# Patient Record
Sex: Female | Born: 1995 | Race: Black or African American | Hispanic: No | Marital: Single | Smoking: Never smoker
Health system: Southern US, Community
[De-identification: ages and names within clinical notes are randomized; demographics above are authoritative.]

## PROBLEM LIST (undated history)

## (undated) DIAGNOSIS — J439 Emphysema, unspecified: Secondary | ICD-10-CM

## (undated) DIAGNOSIS — N939 Abnormal uterine and vaginal bleeding, unspecified: Secondary | ICD-10-CM

## (undated) DIAGNOSIS — T7840XA Allergy, unspecified, initial encounter: Secondary | ICD-10-CM

## (undated) DIAGNOSIS — D696 Thrombocytopenia, unspecified: Secondary | ICD-10-CM

## (undated) DIAGNOSIS — Z87898 Personal history of other specified conditions: Secondary | ICD-10-CM

## (undated) DIAGNOSIS — J45909 Unspecified asthma, uncomplicated: Secondary | ICD-10-CM

## (undated) DIAGNOSIS — J449 Chronic obstructive pulmonary disease, unspecified: Secondary | ICD-10-CM

## (undated) DIAGNOSIS — H9190 Unspecified hearing loss, unspecified ear: Secondary | ICD-10-CM

## (undated) DIAGNOSIS — Z8774 Personal history of (corrected) congenital malformations of heart and circulatory system: Secondary | ICD-10-CM

## (undated) DIAGNOSIS — Q211 Atrial septal defect, unspecified: Secondary | ICD-10-CM

## (undated) DIAGNOSIS — Q2549 Other congenital malformations of aorta: Secondary | ICD-10-CM

## (undated) DIAGNOSIS — I272 Pulmonary hypertension, unspecified: Secondary | ICD-10-CM

## (undated) DIAGNOSIS — R7303 Prediabetes: Secondary | ICD-10-CM

## (undated) DIAGNOSIS — Q210 Ventricular septal defect: Secondary | ICD-10-CM

## (undated) DIAGNOSIS — J984 Other disorders of lung: Secondary | ICD-10-CM

## (undated) DIAGNOSIS — D6942 Congenital and hereditary thrombocytopenia purpura: Secondary | ICD-10-CM

## (undated) DIAGNOSIS — Z5189 Encounter for other specified aftercare: Secondary | ICD-10-CM

## (undated) DIAGNOSIS — Q2543 Congenital aneurysm of aorta: Secondary | ICD-10-CM

## (undated) DIAGNOSIS — F819 Developmental disorder of scholastic skills, unspecified: Secondary | ICD-10-CM

## (undated) HISTORY — DX: Prediabetes: R73.03

## (undated) HISTORY — PX: PATENT DUCTUS ARTERIOUS REPAIR: SHX269

## (undated) HISTORY — DX: Encounter for other specified aftercare: Z51.89

## (undated) HISTORY — DX: Abnormal uterine and vaginal bleeding, unspecified: N93.9

## (undated) HISTORY — PX: VSD REPAIR: SHX276

## (undated) HISTORY — PX: CARDIAC SURGERY: SHX584

## (undated) HISTORY — DX: Congenital and hereditary thrombocytopenia purpura: D69.42

## (undated) HISTORY — DX: Other disorders of lung: J98.4

## (undated) HISTORY — DX: Personal history of other specified conditions: Z87.898

## (undated) HISTORY — PX: EYE SURGERY: SHX253

---
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
## 2014-04-20 LAB — PULMONARY FUNCTION TEST

## 2015-02-14 ENCOUNTER — Emergency Department (HOSPITAL_COMMUNITY)
Admission: EM | Admit: 2015-02-14 | Discharge: 2015-02-14 | Disposition: A | Discharge status: Home / Self Care | Payer: Self-pay | Attending: Emergency Medicine | Admitting: Emergency Medicine

## 2015-02-14 ENCOUNTER — Emergency Department (HOSPITAL_COMMUNITY)
Admission: EM | Admit: 2015-02-14 | Discharge: 2015-02-14 | Discharge status: Against Medical Advice | Payer: Self-pay | Attending: Emergency Medicine | Admitting: Emergency Medicine

## 2015-02-14 ENCOUNTER — Encounter (HOSPITAL_COMMUNITY): Payer: Self-pay | Admitting: *Deleted

## 2015-02-14 ENCOUNTER — Emergency Department (HOSPITAL_COMMUNITY): Payer: Self-pay

## 2015-02-14 ENCOUNTER — Encounter (HOSPITAL_COMMUNITY): Payer: Self-pay

## 2015-02-14 DIAGNOSIS — R0981 Nasal congestion: Secondary | ICD-10-CM | POA: Insufficient documentation

## 2015-02-14 DIAGNOSIS — R05 Cough: Secondary | ICD-10-CM | POA: Insufficient documentation

## 2015-02-14 DIAGNOSIS — J449 Chronic obstructive pulmonary disease, unspecified: Secondary | ICD-10-CM | POA: Insufficient documentation

## 2015-02-14 DIAGNOSIS — Z79818 Long term (current) use of other agents affecting estrogen receptors and estrogen levels: Secondary | ICD-10-CM | POA: Insufficient documentation

## 2015-02-14 DIAGNOSIS — Z79899 Other long term (current) drug therapy: Secondary | ICD-10-CM | POA: Insufficient documentation

## 2015-02-14 DIAGNOSIS — Z862 Personal history of diseases of the blood and blood-forming organs and certain disorders involving the immune mechanism: Secondary | ICD-10-CM | POA: Insufficient documentation

## 2015-02-14 DIAGNOSIS — J069 Acute upper respiratory infection, unspecified: Secondary | ICD-10-CM | POA: Insufficient documentation

## 2015-02-14 HISTORY — DX: Chronic obstructive pulmonary disease, unspecified: J44.9

## 2015-02-14 HISTORY — DX: Emphysema, unspecified: J43.9

## 2015-02-14 HISTORY — DX: Thrombocytopenia, unspecified: D69.6

## 2015-02-14 LAB — COMPREHENSIVE METABOLIC PANEL
ALK PHOS: 29 U/L — AB (ref 38–126)
ALT: 22 U/L (ref 14–54)
AST: 27 U/L (ref 15–41)
Albumin: 3.9 g/dL (ref 3.5–5.0)
Anion gap: 9 (ref 5–15)
BILIRUBIN TOTAL: 1.3 mg/dL — AB (ref 0.3–1.2)
BUN: 6 mg/dL (ref 6–20)
CHLORIDE: 102 mmol/L (ref 101–111)
CO2: 24 mmol/L (ref 22–32)
CREATININE: 0.79 mg/dL (ref 0.44–1.00)
Calcium: 9.5 mg/dL (ref 8.9–10.3)
GFR calc Af Amer: >60 mL/min (ref >60)
GLUCOSE: 92 mg/dL (ref 65–99)
Potassium: 4 mmol/L (ref 3.5–5.1)
Sodium: 135 mmol/L (ref 135–145)
Total Protein: 7.9 g/dL (ref 6.5–8.1)

## 2015-02-14 LAB — CBC
HEMATOCRIT: 40.2 % (ref 36.0–46.0)
HEMOGLOBIN: 13.3 g/dL (ref 12.0–15.0)
MCH: 30.4 pg (ref 26.0–34.0)
MCHC: 33.1 g/dL (ref 30.0–36.0)
MCV: 92 fL (ref 78.0–100.0)
Platelets: 92 10*3/uL — ABNORMAL LOW (ref 150–400)
RBC: 4.37 MIL/uL (ref 3.87–5.11)
RDW: 12.6 % (ref 11.5–15.5)
WBC: 9 10*3/uL (ref 4.0–10.5)

## 2015-02-14 MED ORDER — FLUTICASONE PROPIONATE 50 MCG/ACT NA SUSP: 2.0000 | Freq: Every day | NASAL | Status: DC

## 2015-02-14 MED ORDER — CETIRIZINE HCL 1 MG/ML PO SYRP: 2.5000 mg | ORAL_SOLUTION | Freq: Every day | ORAL | Status: DC

## 2015-02-14 MED ORDER — CETIRIZINE HCL 10 MG PO TABS: 10.0000 mg | ORAL_TABLET | Freq: Every day | ORAL | Status: DC

## 2015-02-14 NOTE — ED Notes (Signed)
Per mother pt was at [facility] today & left after an x ray today

## 2015-02-14 NOTE — ED Notes (Signed)
Pt states congestion/cough x 3 days.  Pt has tried benadryl, claritin.  Remains congested.  No fever.

## 2015-02-14 NOTE — ED Provider Notes (Signed)
Complains of cough, sneeze, rhinorrhea onset 3 days ago which is improving with time. She denies shortness of breath presently. Denies fever. No other associated symptoms  Orlie Dakin, MD 02/14/15 2107

## 2015-02-14 NOTE — ED Provider Notes (Signed)
CSN: [id number]     Arrival date & time 02/14/15  1726 History   First MD Initiated Contact with Patient 02/14/15 1917     Chief Complaint  Patient presents with  . Sore Throat  . Cough   Tammie Wade is a 19 y.o. female with a history of emphysema, COPD, thrombocytopenia and bronchopulmonary dysplasia who presents the emergency department with her mother and father complaining of cough, sore throat, postnasal drip, sneezing, and nasal congestion. She reports she has tried Claritin and Benadryl without relief. She reports some slight shortness of breath with coughing. She denies trouble swallowing. She reports she's been using her Spiriva as prescribed. She denies fevers, chills, appetite changes, abdominal pain, nausea, vomiting, diarrhea, double vision, ear pain, ear discharge, rashes, chest pain, palpitations, lightheadedness, dizziness or syncope.  (Consider location/radiation/quality/duration/timing/severity/associated sxs/prior Treatment) HPI  Past Medical History  Diagnosis Date  . Emphysema of lung ([place])   . COPD (chronic obstructive pulmonary disease) ([place])   . Thrombocytopenia ([place])   . Bronchopulmonary dysplasia    Past Surgical History  Procedure Laterality Date  . Cardiac surgery    . Vsd repair    . Eye surgery     No family history on file. Social History  Substance Use Topics  . Smoking status: Never Smoker   . Smokeless tobacco: None  . Alcohol Use: No   OB History    No data available     Review of Systems  Constitutional: Negative for fever, chills and appetite change.  HENT: Positive for congestion, postnasal drip, rhinorrhea, sinus pressure, sneezing and sore throat. Negative for ear discharge, ear pain, nosebleeds and trouble swallowing.   Eyes: Negative for pain, discharge, itching and visual disturbance.  Respiratory: Positive for cough. Negative for chest tightness and wheezing.   Cardiovascular: Negative for chest pain and palpitations.   Gastrointestinal: Negative for nausea, vomiting, abdominal pain and diarrhea.  Genitourinary: Negative for dysuria.  Musculoskeletal: Negative for back pain and neck pain.  Skin: Negative for rash.  Neurological: Negative for dizziness, syncope, weakness, light-headedness and numbness.      Allergies  Review of patient's allergies indicates no known allergies.  Home Medications   Prior to Admission medications   Medication Sig Start Date End Date Taking? Authorizing Provider  LOW-OGESTREL 0.3-30 MG-MCG tablet Take 1 tablet by mouth daily. 01/30/15  Yes Historical Provider, MD  omeprazole (PRILOSEC) 40 MG capsule Take 40 mg by mouth daily. 01/30/15  Yes Historical Provider, MD  SPIRIVA RESPIMAT 2.5 MCG/ACT AERS Inhale 2 puffs into the lungs daily. 01/31/15  Yes Historical Provider, MD  cetirizine (ZYRTEC ALLERGY) 10 MG tablet Take 1 tablet (10 mg total) by mouth daily. 02/14/15   Waynetta Pean, PA-C  fluticasone (FLONASE) 50 MCG/ACT nasal spray Place 2 sprays into both nostrils daily. 02/14/15   Waynetta Pean, PA-C   BP 101/68 mmHg  Pulse 107  Temp(Src) 98.6 F (37 C) (Oral)  Resp 18  Ht _0  (1.651 m)  Wt 130 lb (58.968 kg)  BMI 21.63 kg/m2  SpO2 98%  LMP 02/04/2015 Physical Exam  Constitutional: She appears well-developed and well-nourished. No distress.  Nontoxic appearing.  HENT:  Head: Normocephalic and atraumatic.  Right Ear: External ear normal.  Left Ear: External ear normal.  Mouth/Throat: Oropharynx is clear and moist.  Mild bilateral tonsillar hypertrophy without exudates. Uvula is midline without edema. No peritonsillar abscess. No posterior oropharyngeal edema. Boggy nasal turbinates bilaterally. Mild maxillary and frontal sinus tenderness to palpation. Bilateral tympanic membranes  are pearly-gray without erythema or loss of landmarks.   Eyes: Conjunctivae are normal. Pupils are equal, round, and reactive to light. Right eye exhibits no discharge. Left eye  exhibits no discharge.  Neck: Normal range of motion. Neck supple.  Cardiovascular: Normal rate, regular rhythm and intact distal pulses.  Exam reveals no gallop and no friction rub.   Pulmonary/Chest: Effort normal and breath sounds normal. No respiratory distress. She has no wheezes. She has no rales.  Lungs are clear to auscultation bilaterally.  Abdominal: Soft. She exhibits no distension. There is no tenderness. There is no guarding.  Musculoskeletal: She exhibits no edema or tenderness.  Lymphadenopathy:    She has no cervical adenopathy.  Neurological: She is alert. Coordination normal.  Skin: Skin is warm and dry. No rash noted. She is not diaphoretic. No erythema. No pallor.  Psychiatric: She has a normal mood and affect. Her behavior is normal.  Nursing note and vitals reviewed.   ED Course  Procedures (including critical care time) Labs Review Labs Reviewed  CBC - Abnormal; Notable for the following:    Platelets 92 (*)    All other components within normal limits  COMPREHENSIVE METABOLIC PANEL - Abnormal; Notable for the following:    Alkaline Phosphatase 29 (*)    Total Bilirubin 1.3 (*)    All other components within normal limits    Imaging Review Dg Chest 2 View  02/14/2015  CLINICAL DATA:  19 year old with 3 day history of cough and chest congestion unrelieved by over the counter medications. 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 EXAM: CHEST  2 VIEW COMPARISON:  None. FINDINGS: Cardiac silhouette upper normal in size with likely left atrial enlargement. Hilar and mediastinal contours otherwise unremarkable. Severe bullous changes throughout both lungs. No confluent airspace consolidation. No pleural effusions. No pneumothorax. Visualized bony thorax intact. IMPRESSION: 1.  No acute cardiopulmonary disease. 2. Severe bullous emphysematous changes throughout both lungs. 3. Upper normal heart size.  Query left atrial  enlargement. Electronically Signed   By: Evangeline Dakin M.D.   On: 02/14/2015 13:45   I have personally reviewed and evaluated these images and lab results as part of my medical decision-making.   EKG Interpretation None      Filed Vitals:   02/14/15 1858 02/14/15 1900 02/14/15 1915 02/14/15 2015  BP: 99/59 99/59 105/66 101/68  Pulse: 92 65 97 107  Temp:      TempSrc:      Resp: 18     Height:      Weight:      SpO2: 100% 98% 98% 98%     MDM   Meds given in ED:  Medications - No data to display  New Prescriptions   CETIRIZINE (ZYRTEC ALLERGY) 10 MG TABLET    Take 1 tablet (10 mg total) by mouth daily.   FLUTICASONE (FLONASE) 50 MCG/ACT NASAL SPRAY    Place 2 sprays into both nostrils daily.    Final diagnoses:  URI (upper respiratory infection)   This is a 19 y.o. female with a history of emphysema, COPD, thrombocytopenia and bronchopulmonary dysplasia who presents the emergency department with her mother and father complaining of cough, sore throat, postnasal drip, sneezing, and nasal congestion. She reports she has tried Claritin and Benadryl without relief. She reports some slight shortness of breath with coughing. She denies trouble swallowing. She reports she's been using her Spiriva as prescribed. On exam the patient is afebrile nontoxic appearing. Her lungs  are clear to auscultation bilaterally. Patient has boggy nasal turbinates bilaterally. She is not tonsillar protrude without exudates. CBC shows thrombocytopenia. This is chronic and old. CMP is unremarkable. Chest x-ray showed no acute cardiopulmonary disease. It did show eczematous changes throughout with sedation has a history of. Patient appears to have an upper respiratory infection. We will discharge the projections for cetirizine and fluticasone nasal spray. I encouraged close follow-up by primary care and advised strict return precautions. I advised the patient to follow-up with their primary care provider this  week. I advised the patient to return to the emergency department with new or worsening symptoms or new concerns. The patient verbalized understanding and agreement with plan.    This patient was discussed with and evaluated by Dr. Winfred Leeds who agrees with assessment and plan.    Waynetta Pean, PA-C 02/14/15 2114  Orlie Dakin, MD 02/15/15 [id number]

## 2015-02-14 NOTE — Discharge Instructions (Signed)
Upper Respiratory Infection, Adult Most upper respiratory infections (URIs) are a viral infection of the air passages leading to the lungs. A URI affects the nose, throat, and upper air passages. The most common type of URI is nasopharyngitis and is typically referred to as "the common cold." URIs run their course and usually go away on their own. Most of the time, a URI does not require medical attention, but sometimes a bacterial infection in the upper airways can follow a viral infection. This is called a secondary infection. Sinus and middle ear infections are common types of secondary upper respiratory infections. Bacterial pneumonia can also complicate a URI. A URI can worsen asthma and chronic obstructive pulmonary disease (COPD). Sometimes, these complications can require emergency medical care and may be life threatening.  CAUSES Almost all URIs are caused by viruses. A virus is a type of germ and can spread from one person to another.  RISKS FACTORS You may be at risk for a URI if:   You smoke.   You have chronic heart or lung disease.  You have a weakened defense (immune) system.   You are very young or very old.   You have nasal allergies or asthma.  You work in crowded or poorly ventilated areas.  You work in health care facilities or schools. SIGNS AND SYMPTOMS  Symptoms typically develop 2-3 days after you come in contact with a cold virus. Most viral URIs last 7-10 days. However, viral URIs from the influenza virus (flu virus) can last 14-18 days and are typically more severe. Symptoms may include:   Runny or stuffy (congested) nose.   Sneezing.   Cough.   Sore throat.   Headache.   Fatigue.   Fever.   Loss of appetite.   Pain in your forehead, behind your eyes, and over your cheekbones (sinus pain).  Muscle aches.  DIAGNOSIS  Your health care provider may diagnose a URI by:  Physical exam.  Tests to check that your symptoms are not due to  another condition such as:  Strep throat.  Sinusitis.  Pneumonia.  Asthma. TREATMENT  A URI goes away on its own with time. It cannot be cured with medicines, but medicines may be prescribed or recommended to relieve symptoms. Medicines may help:  Reduce your fever.  Reduce your cough.  Relieve nasal congestion. HOME CARE INSTRUCTIONS   Take medicines only as directed by your health care provider.   Gargle warm saltwater or take cough drops to comfort your throat as directed by your health care provider.  Use a warm mist humidifier or inhale steam from a shower to increase air moisture. This may make it easier to breathe.  Drink enough fluid to keep your urine clear or pale yellow.   Eat soups and other clear broths and maintain good nutrition.   Rest as needed.   Return to work when your temperature has returned to normal or as your health care provider advises. You may need to stay home longer to avoid infecting others. You can also use a face mask and careful hand washing to prevent spread of the virus.  Increase the usage of your inhaler if you have asthma.   Do not use any tobacco products, including cigarettes, chewing tobacco, or electronic cigarettes. If you need help quitting, ask your health care provider. PREVENTION  The best way to protect yourself from getting a cold is to practice good hygiene.   Avoid oral or hand contact with people with cold  symptoms.   Wash your hands often if contact occurs.  There is no clear evidence that vitamin C, vitamin E, echinacea, or exercise reduces the chance of developing a cold. However, it is always recommended to get plenty of rest, exercise, and practice good nutrition.  SEEK MEDICAL CARE IF:   You are getting worse rather than better.   Your symptoms are not controlled by medicine.   You have chills.  You have worsening shortness of breath.  You have brown or red mucus.  You have yellow or brown nasal  discharge.  You have pain in your face, especially when you bend forward.  You have a fever.  You have swollen neck glands.  You have pain while swallowing.  You have Detjen areas in the back of your throat. SEEK IMMEDIATE MEDICAL CARE IF:   You have severe or persistent:  Headache.  Ear pain.  Sinus pain.  Chest pain.  You have chronic lung disease and any of the following:  Wheezing.  Prolonged cough.  Coughing up blood.  A change in your usual mucus.  You have a stiff neck.  You have changes in your:  Vision.  Hearing.  Thinking.  Mood. MAKE SURE YOU:   Understand these instructions.  Will watch your condition.  Will get help right away if you are not doing well or get worse.   This information is not intended to replace advice given to you by your health care provider. Make sure you discuss any questions you have with your health care provider.   Document Released: 09/16/2000 Document Revised: 08/07/2014 Document Reviewed: 06/28/2013 Elsevier Interactive Patient Education 2016 [employer]. Allergic Rhinitis Allergic rhinitis is when the mucous membranes in the nose respond to allergens. Allergens are particles in the air that cause your body to have an allergic reaction. This causes you to release allergic antibodies. Through a chain of events, these eventually cause you to release histamine into the blood stream. Although meant to protect the body, it is this release of histamine that causes your discomfort, such as frequent sneezing, congestion, and an itchy, runny nose.  CAUSES Seasonal allergic rhinitis (hay fever) is caused by pollen allergens that may come from grasses, trees, and weeds. Year-round allergic rhinitis (perennial allergic rhinitis) is caused by allergens such as house dust mites, pet dander, and mold spores. SYMPTOMS  Nasal stuffiness (congestion).  Itchy, runny nose with sneezing and tearing of the eyes. DIAGNOSIS Your health  care provider can help you determine the allergen or allergens that trigger your symptoms. If you and your health care provider are unable to determine the allergen, skin or blood testing may be used. Your health care provider will diagnose your condition after taking your health history and performing a physical exam. Your health care provider may assess you for other related conditions, such as asthma, pink eye, or an ear infection. TREATMENT Allergic rhinitis does not have a cure, but it can be controlled by:  Medicines that block allergy symptoms. These may include allergy shots, nasal sprays, and oral antihistamines.  Avoiding the allergen. Hay fever may often be treated with antihistamines in pill or nasal spray forms. Antihistamines block the effects of histamine. There are over-the-counter medicines that may help with nasal congestion and swelling around the eyes. Check with your health care provider before taking or giving this medicine. If avoiding the allergen or the medicine prescribed do not work, there are many new medicines your health care provider can prescribe. Stronger medicine  may be used if initial measures are ineffective. Desensitizing injections can be used if medicine and avoidance does not work. Desensitization is when a patient is given ongoing shots until the body becomes less sensitive to the allergen. Make sure you follow up with your health care provider if problems continue. HOME CARE INSTRUCTIONS It is not possible to completely avoid allergens, but you can reduce your symptoms by taking steps to limit your exposure to them. It helps to know exactly what you are allergic to so that you can avoid your specific triggers. SEEK MEDICAL CARE IF:  You have a fever.  You develop a cough that does not stop easily (persistent).  You have shortness of breath.  You start wheezing.  Symptoms interfere with normal daily activities.   This information is not intended to  replace advice given to you by your health care provider. Make sure you discuss any questions you have with your health care provider.   Document Released: 12/16/2000 Document Revised: 04/13/2014 Document Reviewed: 11/28/2012 Elsevier Interactive Patient Education [employer].

## 2015-02-14 NOTE — ED Notes (Signed)
Called for pt.  Pt not found in lobby.  Per pt sitting next to her, pt went to another hospital and did not want to wait.

## 2015-06-27 ENCOUNTER — Telehealth: Payer: Self-pay | Admitting: Family Medicine

## 2015-06-27 NOTE — Telephone Encounter (Signed)
Records received from [facility] put on Vickie's desk

## 2015-06-28 ENCOUNTER — Encounter: Payer: Self-pay | Admitting: Family Medicine

## 2015-06-28 ENCOUNTER — Ambulatory Visit (INDEPENDENT_AMBULATORY_CARE_PROVIDER_SITE_OTHER): Payer: BLUE CROSS/BLUE SHIELD | Admitting: Family Medicine

## 2015-06-28 VITALS — BP 128/70 | HR 64 | Wt 145.4 lb

## 2015-06-28 DIAGNOSIS — Z7189 Other specified counseling: Secondary | ICD-10-CM

## 2015-06-28 DIAGNOSIS — Z3041 Encounter for surveillance of contraceptive pills: Secondary | ICD-10-CM | POA: Diagnosis not present

## 2015-06-28 DIAGNOSIS — Z7689 Persons encountering health services in other specified circumstances: Secondary | ICD-10-CM

## 2015-06-28 MED ORDER — LOW-OGESTREL 0.3-30 MG-MCG PO TABS: 1.0000 | ORAL_TABLET | Freq: Every day | ORAL | Status: DC

## 2015-06-28 NOTE — Progress Notes (Signed)
   Subjective:    Patient ID: [place], female    DOB: [birth year], 20 y.o.   MRN: [id number]  HPI Chief Complaint  Patient presents with  . new pt    get established  and get bcp refilled.   She is new to the practice and here to establish primary care. She is here with her mother. She moved here from [facility] in October 2016. No primary care since moving here. Prior to that she was living in [place] until 2014. She has been under the care of multiple specialist in past while she was living in [place] including a hematologist, gynecologist, pulmonologist, cardiologist, audiologist- hearing loss. She has been taking the same birth control pill for several years. States she needs to be on this to keep from bleeding so much due to history of thrombocytopenia. Has been on a steroid in past also but was discontinued in [facility].  Mother reports patient has complicated history including developmental delay. Mother is doing most of speaking but patient is involved in interview somewhat.   Also has history of Bronchial pulmonary displasia per mother.  Diagnosed with COPD - emphysema last year- Dr Janace Hoard in [facility] diagnosed last year. Saw Pulmonologist last year  History of thrombocytopenia.  Patient has appt for CPE and labs next week and today mother states she is here to refill her OCP so that she does not start her menstrual cycle.  Some of her medical records have arrived and will be further reviewed.   She has no other concerns today. Denies fever, chills, nausea, vomiting, diarrhea, abnormal bleeding, urinary symptoms.   Review of Systems Pertinent positives and negatives in the history of present illness.     Objective:   Physical Exam BP 128/70 mmHg  Pulse 64  Wt 145 lb 6.4 oz (65.953 kg)  LMP 06/23/2015  Alert and oriented and in no acute distress. Not otherwise examined.       Assessment & Plan:  Encounter for repeat prescription of oral contraceptives - Plan: LOW-OGESTREL 0.3-30 MG-MCG  tablet  Encounter to establish care  Refilled OCP per request of mother and patient. No other concerns today. Will need to review medical records and have her return next week for CPE and labs. Based on history obtained today I suspect that patient will need referral to pulmonologist and hematologist but will need to discuss this further next week.

## 2015-07-02 ENCOUNTER — Encounter: Payer: Self-pay | Admitting: Family Medicine

## 2015-07-02 ENCOUNTER — Ambulatory Visit (INDEPENDENT_AMBULATORY_CARE_PROVIDER_SITE_OTHER): Payer: BLUE CROSS/BLUE SHIELD | Admitting: Family Medicine

## 2015-07-02 VITALS — BP 116/70 | HR 64 | Ht 64.5 in | Wt 142.8 lb

## 2015-07-02 DIAGNOSIS — R5383 Other fatigue: Secondary | ICD-10-CM | POA: Diagnosis not present

## 2015-07-02 DIAGNOSIS — J984 Other disorders of lung: Secondary | ICD-10-CM | POA: Diagnosis not present

## 2015-07-02 DIAGNOSIS — Z0000 Encounter for general adult medical examination without abnormal findings: Secondary | ICD-10-CM | POA: Diagnosis not present

## 2015-07-02 DIAGNOSIS — Z8742 Personal history of other diseases of the female genital tract: Secondary | ICD-10-CM

## 2015-07-02 DIAGNOSIS — D696 Thrombocytopenia, unspecified: Secondary | ICD-10-CM | POA: Diagnosis not present

## 2015-07-02 DIAGNOSIS — R7303 Prediabetes: Secondary | ICD-10-CM | POA: Diagnosis not present

## 2015-07-02 DIAGNOSIS — Z8679 Personal history of other diseases of the circulatory system: Secondary | ICD-10-CM | POA: Diagnosis not present

## 2015-07-02 DIAGNOSIS — K219 Gastro-esophageal reflux disease without esophagitis: Secondary | ICD-10-CM | POA: Diagnosis not present

## 2015-07-02 DIAGNOSIS — Z3041 Encounter for surveillance of contraceptive pills: Secondary | ICD-10-CM

## 2015-07-02 LAB — POCT URINALYSIS DIPSTICK
Bilirubin, UA: NEGATIVE
GLUCOSE UA: NEGATIVE
KETONES UA: NEGATIVE
Nitrite, UA: NEGATIVE
Spec Grav, UA: 1.025
UROBILINOGEN UA: 2
pH, UA: 6

## 2015-07-02 MED ORDER — ALBUTEROL SULFATE HFA 108 (90 BASE) MCG/ACT IN AERS: 2.0000 | INHALATION_SPRAY | Freq: Four times a day (QID) | RESPIRATORY_TRACT | Status: DC | PRN

## 2015-07-02 NOTE — Patient Instructions (Addendum)
I am referring you today to the Pulmonologist. They will call you for an appointment. I also prescribed an albuterol inhaler to use as needed but not daily use. You should have this on hand in case of wheezing or shortness of breath.  I am referring you to Gynecologist due to complicated history of menstrual bleeding and thrombocytopenia. They can follow up on HPV and Pap smear as we discussed.  We will get lab results and then may need to refer to hematologist.  Scheduled appointment for dental exam. Switch from Zyrtec to Claritin or Allegra since the Zyrtec seems to make her sleepy.  Preventative Care for Adults - Female      MAINTAIN REGULAR HEALTH EXAMS:  A routine yearly physical is a good way to check in with your primary care provider about your health and preventive screening. It is also an opportunity to share updates about your health and any concerns you have, and receive a thorough all-over exam.   Most health insurance companies pay for at least some preventative services.  Check with your health plan for specific coverages.  WHAT PREVENTATIVE SERVICES DO WOMEN NEED?  Adult women should have their weight and blood pressure checked regularly.   Women age 60 and older should have their cholesterol levels checked regularly.  Women should be screened for cervical cancer with a Pap smear and pelvic exam beginning at either age 55, or 3 years after they become sexually activity.    Breast cancer screening generally begins at age 57 with a mammogram and breast exam by your primary care provider.    Beginning at age 68 and continuing to age 37, women should be screened for colorectal cancer.  Certain people may need continued testing until age 77.  Updating vaccinations is part of preventative care.  Vaccinations help protect against diseases such as the flu.  Osteoporosis is a disease in which the bones lose minerals and strength as we age. Women ages 8 and over should discuss this  with their caregivers, as should women after menopause who have other risk factors.  Lab tests are generally done as part of preventative care to screen for anemia and blood disorders, to screen for problems with the kidneys and liver, to screen for bladder problems, to check blood sugar, and to check your cholesterol level.  Preventative services generally include counseling about diet, exercise, avoiding tobacco, drugs, excessive alcohol consumption, and sexually transmitted infections.    GENERAL RECOMMENDATIONS FOR GOOD HEALTH:  Healthy diet:  Eat a variety of foods, including fruit, vegetables, animal or vegetable protein, such as meat, fish, chicken, and eggs, or beans, lentils, tofu, and grains, such as rice.  Drink plenty of water daily.  Decrease saturated fat in the diet, avoid lots of red meat, processed foods, sweets, fast foods, and fried foods.  Exercise:  Aerobic exercise helps maintain good heart health. At least 30-40 minutes of moderate-intensity exercise is recommended. For example, a brisk walk that increases your heart rate and breathing. This should be done on most days of the week.   Find a type of exercise or a variety of exercises that you enjoy so that it becomes a part of your daily life.  Examples are running, walking, swimming, water aerobics, and biking.  For motivation and support, explore group exercise such as aerobic class, spin class, Zumba, Yoga,or  martial arts, etc.    Set exercise goals for yourself, such as a certain weight goal, walk or run in a race  such as a 5k walk/run.  Speak to your primary care provider about exercise goals.  Disease prevention:  If you smoke or chew tobacco, find out from your caregiver how to quit. It can literally save your life, no matter how long you have been a tobacco user. If you do not use tobacco, never begin.   Maintain a healthy diet and normal weight. Increased weight leads to problems with blood pressure and  diabetes.   The Body Mass Index or BMI is a way of measuring how much of your body is fat. Having a BMI above 27 increases the risk of heart disease, diabetes, hypertension, stroke and other problems related to obesity. Your caregiver can help determine your BMI and based on it develop an exercise and dietary program to help you achieve or maintain this important measurement at a healthful level.  High blood pressure causes heart and blood vessel problems.  Persistent high blood pressure should be treated with medicine if weight loss and exercise do not work.   Fat and cholesterol leaves deposits in your arteries that can block them. This causes heart disease and vessel disease elsewhere in your body.  If your cholesterol is found to be high, or if you have heart disease or certain other medical conditions, then you may need to have your cholesterol monitored frequently and be treated with medication.   Ask if you should have a cardiac stress test if your history suggests this. A stress test is a test done on a treadmill that looks for heart disease. This test can find disease prior to there being a problem.  Menopause can be associated with physical symptoms and risks. Hormone replacement therapy is available to decrease these. You should talk to your caregiver about whether starting or continuing to take hormones is right for you.   Osteoporosis is a disease in which the bones lose minerals and strength as we age. This can result in serious bone fractures. Risk of osteoporosis can be identified using a bone density scan. Women ages 41 and over should discuss this with their caregivers, as should women after menopause who have other risk factors. Ask your caregiver whether you should be taking a calcium supplement and Vitamin D, to reduce the rate of osteoporosis.   Avoid drinking alcohol in excess (more than two drinks per day).  Avoid use of street drugs. Do not share needles with anyone. Ask for  professional help if you need assistance or instructions on stopping the use of alcohol, cigarettes, and/or drugs.  Brush your teeth twice a day with fluoride toothpaste, and floss once a day. Good oral hygiene prevents tooth decay and gum disease. The problems can be painful, unattractive, and can cause other health problems. Visit your dentist for a routine oral and dental check up and preventive care every 6-12 months.   Look at your skin regularly.  Use a mirror to look at your back. Notify your caregivers of changes in moles, especially if there are changes in shapes, colors, a size larger than a pencil eraser, an irregular border, or development of new moles.  Safety:  Use seatbelts 100% of the time, whether driving or as a passenger.  Use safety devices such as hearing protection if you work in environments with loud noise or significant background noise.  Use safety glasses when doing any work that could send debris in to the eyes.  Use a helmet if you ride a bike or motorcycle.  Use appropriate safety gear  for contact sports.  Talk to your caregiver about gun safety.  Use sunscreen with a SPF (or skin protection factor) of 15 or greater.  Lighter skinned people are at a greater risk of skin cancer. Don't forget to also wear sunglasses in order to protect your eyes from too much damaging sunlight. Damaging sunlight can accelerate cataract formation.   Practice safe sex. Use condoms. Condoms are used for birth control and to help reduce the spread of sexually transmitted infections (or STIs).  Some of the STIs are gonorrhea (the clap), chlamydia, syphilis, trichomonas, herpes, HPV (human papilloma virus) and HIV (human immunodeficiency virus) which causes AIDS. The herpes, HIV and HPV are viral illnesses that have no cure. These can result in disability, cancer and death.   Keep carbon monoxide and smoke detectors in your home functioning at all times. Change the batteries every 6 months or use  a model that plugs into the wall.   Vaccinations:  Stay up to date with your tetanus shots and other required immunizations. You should have a booster for tetanus every 10 years. Be sure to get your flu shot every year, since 5%-20% of the [place] population comes down with the flu. The flu vaccine changes each year, so being vaccinated once is not enough. Get your shot in the fall, before the flu season peaks.   Other vaccines to consider:  Human Papilloma Virus or HPV causes cancer of the cervix, and other infections that can be transmitted from person to person. There is a vaccine for HPV, and females should get immunized between the ages of 37 and 24. It requires a series of 3 shots.   Pneumococcal vaccine to protect against certain types of pneumonia.  This is normally recommended for adults age 103 or older.  However, adults younger than 22 years old with certain underlying conditions such as diabetes, heart or lung disease should also receive the vaccine.  Shingles vaccine to protect against Varicella Zoster if you are older than age 62, or younger than 22 years old with certain underlying illness.  Hepatitis A vaccine to protect against a form of infection of the liver by a virus acquired from food.  Hepatitis B vaccine to protect against a form of infection of the liver by a virus acquired from blood or body fluids, particularly if you work in health care.  If you plan to travel internationally, check with your local health department for specific vaccination recommendations.  Cancer Screening:  Breast cancer screening is essential to preventive care for women. All women age 36 and older should perform a breast self-exam every month. At age 15 and older, women should have their caregiver complete a breast exam each year. Women at ages 62 and older should have a mammogram (x-ray film) of the breasts. Your caregiver can discuss how often you need mammograms.    Cervical cancer screening  includes taking a Pap smear (sample of cells examined under a microscope) from the cervix (end of the uterus). It also includes testing for HPV (Human Papilloma Virus, which can cause cervical cancer). Screening and a pelvic exam should begin at age 37, or 3 years after a woman becomes sexually active. Screening should occur every year, with a Pap smear but no HPV testing, up to age 80. After age 54, you should have a Pap smear every 3 years with HPV testing, if no HPV was found previously.   Most routine colon cancer screening begins at the age of 86. On  a yearly basis, doctors may provide special easy to use take-home tests to check for hidden blood in the stool. Sigmoidoscopy or colonoscopy can detect the earliest forms of colon cancer and is life saving. These tests use a small camera at the end of a tube to directly examine the colon. Speak to your caregiver about this at age 35, when routine screening begins (and is repeated every 5 years unless early forms of pre-cancerous polyps or small growths are found).

## 2015-07-02 NOTE — Progress Notes (Signed)
Subjective:    Patient ID: [place], female    DOB: 09/22/1995, 20 y.o.   MRN: [id number]  HPI Chief Complaint  Patient presents with  . physical    physical with possible pap, cough. eye exam 3 weeks ago    She is a 20 year old African-American female with a history of developmental delay, thrombocytopenia, chronic lung disease, pulmonary hypertension, bronchopulmonary dysplasia,  GERD, right sided hearing loss, and elevated A1c in past 2 years. She is here today for a physical exam and fasting blood work. She is here with her mother. They relocated from [place] where she had a primary care physician and was also referred to cardiology, pulmonology, gynecology and hematology. Mother states patient did see cardiologist in [facility] and she had EKG and some other testing that she is unsure of and told she should follow up in 1 year. She reports she also saw pulmonologist who diagnosed patient with COPD and emphysema.  She also had a gynecologist who cared for her in [place]. She also states she saw a hematologist in [facility] but only for 1 visit and they did not follow up.   I have records from primary care in [place] however nothing previous to this and no records from specialists. Mother states they moved from [place] to [place] in 2014.  Mother states she was born at term and her neonatal history was significant for having meconium aspiration and respiratory distress. She received care from [facility]. Mother states that patient typically saw her cardiologist in [place] at least once per year however she has not seen a cardiologist in .... She also saw a hematologist for thrombocytopenia in [place] and has not seen one since.... Mother states her platelet count has been as low as 60 in the past.  She reports a history of chronic lung disease and pulmonary hypertension. She has used albuterol and Flovent in the past and was recently switched to spiriva.  Mother states they were prescribed albuterol recently and do not have this at home.   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 She takes daily omeprazole for reflux, states symptoms are fine on this.   She is taking OCPs to control bleeding due to thrombocytopenia. Last documented platelet count was 108 in December 2015. She has a history of elevated A1c last documented A1c 5.7 in April 2016 and 6.5 in December 2015. Reviewed a chest x-ray from January 2016 that was normal.  Concerns/complaints today include: 2 day history of dry cough. Has underlying allergies- has been taking Zyrtec but states this makes her sleepy and tired.   Last physical exam: 03/2014 Labs: 07/2014 Eye exam: 3 wks ago. Wears eyeglasses and contact lenses. 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  Dentist: has been over a year.  Last Pap smear: never She is not sexually active. Mother states she had a lot of UTIs as a baby to a urethra issues. No recent UTIs.   Surgical history includes VSD and PDA repair at 4 weeks old, strabismus repair  Immunization history not on file. Mother reports  She is single, lives with her mother and mother's boyfriend. Denies smoking, alcohol use, drug use. Diet: "pretty healthy", mother cooks a lot Exercise: "nothing really" Mother trying to get her in a day program for special needs adults.   Reviewed allergies, medications, past medical, surgical, family and social history.     Review of Systems Review of Systems  Constitutional: -fever, -chills, -sweats, -unexpected weight change,-fatigue ENT: -runny nose, -ear pain, -sore throat Cardiology:  -chest pain, -palpitations, -edema Respiratory: + dry cough, -shortness of breath, -wheezing Gastroenterology: -abdominal pain, -nausea, -vomiting, -diarrhea, -constipation  Hematology: -bleeding or bruising problems Musculoskeletal: -arthralgias, -myalgias, -joint swelling, -back pain Ophthalmology: -vision  changes Urology: -dysuria, -difficulty urinating, -hematuria, -urinary frequency, -urgency Neurology: -headache, -weakness, -tingling, -numbness       Objective:   Physical Exam BP 116/70 mmHg  Pulse 64  Ht 5' 4.5" (1.638 m)  Wt 142 lb 12.8 oz (64.774 kg)  BMI 24.14 kg/m2  LMP 06/23/2015  General Appearance:    Alert, cooperative, no distress, appears stated age  Head:    Normocephalic, without obvious abnormality, atraumatic  Eyes:    PERRL, conjunctiva/corneas clear, EOM's intact, fundi    benign  Ears:    Normal TM's and external ear canals  Nose:   Nares normal, mucosa normal, no drainage or sinus   tenderness  Throat:   Lips, mucosa, and tongue normal; teeth and gums normal  Neck:   Supple, no lymphadenopathy;  thyroid:  no   enlargement/tenderness/nodules; no carotid   bruit or JVD  Back:    Spine nontender, no curvature, ROM normal, no CVA     tenderness  Lungs:     Clear to auscultation bilaterally without wheezes, rales or     ronchi; respirations unlabored  Chest Wall:    No tenderness or deformity   Heart:    Regular rate and rhythm, S1 and S2 normal, no murmur, rub   or gallop  Breast Exam:    No tenderness, masses, or nipple discharge or inversion.      No axillary lymphadenopathy  Abdomen:     Soft, non-tender, nondistended, normoactive bowel sounds,    no masses, no hepatosplenomegaly  Genitalia:    Normal external genitalia without lesions. Not otherwise examined. Pap not performed.   Rectal:    Not performed due to age<40 and no related complaints  Extremities:   No clubbing, cyanosis or edema  Pulses:   2+ and symmetric all extremities  Skin:   Skin color, texture, turgor normal, no rashes or lesions  Lymph nodes:   Cervical, supraclavicular, and axillary nodes normal  Neurologic:   CNII-XII intact, normal strength, sensation and gait; reflexes 2+ and symmetric throughout          Psych:   Normal mood, affect, hygiene and grooming.    Urinalysis dipstick: sp  grav 1.025, 1+ leuk, 1+ pro, blood 3+ (menses)      Assessment & Plan:  Routine general medical examination at a health care facility - Plan: POCT urinalysis dipstick, CBC with Differential/Platelet, Comprehensive metabolic panel, Lipid panel  Thrombocytopenia (HCC) - Plan: CBC with Differential/Platelet  BPD (bronchopulmonary dysplasia) - Plan: Ambulatory referral to Pulmonology  Chronic lung disease - Plan: albuterol (PROVENTIL HFA;VENTOLIN HFA) 108 (90 Base) MCG/ACT inhaler, Ambulatory referral to Pulmonology  Gastroesophageal reflux disease, esophagitis presence not specified  History of pulmonary hypertension - Plan: Ambulatory referral to Pulmonology  Prediabetes - Plan: Hemoglobin A1c  Other fatigue - Plan: TSH  Hx of menorrhagia - Plan: Ambulatory referral to Gynecology  Surveillance for birth control, oral contraceptives - Plan: Ambulatory referral to Gynecology  Discussed that I will check labs and look for any underlying etiologies for fatigue. Suspect this may be related to the Zyrtec. Recommend she switch from Zyrtec to Claritin or Allegra.   Referral made to pulmonology for complicated history of lung  disease. Prescribed Albuterol inhaler to use as needed for shortness of breath, cough or wheeze.  Will check platelet count and will refer to hematologist for further evaluation for thrombocytopenia. She has had a history of this since birth and I do suspect this will be present.  She has never taken Metformin or diabetes medication in past, she lost weight and saw nutritionist and made lifestyle modifications to reduce A1c from 6.5 to 5.7 over past 2 years. Will assess need for further treatment once labs are back.  Referral made to Gynecologist for further evaluation and management of menorrhagia and contraception based on history of needing multiple medications to control vaginal bleeding related to thrombocytopenia. She has taken Aygestin along with OCP in past. Currently  being managed on OCP alone.  Discussed safety and prevention.  Immunizations are not on file, mother states she is up to date.  She should schedule a dentist appointment. Up to date with eye exam. No pap smear performed as patient has never been sexually active and was visibly upset by talking about doing this today.  Follow up pending labs.

## 2015-07-03 ENCOUNTER — Telehealth: Payer: Self-pay | Admitting: Internal Medicine

## 2015-07-03 DIAGNOSIS — D691 Qualitative platelet defects: Secondary | ICD-10-CM

## 2015-07-03 LAB — COMPREHENSIVE METABOLIC PANEL
ALT: 15 U/L (ref 6–29)
AST: 19 U/L (ref 10–30)
Albumin: 4.2 g/dL (ref 3.6–5.1)
Alkaline Phosphatase: 28 U/L — ABNORMAL LOW (ref 33–115)
BUN: 10 mg/dL (ref 7–25)
CHLORIDE: 104 mmol/L (ref 98–110)
CO2: 24 mmol/L (ref 20–31)
CREATININE: 0.76 mg/dL (ref 0.50–1.10)
Calcium: 8.6 mg/dL (ref 8.6–10.2)
Glucose, Bld: 76 mg/dL (ref 65–99)
POTASSIUM: 4.2 mmol/L (ref 3.5–5.3)
SODIUM: 134 mmol/L — AB (ref 135–146)
Total Bilirubin: 1 mg/dL (ref 0.2–1.2)
Total Protein: 7.1 g/dL (ref 6.1–8.1)

## 2015-07-03 LAB — CBC WITH DIFFERENTIAL/PLATELET
BASOS PCT: 0 % (ref 0–1)
Basophils Absolute: 0 10*3/uL (ref 0.0–0.1)
EOS ABS: 0 10*3/uL (ref 0.0–0.7)
EOS PCT: 1 % (ref 0–5)
HCT: 37.5 % (ref 36.0–46.0)
Hemoglobin: 12.6 g/dL (ref 12.0–15.0)
LYMPHS ABS: 0.5 10*3/uL — AB (ref 0.7–4.0)
Lymphocytes Relative: 13 % (ref 12–46)
MCH: 30.5 pg (ref 26.0–34.0)
MCHC: 33.6 g/dL (ref 30.0–36.0)
MCV: 90.8 fL (ref 78.0–100.0)
MONO ABS: 0.8 10*3/uL (ref 0.1–1.0)
MONOS PCT: 18 % — AB (ref 3–12)
MPV: 10.8 fL (ref 8.6–12.4)
NEUTROS PCT: 68 % (ref 43–77)
Neutro Abs: 2.9 10*3/uL (ref 1.7–7.7)
PLATELETS: 82 10*3/uL — AB (ref 150–400)
RBC: 4.13 MIL/uL (ref 3.87–5.11)
RDW: 13 % (ref 11.5–15.5)
WBC: 4.2 10*3/uL (ref 4.0–10.5)

## 2015-07-03 LAB — LIPID PANEL
CHOL/HDL RATIO: 2.4 ratio (ref <=5.0)
Cholesterol: 120 mg/dL — ABNORMAL LOW (ref 125–200)
HDL: 49 mg/dL (ref >=46)
LDL CALC: 53 mg/dL (ref <130)
Triglycerides: 91 mg/dL (ref <150)
VLDL: 18 mg/dL (ref <30)

## 2015-07-03 LAB — HEMOGLOBIN A1C
Hgb A1c MFr Bld: 5.9 % — ABNORMAL HIGH (ref <5.7)
Mean Plasma Glucose: 123 mg/dL

## 2015-07-03 LAB — TSH: TSH: 1.9 m[IU]/L

## 2015-07-03 NOTE — Telephone Encounter (Signed)
-----  Message from [name], NP sent at 07/03/2015  8:17 AM EDT ----- Please call and let her mother know that her platelets are still very low and we will refer her to hematology. Please put in referral.  Also, Her hemoglobin A1c is 5.9 which is still in the pre-diabetic range. I recommend that she continue watching her diet and cut back on sugar intake and carbohydrates. I also recommend that she start walking for exercise, this will help with blood sugar.  Her other labs were fine iincluding cholesterol, thyroid and electrolytes.

## 2015-07-03 NOTE — Telephone Encounter (Signed)
Put referral in epic and pt's mom was notified that referral will be sent to hematology and someone will call them

## 2015-07-04 ENCOUNTER — Telehealth: Payer: Self-pay | Admitting: Hematology and Oncology

## 2015-07-04 ENCOUNTER — Encounter: Payer: Self-pay | Admitting: Hematology and Oncology

## 2015-07-04 NOTE — Telephone Encounter (Signed)
Referring office called to schedule appt; verified address and insurance, mailed out new pt packet, scheduled intake

## 2015-07-10 ENCOUNTER — Telehealth: Payer: Self-pay | Admitting: Hematology and Oncology

## 2015-07-10 ENCOUNTER — Ambulatory Visit (HOSPITAL_BASED_OUTPATIENT_CLINIC_OR_DEPARTMENT_OTHER): Payer: BLUE CROSS/BLUE SHIELD | Admitting: Hematology and Oncology

## 2015-07-10 ENCOUNTER — Encounter: Payer: Self-pay | Admitting: Hematology and Oncology

## 2015-07-10 VITALS — BP 121/55 | HR 79 | Temp 99.3°F | Resp 18 | Ht 64.5 in | Wt 142.3 lb

## 2015-07-10 DIAGNOSIS — D6942 Congenital and hereditary thrombocytopenia purpura: Secondary | ICD-10-CM | POA: Diagnosis not present

## 2015-07-10 DIAGNOSIS — D696 Thrombocytopenia, unspecified: Secondary | ICD-10-CM

## 2015-07-10 NOTE — Progress Notes (Signed)
[facility] CONSULT NOTE  Patient Care Team: [name], NP as PCP - General (Family Medicine)  CHIEF COMPLAINTS/PURPOSE OF CONSULTATION:  Congenital thrombocytopenia  HISTORY OF PRESENTING ILLNESS:  Tammie Wade 20 y.o. female is here because of moving to [place] in with a history of congenital thrombocytopenia. She has had long standing medical problems including chronic heart disease, lung disease, hearing loss, all apparently related to meconium aspiration during delivery of the baby. She moved to [facility] along with her mother. He wanted to set up hematology follow-ups here. They tell me that she was born with low platelets and had undergone bone marrow biopsies which have shown no clear evidence of specific cause of thrombocytopenia. When she was younger she had lots of bleeding problems which would later controlled with oral contraceptive pills. She doesn't have any current concerns with bruising or bleeding.  I reviewed her records extensively and collaborated the history with the patient.   MEDICAL HISTORY:  Past Medical History  Diagnosis Date  . Emphysema of lung ([place])   . COPD (chronic obstructive pulmonary disease) ([place])   . Thrombocytopenia ([place])   . Bronchopulmonary dysplasia   . Congenital thrombocytopenia ([place])   . History of seizures   . Chronic lung disease     SURGICAL HISTORY: Past Surgical History  Procedure Laterality Date  . Cardiac surgery    . Vsd repair    . Eye surgery      SOCIAL HISTORY: Social History   Social History  . Marital Status: Single    Spouse Name: N/A  . Number of Children: N/A  . Years of Education: N/A   Occupational History  . Not on file.   Social History Main Topics  . Smoking status: Never Smoker   . Smokeless tobacco: Not on file  . Alcohol Use: No  . Drug Use: No  . Sexual Activity: Not on file   Other Topics Concern  . Not on file   Social History Narrative    FAMILY  HISTORY: Family History  Problem Relation Age of Onset  . Osteopenia Mother   . Hypertension Mother     ALLERGIES:  has No Known Allergies.  MEDICATIONS:  Current Outpatient Prescriptions  Medication Sig Dispense Refill  . albuterol (PROVENTIL HFA;VENTOLIN HFA) 108 (90 Base) MCG/ACT inhaler Inhale 2 puffs into the lungs every 6 (six) hours as needed for wheezing or shortness of breath. 1 Inhaler 2  . cetirizine (ZYRTEC ALLERGY) 10 MG tablet Take 1 tablet (10 mg total) by mouth daily. 30 tablet 1  . LOW-OGESTREL 0.3-30 MG-MCG tablet Take 1 tablet by mouth daily. 1 Package 5  . omeprazole (PRILOSEC) 40 MG capsule Take 40 mg by mouth daily. Reported on 07/02/2015  1  . SPIRIVA RESPIMAT 2.5 MCG/ACT AERS Inhale 2 puffs into the lungs daily.  1   No current facility-administered medications for this visit.    REVIEW OF SYSTEMS:   Constitutional: Denies fevers, chills or abnormal night sweats Eyes: Denies blurriness of vision, double vision or watery eyes Ears, nose, mouth, throat, and face: Denies mucositis or sore throat Respiratory: Denies cough, dyspnea or wheezes Cardiovascular: Denies palpitation, chest discomfort or lower extremity swelling Gastrointestinal:  Denies nausea, heartburn or change in bowel habits Skin: Denies abnormal skin rashes Lymphatics: Denies new lymphadenopathy or easy bruising Neurological:Denies numbness, tingling or new weaknesses Behavioral/Psych: Mood is stable, no new changes   All other systems were reviewed with the patient and are negative.  PHYSICAL EXAMINATION: ECOG PERFORMANCE STATUS: 1 - Symptomatic but completely ambulatory  Filed Vitals:   07/10/15 1256  BP: 121/55  Pulse: 79  Temp: 99.3 F (37.4 C)  Resp: 18   Filed Weights   07/10/15 1256  Weight: 142 lb 4.8 oz (64.547 kg)    GENERAL:alert, no distress and comfortable SKIN: skin color, texture, turgor are normal, no rashes or significant lesions EYES: normal, conjunctiva are  pink and non-injected, sclera clear OROPHARYNX:no exudate, no erythema and lips, buccal mucosa, and tongue normal  NECK: supple, thyroid normal size, non-tender, without nodularity LYMPH:  no palpable lymphadenopathy in the cervical, axillary or inguinal LUNGS: clear to auscultation and percussion with normal breathing effort HEART: regular rate & rhythm and no murmurs and no lower extremity edema ABDOMEN:abdomen soft, non-tender and normal bowel sounds Musculoskeletal:no cyanosis of digits and no clubbing  PSYCH: alert & oriented x 3 with fluent speech NEURO: no focal motor/sensory deficits   LABORATORY DATA:  I have reviewed the data as listed Lab Results  Component Value Date   WBC 4.2 07/02/2015   HGB 12.6 07/02/2015   HCT 37.5 07/02/2015   MCV 90.8 07/02/2015   PLT 82* 07/02/2015   Lab Results  Component Value Date   NA 134* 07/02/2015   K 4.2 07/02/2015   CL 104 07/02/2015   CO2 24 07/02/2015    RADIOGRAPHIC STUDIES: I have personally reviewed the radiological reports and agreed with the findings in the report.  ASSESSMENT AND PLAN:  Congenital thrombocytopenia ([place]) Congenital thrombocytopenia: Patient had previously been evaluated in [place] and subsequently in [place]. She tells me that her platelet counts ranged from 60-90,000. Her problems with bleeding were severe during menarche but have subsequently improved after being treated with birth control pills. She does not have any current problems with bruising or bleeding.  Most recent platelet count was 82  Plan: An extensive workup by Dr. Melvenia Beam (in [place]) in the past showed no qualitative platelet defect (normal platelet aggregation testing) and no evidence of ITP. Bone marrow testing in the past has shown normal megakaryocytes, and there are no other findings on her CBC today suggest new marrow dysplasias.   The differential diagnosis for congenital thrombocytopenia as is extremely wide which include  amegakaryocytic thrombocytopenia especially because of the fact that she has hearing impairment. Other X-linked thrombocytopenias are also possible as well as MYH-9 -related diseases.  I will see her back in 6 months with recheck of her CBC with differential.   All questions were answered. The patient knows to call the clinic with any problems, questions or concerns.    Rulon Eisenmenger, MD 07/10/2015

## 2015-07-10 NOTE — Progress Notes (Signed)
CBC Component Name  06/19/2011    WBC 5.3  RBC 4.75  Hemoglobin 14.5 (H)  Hematocrit 44.5  MCV 93.7  MCH 30.5  MCHC 32.6 (L)  RDW 13.3  Platelet Count 100 (L)  MDV 12.5 (H)     Visit Diagnoses   Excessive menstruation Excessive or frequent menstruation   Anemia, unspecified Anemia, unspecified   Thrombocytopenia, unspecified Thrombocytopenia, unspecified

## 2015-07-10 NOTE — Telephone Encounter (Signed)
appt made and avs printed °

## 2015-07-10 NOTE — Assessment & Plan Note (Addendum)
Congenital thrombocytopenia: Patient had previously been evaluated in [place] and subsequently in [place]. She tells me that her platelet counts ranged from 60-90,000. Her problems with bleeding were severe during menarche but have subsequently improved after being treated with birth control pills. She does not have any current problems with bruising or bleeding.  Most recent platelet count was 82  Plan: An extensive workup by Dr. Melvenia Beam (in [place]) in the past showed no qualitative platelet defect (normal platelet aggregation testing) and no evidence of ITP. Bone marrow testing in the past has shown normal megakaryocytes, and there are no other findings on her CBC today suggest new marrow dysplasias.   The differential diagnosis for congenital thrombocytopenia as is extremely wide which include amegakaryocytic thrombocytopenia especially because of the fact that she has hearing impairment. Other X-linked thrombocytopenias are also possible as well as MYH-9 -related diseases.  I will see her back in 6 months with recheck of her CBC with differential.

## 2015-07-10 NOTE — Progress Notes (Signed)
Unable to get in to exam room prior to MD.  No assessment performed.

## 2015-07-11 ENCOUNTER — Ambulatory Visit (INDEPENDENT_AMBULATORY_CARE_PROVIDER_SITE_OTHER): Payer: BLUE CROSS/BLUE SHIELD | Admitting: Obstetrics and Gynecology

## 2015-07-11 ENCOUNTER — Encounter: Payer: Self-pay | Admitting: Obstetrics and Gynecology

## 2015-07-11 VITALS — BP 98/56 | HR 80 | Resp 14 | Ht 64.0 in | Wt 142.0 lb

## 2015-07-11 DIAGNOSIS — Z01419 Encounter for gynecological examination (general) (routine) without abnormal findings: Secondary | ICD-10-CM | POA: Diagnosis not present

## 2015-07-11 DIAGNOSIS — Z23 Encounter for immunization: Secondary | ICD-10-CM | POA: Diagnosis not present

## 2015-07-11 DIAGNOSIS — N922 Excessive menstruation at puberty: Secondary | ICD-10-CM

## 2015-07-11 MED ORDER — NORETHIN ACE-ETH ESTRAD-FE 1-20 MG-MCG PO TABS: 1.0000 | ORAL_TABLET | Freq: Every day | ORAL | Status: DC

## 2015-07-11 NOTE — Patient Instructions (Signed)
Call with any questions or concerns

## 2015-07-11 NOTE — Progress Notes (Signed)
Patient ID: [place], female   DOB: [birth year], 20 y.o.   MRN: [id number] 19 y.o. [id number] SingleAfrican AmericanF here for annual exam.  She had very heavy cycles prior to the pill. Cycles are well controlled. Cramps are mild. Never sexually active. She is here with her mother, she has some developmental delays.  Period Cycle (Days): 28 Period Duration (Days): 5 -7 days  Period Pattern: Regular Menstrual Flow: Moderate Menstrual Control: Maxi pad Dysmenorrhea: (!) Mild Dysmenorrhea Symptoms: Cramping  Patient's last menstrual period was 06/23/2015.          Sexually active: No.  The current method of family planning is OCP (estrogen/progesterone).    Exercising: No.   Smoker:  no  Health Maintenance: Pap:  Never History of abnormal Pap:  N/A TDaP:  Up to date  Gardasil: no   reports that she has never smoked. She has never used smokeless tobacco. She reports that she does not drink alcohol or use illicit drugs. She is trying to get into an adult program to try and help her get work. So far not socially ready, issues with listening and following direction. Sister is 34, brother is 60  Past Medical History  Diagnosis Date  . Emphysema of lung ([place])   . COPD (chronic obstructive pulmonary disease) ([place])   . Thrombocytopenia ([place])   . Bronchopulmonary dysplasia   . Congenital thrombocytopenia ([place])   . History of seizures   . Chronic lung disease   . Abnormal uterine bleeding   . Blood transfusion without reported diagnosis   . Prediabetes   No seizures since she was younger than 8  Past Surgical History  Procedure Laterality Date  . Cardiac surgery    . Vsd repair      and PDA repair  . Eye surgery      Current Outpatient Prescriptions  Medication Sig Dispense Refill  . albuterol (PROVENTIL HFA;VENTOLIN HFA) 108 (90 Base) MCG/ACT inhaler Inhale 2 puffs into the lungs every 6 (six) hours as needed for wheezing or shortness of breath. 1 Inhaler 2  . cetirizine (ZYRTEC  ALLERGY) 10 MG tablet Take 1 tablet (10 mg total) by mouth daily. 30 tablet 1  . omeprazole (PRILOSEC) 40 MG capsule Take 40 mg by mouth daily. Reported on 07/02/2015  1  . SPIRIVA RESPIMAT 2.5 MCG/ACT AERS Inhale 2 puffs into the lungs daily.  1  . norethindrone-ethinyl estradiol (JUNEL FE,GILDESS FE,LOESTRIN FE) 1-20 MG-MCG tablet Take 1 tablet by mouth daily. 3 Package 3   No current facility-administered medications for this visit.    Family History  Problem Relation Age of Onset  . Osteopenia Mother   . Hypertension Mother   . Diabetes Mother   . Breast cancer Maternal Aunt   . Cancer Maternal Aunt   . Hyperlipidemia Maternal Aunt   . Diabetes Maternal Aunt   . Lung cancer Maternal Grandmother   . Hyperlipidemia Maternal Grandmother   . Diabetes Maternal Grandmother   . Hyperlipidemia Maternal Grandfather   . Diabetes Maternal Grandfather   Maternal Aunt breast cancer at 2.   Review of Systems  Constitutional: Negative.   HENT: Negative.   Eyes: Negative.   Respiratory: Negative.   Cardiovascular: Negative.   Gastrointestinal: Negative.   Endocrine: Negative.   Genitourinary: Negative.   Musculoskeletal: Negative.   Skin: Negative.   Allergic/Immunologic: Negative.   Neurological: Negative.   Psychiatric/Behavioral: Negative.     Exam:   BP 98/56 mmHg  Pulse 80  Resp 14  Ht 5' 4" (1.626 m)  Wt 142 lb (64.411 kg)  BMI 24.36 kg/m2  LMP 06/23/2015  Weight change: _0 @ Height:   Height: 5' 4" (162.6 cm)  Ht Readings from Last 3 Encounters:  07/11/15 5' 4" (1.626 m)  07/10/15 5' 4.5" (1.638 m)  07/02/15 5' 4.5" (1.638 m)    General appearance: alert, cooperative and appears stated age Head: Normocephalic, without obvious abnormality, atraumatic Neck: no adenopathy, supple, symmetrical, trachea midline and thyroid normal to inspection and palpation Lungs: clear to auscultation bilaterally Breasts: normal appearance, no masses or tenderness Heart:  regular rate and rhythm Abdomen: soft, non-tender; bowel sounds normal; no masses,  no organomegaly Extremities: extremities normal, atraumatic, no cyanosis or edema Skin: Skin color, texture, turgor normal. No rashes or lesions Lymph nodes: Cervical, supraclavicular, and axillary nodes normal. No abnormal inguinal nodes palpated Neurologic: Grossly normal   Pelvic: External genitalia:  no lesions              Urethra:  normal appearing urethra with no masses, tenderness or lesions              Bartholins and Skenes: normal                 Vagina: virginal introitus, patient unable to tolerate               Cervix: unable to insert the speculum more than a cm. Cervix not seen               Bimanual Exam:  Uterus:  only able to insert one vaginal finger about 2 cm. The patient was very tense. No masses, but limited exam.               Adnexa: no mass, fullness, tenderness and limited exam               Rectovaginal:deferred  Chaperone was present for exam.  A:  Well Woman with normal exam  H/O menorrhagia, controlled with OCP's  Multiple medical issues, followed by oncology, pulmonology and her primary  P:   Labs with her primary MD  Unable to obtain a pap smear, patient too uncomfortable to complete the pelvic exam  No STD testing needed  Start gardasil series  F/U in 1 year  Continue OCP's, will change to a lower dose pill   Discussed the importance of being safe if she is sexually active

## 2015-07-12 ENCOUNTER — Encounter: Payer: Self-pay | Admitting: Pulmonary Disease

## 2015-07-12 ENCOUNTER — Other Ambulatory Visit: Payer: BLUE CROSS/BLUE SHIELD

## 2015-07-12 ENCOUNTER — Ambulatory Visit (INDEPENDENT_AMBULATORY_CARE_PROVIDER_SITE_OTHER): Payer: BLUE CROSS/BLUE SHIELD | Admitting: Pulmonary Disease

## 2015-07-12 DIAGNOSIS — J439 Emphysema, unspecified: Secondary | ICD-10-CM

## 2015-07-12 DIAGNOSIS — J449 Chronic obstructive pulmonary disease, unspecified: Secondary | ICD-10-CM

## 2015-07-12 DIAGNOSIS — K219 Gastro-esophageal reflux disease without esophagitis: Secondary | ICD-10-CM | POA: Diagnosis not present

## 2015-07-12 NOTE — Progress Notes (Signed)
Subjective:    Patient ID: [place], female    DOB: [birth year], 20 y.o.   MRN: [id number]  HPI She has been followed for bronchopulmonary dysplasia. She has also been diagnosed with COPD and emphysema. Currently compliant with Spiriva. She reports she uses her rescue inhaler on a daily basis for her cough. She doesn't believe she has been on any other inhalers. She does seem to cough more during days of high pollen. She denies any audible wheezing. She denies any increased sinus congestion or wheezing. She reports baseline dyspnea. She is compliant with daily Prilosec. No reflux, dyspepsia or morning brash water taste on the medication. No fever, chills, or sweats. She denies any chest pressure or pain. She does have some chest wall discomfort with coughing. She was born full term. 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 She was intubated after delivery via C-section. She was on ECMO.   Review of Systems No rashes or bruising recently. No dysuria or hematuria. A pertinent 14 point review of systems is negative except as per the history of presenting illness.  No Known Allergies  Current Outpatient Prescriptions on File Prior to Visit  Medication Sig Dispense Refill  . albuterol (PROVENTIL HFA;VENTOLIN HFA) 108 (90 Base) MCG/ACT inhaler Inhale 2 puffs into the lungs every 6 (six) hours as needed for wheezing or shortness of breath. 1 Inhaler 2  . cetirizine (ZYRTEC ALLERGY) 10 MG tablet Take 1 tablet (10 mg total) by mouth daily. 30 tablet 1  . norethindrone-ethinyl estradiol (JUNEL FE,GILDESS FE,LOESTRIN FE) 1-20 MG-MCG tablet Take 1 tablet by mouth daily. 3 Package 3  . omeprazole (PRILOSEC) 40 MG capsule Take 40 mg by mouth daily. Reported on 07/02/2015  1  . SPIRIVA RESPIMAT 2.5 MCG/ACT AERS Inhale 2 puffs into the lungs daily.  1   No current facility-administered medications on file prior to visit.    Past Medical History  Diagnosis Date  . Emphysema of lung ([place])   . COPD  (chronic obstructive pulmonary disease) ([place])   . Thrombocytopenia ([place])   . Bronchopulmonary dysplasia   . Congenital thrombocytopenia ([place])   . History of seizures   . Chronic lung disease   . Abnormal uterine bleeding   . Blood transfusion without reported diagnosis   . Prediabetes   . Meconium aspiration     Past Surgical History  Procedure Laterality Date  . Cardiac surgery    . Vsd repair      and PDA repair  . Eye surgery      Family History  Problem Relation Age of Onset  . Osteopenia Mother   . Hypertension Mother   . Diabetes Mother   . Breast cancer Maternal Aunt   . Cancer Maternal Aunt   . Hyperlipidemia Maternal Aunt   . Diabetes Maternal Aunt   . Lung cancer Maternal Grandmother   . Hyperlipidemia Maternal Grandmother   . Diabetes Maternal Grandmother   . COPD Maternal Grandmother   . Emphysema Maternal Grandmother   . Sickle cell anemia Maternal Grandmother   . Hyperlipidemia Maternal Grandfather   . Diabetes Maternal Grandfather   . Hypertension Father   . Hyperlipidemia Father   . Sickle cell anemia Maternal Uncle   . Rheum arthritis Cousin     Social History   Social History  . Marital Status: Single    Spouse Name: N/A  . Number of Children: N/A  . Years of Education: N/A   Social History Main Topics  .  Smoking status: Never Smoker   . Smokeless tobacco: Never Used  . Alcohol Use: No  . Drug Use: No  . Sexual Activity: No   Other Topics Concern  . None   Social History Narrative   Originally from [place]. Moved to [place] from [facility] in 2016. Has also lived in [place]. No pets currently. No bird or hot tub exposure. Remotely had problems with mold in an apartment from [place] until 2007 in a bathroom. Does have carpet. Enjoys playing on her phone.       Objective:   Physical Exam BP 124/66 mmHg  Pulse 71  Ht 5' 4" (1.626 m)  Wt 142 lb (64.411 kg)  BMI 24.36 kg/m2  SpO2 100%  LMP 06/23/2015 General:  Awake. Alert. No acute distress. Mother with  her today. Integument:  Warm & dry. No rash on exposed skin. No bruising. Lymphatics:  No appreciated cervical or supraclavicular lymphadenoapthy. HEENT:  Moist mucus membranes. No oral ulcers. No scleral injection or icterus.  Cardiovascular:  Regular rate. No edema. No appreciable JVD.  Pulmonary:  Good aeration & clear to auscultation bilaterally. Symmetric chest wall expansion. No accessory muscle use. Abdomen: Soft. Normal bowel sounds. Nondistended. Grossly nontender. Musculoskeletal:  Normal bulk and tone. Hand grip strength 5/5 bilaterally. No joint deformity or effusion appreciated. Neurological:  CN 2-12 grossly in tact. No meningismus. Moving all 4 extremities equally. Symmetric brachioradialis deep tendon reflexes. Psychiatric:  Mood and affect congruent. Speech normal rhythm, rate & tone.   IMAGING CXR PA/LAT 02/14/15 (personally reviewed by me): Hyperinflation with flattening of the diaphragms and deep sulci bilaterally. Elevation of left hemidiaphragm. Nodule noted within left upper lobe that may be partially calcified. No other parenchymal opacity appreciated. No pleural effusion appreciated. Left hilar fullness suggestive of left atrial enlargement.  LABS 07/02/15 CBC: 4.2/12.6/37.5/82 BMP: 134/4.2/104/24/10/0.76/123/8.6 LFT: 0.2/7.1/1.0/28/19/15    Assessment & Plan:  20 year old African-American female with reported prior history of bronchopulmonary dysplasia after meconium aspiration during delivery and previously diagnosed COPD with emphysema followed by a pulmonologist in [place]. Patient seems to be using her rescue inhaler more frequently recently after her move from [place]. The etiology for her emphysema is unclear at this time. With her maternal grandmother's history of "holes in her lungs" as well as emphysema and no clear prior history of significant tobacco use I do question the possibility of alpha-1 antitrypsin deficiency. I will need to evaluate her  lungs with more detailed imaging and obtain prior records from her pulmonologist. I feel it's reasonable to repeat pulmonary function testing at her follow-up appointment and consider adding an inhaled corticosteroid to her regimen if there is truly an allergic component with higher pollen counts. I instructed the patient and her mother contact my office if she had any new breathing questions before next appointment.   1. Bronchopulmonary dysplasia: Likely secondary to meconium aspiration. 2. COPD with emphysema: Continuing on Spiriva. Obtaining records from previous pulmonologist office. Checking full pulmonary function testing in 6 minute walk test at next appointment. Screening for alpha-1 antitrypsin deficiency. Checking CT scan of the chest without contrast to evaluate extent of emphysema. 3. GERD: Well-controlled on Prilosec. No changes at this time.  4. Follow-up: Patient to return to clinic in 4-6 weeks.   Sonia Baller Ashok Cordia, M.D. [facility] Pager:  [phone] After 3pm or if no response, call [phone] 3:25 PM 07/12/2015

## 2015-07-12 NOTE — Patient Instructions (Signed)
   Continue using your inhalers as prescribed.  You will have a breathing and walking test at your next appointment.  I will see you back in 4-6 weeks but please call if you have any new problems with your breathing before then.   TESTS ORDERED: 1. Full pulmonary function testing at next appointment 2. Six-minute walk test on room air at next appointment 3. CT chest w/o contrast 4. Alpha-1 Antitrypsin Phenotype today

## 2015-07-17 ENCOUNTER — Telehealth: Payer: Self-pay

## 2015-07-17 NOTE — Telephone Encounter (Signed)
Pt's mother called stating she wanted a referral to derm. Said her face broke out bad after using a face wash. Gave her bruises around her eyes and mouth and made her skin scaly.

## 2015-07-17 NOTE — Telephone Encounter (Signed)
Mom will try to call [facility] to get her in for an appt without being seen here.

## 2015-07-17 NOTE — Telephone Encounter (Signed)
They may not need a referral to see dermatologist based on her insurance. Please check on this for her. I am ok referring her for this.

## 2015-07-18 LAB — ALPHA-1 ANTITRYPSIN PHENOTYPE: A1 ANTITRYPSIN: 247 mg/dL — AB (ref 83–199)

## 2015-07-23 ENCOUNTER — Ambulatory Visit (INDEPENDENT_AMBULATORY_CARE_PROVIDER_SITE_OTHER)
Admission: RE | Admit: 2015-07-23 | Discharge: 2015-07-23 | Disposition: A | Discharge status: Home / Self Care | Payer: BLUE CROSS/BLUE SHIELD | Source: Ambulatory Visit | Attending: Pulmonary Disease | Admitting: Pulmonary Disease

## 2015-07-23 ENCOUNTER — Ambulatory Visit (HOSPITAL_COMMUNITY)
Admission: RE | Admit: 2015-07-23 | Discharge: 2015-07-23 | Disposition: A | Discharge status: Home / Self Care | Payer: BLUE CROSS/BLUE SHIELD | Source: Ambulatory Visit | Attending: Pulmonary Disease | Admitting: Pulmonary Disease

## 2015-07-23 DIAGNOSIS — J449 Chronic obstructive pulmonary disease, unspecified: Secondary | ICD-10-CM | POA: Diagnosis present

## 2015-07-23 DIAGNOSIS — J439 Emphysema, unspecified: Secondary | ICD-10-CM

## 2015-07-23 LAB — PULMONARY FUNCTION TEST
DL/VA % pred: 50 %
DL/VA: 2.47 ml/min/mmHg/L
DLCO UNC % PRED: 38 %
DLCO UNC: 9.71 ml/min/mmHg
FEF 25-75 PRE: 0.55 L/s
FEF 25-75 Post: 0.51 L/sec
FEF2575-%CHANGE-POST: -7 %
FEF2575-%PRED-PRE: 15 %
FEF2575-%Pred-Post: 14 %
FEV1-%Change-Post: -3 %
FEV1-%PRED-POST: 45 %
FEV1-%Pred-Pre: 47 %
FEV1-POST: 1.33 L
FEV1-Pre: 1.38 L
FEV1FVC-%CHANGE-POST: -1 %
FEV1FVC-%Pred-Pre: 59 %
FEV6-%CHANGE-POST: -2 %
FEV6-%PRED-POST: 73 %
FEV6-%PRED-PRE: 75 %
FEV6-PRE: 2.52 L
FEV6-Post: 2.46 L
FEV6FVC-%CHANGE-POST: -1 %
FEV6FVC-%PRED-PRE: 94 %
FEV6FVC-%Pred-Post: 93 %
FVC-%Change-Post: -1 %
FVC-%Pred-Post: 78 %
FVC-%Pred-Pre: 79 %
FVC-Post: 2.62 L
FVC-Pre: 2.67 L
POST FEV1/FVC RATIO: 51 %
POST FEV6/FVC RATIO: 94 %
PRE FEV1/FVC RATIO: 52 %
Pre FEV6/FVC Ratio: 95 %
RV % PRED: 224 %
RV: 2.78 L
TLC % pred: 105 %
TLC: 5.42 L

## 2015-07-23 MED ORDER — ALBUTEROL SULFATE (2.5 MG/3ML) 0.083% IN NEBU
2.5000 mg | INHALATION_SOLUTION | Freq: Once | RESPIRATORY_TRACT | Status: AC
  Administered 2015-07-23: 2.5 mg via RESPIRATORY_TRACT

## 2015-07-30 ENCOUNTER — Telehealth: Payer: Self-pay | Admitting: Pulmonary Disease

## 2015-07-30 NOTE — Telephone Encounter (Signed)
Rec'd from [facility] forward 16 pages to Dr.Nestor

## 2015-08-08 ENCOUNTER — Telehealth: Payer: Self-pay | Admitting: Internal Medicine

## 2015-08-08 NOTE — Telephone Encounter (Signed)
Mom called to ask [place] if we should refer patient somewhere for like therapy or something because pt is talking to herself. Mom was advised that she needs an appt to discuss this and to get a referral. mom states she did talk to a friend and told her to take her to [facility] so she is taking her there

## 2015-08-09 ENCOUNTER — Telehealth: Payer: Self-pay | Admitting: Pulmonary Disease

## 2015-08-09 NOTE — Telephone Encounter (Signed)
Overnight Oximetry 06/19/14:  Performed on Room Air. Lowest saturation 92%. 3 total desaturation events. 304 pulse events.   PFT 04/20/14: FVC 2.50 L (77%) FEV1 1.37 L (47%) FEV1/FVC 0.55 FEF 25-75 0.63 L (17%) no bronchodilator response TLC 5.03 L (98%) RV 159% ERV 89% DLCO uncorrected 56%   LABS Alpha-1 Antitrypsin (05/01/14):  PiM (>34 microM)

## 2015-08-13 ENCOUNTER — Encounter: Payer: Self-pay | Admitting: Pulmonary Disease

## 2015-08-13 ENCOUNTER — Encounter (INDEPENDENT_AMBULATORY_CARE_PROVIDER_SITE_OTHER): Payer: Self-pay

## 2015-08-13 ENCOUNTER — Ambulatory Visit (INDEPENDENT_AMBULATORY_CARE_PROVIDER_SITE_OTHER): Payer: BLUE CROSS/BLUE SHIELD | Admitting: Pulmonary Disease

## 2015-08-13 VITALS — BP 114/70 | HR 63 | Ht 65.5 in | Wt 140.2 lb

## 2015-08-13 DIAGNOSIS — J449 Chronic obstructive pulmonary disease, unspecified: Secondary | ICD-10-CM

## 2015-08-13 DIAGNOSIS — J439 Emphysema, unspecified: Secondary | ICD-10-CM

## 2015-08-13 DIAGNOSIS — K219 Gastro-esophageal reflux disease without esophagitis: Secondary | ICD-10-CM | POA: Diagnosis not present

## 2015-08-13 DIAGNOSIS — R0609 Other forms of dyspnea: Secondary | ICD-10-CM | POA: Diagnosis not present

## 2015-08-13 NOTE — Patient Instructions (Addendum)
   Continue using her Spiriva and other medications as prescribed  Please call me if you have any new breathing problems or productive cough before your next appointment  I will see you back in 6 months or sooner if needed  Remember to get a flu shot in September

## 2015-08-13 NOTE — Progress Notes (Signed)
Subjective:    Patient ID: Tammie Wade, female    DOB: [birth year], 20 y.o.   MRN: [id number]  C.C.:  Follow-up for Bronchopulmonary Dysplasia, Severe COPD w/ Bullous Emphysema, & GERD.  HPI  Bronchopulmonary Dysplasia: Likely secondary to meconium aspiration.  Severe COPD w/ Bullous Emphysema: She is compliant with Spiriva. She uses her rescue inhaler rarely. She reports "mild, intermittent" coughing. No wheezing. No exacerbations since last appointment.  GERD:  Compliant with Prilosec. No morning brash water taste. No reflux or dyspepsia.  Review of Systems No chest pain or pressure. No fever, chills, or sweats.   No Known Allergies  Current Outpatient Prescriptions on File Prior to Visit  Medication Sig Dispense Refill  . albuterol (PROVENTIL HFA;VENTOLIN HFA) 108 (90 Base) MCG/ACT inhaler Inhale 2 puffs into the lungs every 6 (six) hours as needed for wheezing or shortness of breath. 1 Inhaler 2  . cetirizine (ZYRTEC ALLERGY) 10 MG tablet Take 1 tablet (10 mg total) by mouth daily. 30 tablet 1  . norethindrone-ethinyl estradiol (JUNEL FE,GILDESS FE,LOESTRIN FE) 1-20 MG-MCG tablet Take 1 tablet by mouth daily. 3 Package 3  . omeprazole (PRILOSEC) 40 MG capsule Take 40 mg by mouth daily. Reported on 07/02/2015  1  . SPIRIVA RESPIMAT 2.5 MCG/ACT AERS Inhale 2 puffs into the lungs daily.  1   No current facility-administered medications on file prior to visit.    Past Medical History  Diagnosis Date  . Emphysema of lung ([place])   . COPD (chronic obstructive pulmonary disease) ([place])   . Thrombocytopenia ([place])   . Bronchopulmonary dysplasia   . 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 History of seizures   . Chronic lung disease   . Abnormal uterine bleeding   . Blood transfusion without reported diagnosis   . Prediabetes   . Meconium aspiration     Past Surgical History  Procedure Laterality Date  . Cardiac surgery    . Vsd repair      and PDA repair  . Eye surgery       Family History  Problem Relation Age of Onset  . Osteopenia Mother   . Hypertension Mother   . Diabetes Mother   . Breast cancer Maternal Aunt   . Cancer Maternal Aunt   . Hyperlipidemia Maternal Aunt   . Diabetes Maternal Aunt   . Lung cancer Maternal Grandmother   . Hyperlipidemia Maternal Grandmother   . Diabetes Maternal Grandmother   . COPD Maternal Grandmother   . Emphysema Maternal Grandmother   . Sickle cell anemia Maternal Grandmother   . Hyperlipidemia Maternal Grandfather   . Diabetes Maternal Grandfather   . Hypertension Father   . Hyperlipidemia Father   . Sickle cell anemia Maternal Uncle   . Rheum arthritis Cousin     Social History   Social History  . Marital Status: Single    Spouse Name: N/A  . Number of Children: N/A  . Years of Education: N/A   Social History Main Topics  . Smoking status: Never Smoker   . Smokeless tobacco: Never Used  . Alcohol Use: No  . Drug Use: No  . Sexual Activity: No   Other Topics Concern  . Not on file   Social History Narrative   Originally from [place]. Moved to [place] from [facility] in 2016. Has also lived in [place]. No pets currently. No bird or hot tub exposure. Remotely had problems with mold in an apartment from [place] until 2007 in a bathroom. Does have  carpet. Enjoys playing on her phone.       Objective:   Physical Exam There were no vitals taken for this visit. General:  Awake. Alert. No acute distress. Mother with her today. Integument:  Warm & dry. No rash on exposed skin. No bruising. Lymphatics:  No appreciated cervical or supraclavicular lymphadenoapthy. HEENT:  Moist mucus membranes. No oral ulcers. No scleral injection or icterus.  Cardiovascular:  Regular rate. No edema. No appreciable JVD.  Pulmonary:  Good aeration & clear to auscultation bilaterally. Symmetric chest wall expansion. No accessory muscle use.. Musculoskeletal:  Normal bulk and tone. Hand grip strength 5/5 bilaterally. No joint deformity or  effusion appreciated.  PFT 07/23/15: FVC 2.67 L (79%) FEV1 1.38 L (47%) FEV1/FVC 0.5 to FEF 25-75 0.55 L (15%) no bronchodilator response TLC 5.42 L (105%) RV 224% ERV 90% DLCO uncorrected 38% (hemoglobin 13.4) 04/20/14: FVC 2.50 L (77%) FEV1 1.37 L (47%) FEV1/FVC 0.55 FEF 25-75 0.63 L (17%) no bronchodilator response TLC 5.03 L (98%) RV 159% ERV 89% DLCO uncorrected 56%   6MWT 08/13/15:  Walked 336 meters / Baseline Sat 99% on RA / Nadir Sat 99% on RA  Overnight Oximetry 06/19/14: Performed on Room Air. Lowest saturation 92%. 3 total desaturation events. 304 pulse events.   IMAGING CT CHEST W/O 07/23/15 (personally reviewed by me): Bullae noted throughout both lungs. Abnormal dysplastic formation/changes in both lungs. Volume loss in both lower lobes. No pleural effusion or thickening appreciated. No nodule or opacity appreciated. No pathologic mediastinal adenopathy.  CXR PA/LAT 02/14/15 (previously reviewed by me): Hyperinflation with flattening of the diaphragms and deep sulci bilaterally. Elevation of left hemidiaphragm. Nodule noted within left upper lobe that may be partially calcified. No other parenchymal opacity appreciated. No pleural effusion appreciated. Left hilar fullness suggestive of left atrial enlargement.  LABS 07/12/15 Alpha-1 antitrypsin: MM (247)  07/02/15 CBC: 4.2/12.6/37.5/82 BMP: 134/4.2/104/24/10/0.76/123/8.6 LFT: 0.2/7.1/1.0/28/19/15  Alpha-1 Antitrypsin (05/01/14): PiM (>34 microM)    Assessment & Plan:   6MWT reviewed.

## 2015-08-13 NOTE — Progress Notes (Signed)
Subjective:    Patient ID: [place], female    DOB: 1995/10/18, 20 y.o.   MRN: [id number]  C.C.:  Follow-up for Bronchopulmonary Dysplasia, Severe COPD w/ Bullous Emphysema, & GERD.  HPI  Bronchopulmonary Dysplasia: Likely secondary to meconium aspiration.7  Severe COPD w/ Bullous Emphysema: She is compliant with Spiriva. She uses her rescue inhaler rarely. She reports "mild, intermittent" coughing. No wheezing. No exacerbations since last appointment.  GERD:  Compliant with Prilosec. No morning brash water taste. No reflux or dyspepsia.  Review of Systems No chest pain or pressure. No fever, chills, or sweats.   No Known Allergies  Current Outpatient Prescriptions on File Prior to Visit  Medication Sig Dispense Refill  . albuterol (PROVENTIL HFA;VENTOLIN HFA) 108 (90 Base) MCG/ACT inhaler Inhale 2 puffs into the lungs every 6 (six) hours as needed for wheezing or shortness of breath. 1 Inhaler 2  . cetirizine (ZYRTEC ALLERGY) 10 MG tablet Take 1 tablet (10 mg total) by mouth daily. 30 tablet 1  . norethindrone-ethinyl estradiol (JUNEL FE,GILDESS FE,LOESTRIN FE) 1-20 MG-MCG tablet Take 1 tablet by mouth daily. 3 Package 3  . omeprazole (PRILOSEC) 40 MG capsule Take 40 mg by mouth daily. Reported on 07/02/2015  1  . SPIRIVA RESPIMAT 2.5 MCG/ACT AERS Inhale 2 puffs into the lungs daily.  1   No current facility-administered medications on file prior to visit.    Past Medical History  Diagnosis Date  . Emphysema of lung ([place])   . COPD (chronic obstructive pulmonary disease) ([place])   . Thrombocytopenia ([place])   . Bronchopulmonary dysplasia   . 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 History of seizures   . Chronic lung disease   . Abnormal uterine bleeding   . Blood transfusion without reported diagnosis   . Prediabetes   . Meconium aspiration     Past Surgical History  Procedure Laterality Date  . Cardiac surgery    . Vsd repair      and PDA repair  . Eye surgery       Family History  Problem Relation Age of Onset  . Osteopenia Mother   . Hypertension Mother   . Diabetes Mother   . Breast cancer Maternal Aunt   . Cancer Maternal Aunt   . Hyperlipidemia Maternal Aunt   . Diabetes Maternal Aunt   . Lung cancer Maternal Grandmother   . Hyperlipidemia Maternal Grandmother   . Diabetes Maternal Grandmother   . COPD Maternal Grandmother   . Emphysema Maternal Grandmother   . Sickle cell anemia Maternal Grandmother   . Hyperlipidemia Maternal Grandfather   . Diabetes Maternal Grandfather   . Hypertension Father   . Hyperlipidemia Father   . Sickle cell anemia Maternal Uncle   . Rheum arthritis Cousin     Social History   Social History  . Marital Status: Single    Spouse Name: N/A  . Number of Children: N/A  . Years of Education: N/A   Social History Main Topics  . Smoking status: Never Smoker   . Smokeless tobacco: Never Used  . Alcohol Use: No  . Drug Use: No  . Sexual Activity: No   Other Topics Concern  . None   Social History Narrative   Originally from [place]. Moved to [place] from [facility] in 2016. Has also lived in [place]. No pets currently. No bird or hot tub exposure. Remotely had problems with mold in an apartment from [place] until 2007 in a bathroom. Does have carpet. Enjoys  playing on her phone.       Objective:   Physical Exam BP 114/70 mmHg  Pulse 63  Ht 5' 5.5" (1.664 m)  Wt 140 lb 3.2 oz (63.594 kg)  BMI 22.97 kg/m2  SpO2 99% General:  Awake. Alert. No distress.  Integument:  Warm & dry. No rash on exposed skin.  Lymphatics:  No appreciated cervical or supraclavicular lymphadenoapthy. HEENT:  Moist mucus membranes. No oral ulcers. No scleral injection.  Cardiovascular:  Regular rate. No edema. Normal S1 & S2.  Pulmonary:  Clear bilaterally to auscultation. Normal work of breathing on room air. Musculoskeletal:  Normal bulk and tone. No joint deformity or effusion appreciated.  PFT 07/23/15: FVC 2.67 L (79%) FEV1 1.38 L (47%)  FEV1/FVC 0.5 to FEF 25-75 0.55 L (15%) no bronchodilator response TLC 5.42 L (105%) RV 224% ERV 90% DLCO uncorrected 38% (hemoglobin 13.4) 04/20/14: FVC 2.50 L (77%) FEV1 1.37 L (47%) FEV1/FVC 0.55 FEF 25-75 0.63 L (17%) no bronchodilator response TLC 5.03 L (98%) RV 159% ERV 89% DLCO uncorrected 56%   6MWT 08/13/15:  Walked 336 meters / Baseline Sat 99% on RA / Nadir Sat 99% on RA  Overnight Oximetry 06/19/14: Performed on Room Air. Lowest saturation 92%. 3 total desaturation events. 304 pulse events.   IMAGING CT CHEST W/O 07/23/15 (personally reviewed by me): Bullae noted throughout both lungs. Abnormal dysplastic formation/changes in both lungs. Volume loss in both lower lobes. No pleural effusion or thickening appreciated. No nodule or opacity appreciated. No pathologic mediastinal adenopathy.  CXR PA/LAT 02/14/15 (previously reviewed by me): Hyperinflation with flattening of the diaphragms and deep sulci bilaterally. Elevation of left hemidiaphragm. Nodule noted within left upper lobe that may be partially calcified. No other parenchymal opacity appreciated. No pleural effusion appreciated. Left hilar fullness suggestive of left atrial enlargement.  LABS 07/12/15 Alpha-1 antitrypsin: MM (247)  07/02/15 CBC: 4.2/12.6/37.5/82 BMP: 134/4.2/104/24/10/0.76/123/8.6 LFT: 0.2/7.1/1.0/28/19/15  Alpha-1 Antitrypsin (05/01/14): PiM (>34 microM)    Assessment & Plan:  20 year old African-American female with history of bronchopulmonary dysplasia. Spirometry performed shows severe airways obstruction and the patient's reduction in, monoxide diffusion capacity correlates with her bullous emphysema as well as air trapping on lung volumes. Symptomatically the patient is very well-controlled at this time. Reflexes well-controlled as well. I instructed the patient to contact my office if he had any new breathing problems or questions before next appointment.  1. Bronchopulmonary dysplasia: Likely  secondary to meconium aspiration. Recommended Influenza Vaccine in September 2017. 2. Severe COPD w/ Bullous Emphysema: Continuing Spiriva without change.  3. GERD: Controlled with Prilosec. No changes. 4. Follow-up: Patient to return to clinic in 6 months or sooner if needed.   Sonia Baller Ashok Cordia, M.D. [facility] Pager:  [phone] After 3pm or if no response, call [phone] 9:50 AM 08/13/2015

## 2015-08-21 ENCOUNTER — Telehealth: Payer: Self-pay | Admitting: Obstetrics and Gynecology

## 2015-08-21 ENCOUNTER — Other Ambulatory Visit: Payer: Self-pay | Admitting: Family Medicine

## 2015-08-21 NOTE — Telephone Encounter (Signed)
Spoke with patient's mother, [name]. DPR is on file to speak with mother dated 07/23/2015. Patient has mental disability. Mother states that the patient was switched to Junel Fe OCP at her last OV with Dr.Jertson in April. Reports patient started her first pill on the first day of her menses. Since she has started [employer] she has been having persistent bleeding that will stop for 2 days and restart. Reports bleeding is the flow of a "normal cycle" typically, but has now gotten heavier. Patient is having to wear a larger pad and is feeling fatigued. Since started Junel Fe her appetite has also decreased. Patient is having increased cramping. Mother reports the patient was not having any problems with her old pills. Advised she will need to be seen in the office for further evaluation with Dr.Jertson. Mother is agreeable. Appointment scheduled for tomorrow 08/22/2015 at 9 am with Dr.Jertson. Agreeable to date and time.  Routing to provider for final review. Patient agreeable to disposition. Will close encounter.

## 2015-08-21 NOTE — Telephone Encounter (Signed)
Patient's mom [name] calling (no dpr on file) because patient has been bleeding for a month.

## 2015-08-21 NOTE — Telephone Encounter (Signed)
Has not been filled by [facility] prior so sending to you for review

## 2015-08-22 ENCOUNTER — Encounter: Payer: Self-pay | Admitting: Obstetrics and Gynecology

## 2015-08-22 ENCOUNTER — Ambulatory Visit (INDEPENDENT_AMBULATORY_CARE_PROVIDER_SITE_OTHER): Payer: BLUE CROSS/BLUE SHIELD | Admitting: Obstetrics and Gynecology

## 2015-08-22 ENCOUNTER — Telehealth: Payer: Self-pay

## 2015-08-22 VITALS — BP 102/60 | HR 72 | Resp 16 | Wt 138.0 lb

## 2015-08-22 DIAGNOSIS — N946 Dysmenorrhea, unspecified: Secondary | ICD-10-CM

## 2015-08-22 DIAGNOSIS — N921 Excessive and frequent menstruation with irregular cycle: Secondary | ICD-10-CM

## 2015-08-22 DIAGNOSIS — N922 Excessive menstruation at puberty: Secondary | ICD-10-CM

## 2015-08-22 LAB — CBC
HCT: 37.6 % (ref 35.0–45.0)
HEMOGLOBIN: 12.2 g/dL (ref 11.7–15.5)
MCH: 29.3 pg (ref 27.0–33.0)
MCHC: 32.4 g/dL (ref 32.0–36.0)
MCV: 90.2 fL (ref 80.0–100.0)
MPV: 11.4 fL (ref 7.5–12.5)
Platelets: 98 10*3/uL — ABNORMAL LOW (ref 140–400)
RBC: 4.17 MIL/uL (ref 3.80–5.10)
RDW: 13.4 % (ref 11.0–15.0)
WBC: 4.2 10*3/uL (ref 3.8–10.8)

## 2015-08-22 LAB — FERRITIN: FERRITIN: 18 ng/mL (ref 10–154)

## 2015-08-22 MED ORDER — CETIRIZINE HCL 10 MG PO TABS: 10.0000 mg | ORAL_TABLET | Freq: Every day | ORAL | Status: DC

## 2015-08-22 NOTE — Telephone Encounter (Signed)
Fax rcvd for requesting refill of cetrizine 10 mg.

## 2015-08-22 NOTE — Telephone Encounter (Signed)
Would it be okay to refill this as, it was filled back in 02/2015 at ER and not here

## 2015-08-22 NOTE — Progress Notes (Signed)
Patient ID: [place], female   DOB: 1995-07-22, 20 y.o.   MRN: [id number] GYNECOLOGY  VISIT   HPI: 20 y.o.   Single  African American  female   [place] with Patient's last menstrual period was 07/24/2015.   Herec/o irregular menstrual cycle and feeling fatigue. She has been bleeding since starting her new OCP on 07-24-15. She was switched to a lower dose pill at her last visit. She started her pills when she was supposed to start her other pack. She just completed her first pack of pills yesterday. She missed a couple of pills. She doesn't typically forget her pills. She has been bleeding for most of the month. It started out light, it got heavier. Changing a pad up to every hour. Heavy for the last 2 weeks, had cramps (like her normal cramps). On the prior pill she was changing her pad in 3-4 hours. Not light headed, short of breath.   GYNECOLOGIC HISTORY: Patient's last menstrual period was 07/24/2015. Contraception:OCP Menopausal hormone therapy: none        OB History    Gravida Para Term Preterm AB TAB SAB Ectopic Multiple Living   _0         Patient Active Problem List   Diagnosis Date Noted  . 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 COPD, severe ([place]) 07/12/2015  . Emphysema lung ([place]) 07/12/2015  . GERD (gastroesophageal reflux disease) 07/12/2015  . 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 Emphysema of lung ([place])   . COPD (chronic obstructive pulmonary disease) ([place])   . Thrombocytopenia ([place])   . Bronchopulmonary dysplasia   . 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 History of seizures   . Chronic lung disease   . Abnormal uterine bleeding   . Blood transfusion without reported diagnosis   . Prediabetes   . Meconium aspiration     Past Surgical History  Procedure Laterality Date  . Cardiac surgery    . Vsd repair      and PDA repair  . Eye surgery      Current Outpatient Prescriptions   Medication Sig Dispense Refill  . albuterol (PROVENTIL HFA;VENTOLIN HFA) 108 (90 Base) MCG/ACT inhaler Inhale 2 puffs into the lungs every 6 (six) hours as needed for wheezing or shortness of breath. 1 Inhaler 2  . cetirizine (ZYRTEC ALLERGY) 10 MG tablet Take 1 tablet (10 mg total) by mouth daily. 30 tablet 1  . norethindrone-ethinyl estradiol (JUNEL FE,GILDESS FE,LOESTRIN FE) 1-20 MG-MCG tablet Take 1 tablet by mouth daily. 3 Package 3  . omeprazole (PRILOSEC) 40 MG capsule Take 40 mg by mouth daily. Reported on 07/02/2015  1  . SPIRIVA RESPIMAT 2.5 MCG/ACT AERS Inhale 2 puffs into the lungs daily.  1   No current facility-administered medications for this visit.     ALLERGIES: Review of patient's allergies indicates no known allergies.  Family History  Problem Relation Age of Onset  . Osteopenia Mother   . Hypertension Mother   . Diabetes Mother   . Breast cancer Maternal Aunt   . Cancer Maternal Aunt   . Hyperlipidemia Maternal Aunt   . Diabetes Maternal Aunt   . Lung cancer Maternal Grandmother   . Hyperlipidemia Maternal Grandmother   . Diabetes Maternal Grandmother   . COPD Maternal Grandmother   . Emphysema Maternal Grandmother   . Sickle cell anemia Maternal Grandmother   . Hyperlipidemia Maternal Grandfather   .  Diabetes Maternal Grandfather   . Hypertension Father   . Hyperlipidemia Father   . Sickle cell anemia Maternal Uncle   . Rheum arthritis Cousin     Social History   Social History  . Marital Status: Single    Spouse Name: N/A  . Number of Children: N/A  . Years of Education: N/A   Occupational History  . Not on file.   Social History Main Topics  . Smoking status: Never Smoker   . Smokeless tobacco: Never Used  . Alcohol Use: No  . Drug Use: No  . Sexual Activity: No   Other Topics Concern  . Not on file   Social History Narrative   Originally from [place]. Moved to [place] from [facility] in 2016. Has also lived in [place]. No pets currently. No bird or hot tub  exposure. Remotely had problems with mold in an apartment from [place] until 2007 in a bathroom. Does have carpet. Enjoys playing on her phone.     Review of Systems  Constitutional: Positive for malaise/fatigue.  HENT: Negative.   Eyes: Negative.   Respiratory: Negative.   Cardiovascular: Negative.   Gastrointestinal: Negative.   Genitourinary:       Irregular menstrual bleeding Painful periods  Musculoskeletal: Negative.   Skin: Negative.   Neurological: Negative.   Endo/Heme/Allergies: Negative.   Psychiatric/Behavioral: Negative.     PHYSICAL EXAMINATION:    BP 102/60 mmHg  Pulse 72  Resp 16  Wt 138 lb (62.596 kg)  LMP 07/24/2015    General appearance: alert, cooperative and appears stated age Abdomen: soft, non-tender; non-distended; no masses,  no organomegaly   ASSESSMENT Bleeding on her first pack of new low dose pills, she did forget pills The bleeding has been heavy Dysmenorrhea, using naproxen prn    PLAN Mom will make sure she take her pills every day They will give this pack another 1-2 months, if her bleeding persists will change back to the other pill Will check a CBC and Ferritin today given her heavy bleeding   An After Visit Summary was printed and given to the patient.  15 minutes face to face time of which over 50% was spent in counseling.

## 2015-08-23 ENCOUNTER — Telehealth: Payer: Self-pay | Admitting: Internal Medicine

## 2015-08-23 MED ORDER — CETIRIZINE HCL 10 MG PO TABS: 10.0000 mg | ORAL_TABLET | Freq: Every day | ORAL | Status: DC

## 2015-08-23 NOTE — Telephone Encounter (Signed)
Dr. Redmond School printed these yesterday instead of sending them through to pharmacy. I have resent it in

## 2015-09-30 ENCOUNTER — Telehealth: Payer: Self-pay | Admitting: Family Medicine

## 2015-09-30 MED ORDER — SPIRIVA RESPIMAT 2.5 MCG/ACT IN AERS: 2.0000 | INHALATION_SPRAY | Freq: Every day | RESPIRATORY_TRACT | Status: DC

## 2015-09-30 NOTE — Telephone Encounter (Signed)
Sent in med to pharmacy 

## 2015-09-30 NOTE — Telephone Encounter (Signed)
Please refill spiriva for her.

## 2015-09-30 NOTE — Telephone Encounter (Signed)
Pt's mother called for refill of Spiriva. This has not been filled by [facility] before but is on med list. Please send to [facility] on [address].

## 2015-11-18 ENCOUNTER — Telehealth: Payer: Self-pay | Admitting: Obstetrics and Gynecology

## 2015-11-18 NOTE — Telephone Encounter (Signed)
Patient's mom called back, her name is Quarry manager (DPR on file to share PHI). She cancelled the appointment with Dr. Talbert Nan and want to reschedule with Dr. Sabra Heck. Routing to triage for scheduling due to the nature of the problem.

## 2015-11-18 NOTE — Telephone Encounter (Signed)
Patients mother called requesting appointment with Dr Talbert Nan. States patient has bleeding issues and Dr Talbert Nan prescribed medication to decrease bleeding. States it has increased the bleeding instead. States daughter has had a period for a month at current time.  Scheduled with Dr Talbert Nan on Wednesday 11/20/15. Agreeable to appointment date/time and agreeable to call from nurse to review symptoms.

## 2015-11-18 NOTE — Telephone Encounter (Signed)
Spoke with patient's mother, [name], okay per ROI. Mother states that the patient has been experiencing irregular prolonged bleeding since she was seen in the office in May. Reports she is taking Loestrin Fe daily and has not missed any pills since she was seen in May. Mother reports the patient's bleeding is consistent and is occassionally heavy.  Denies heavy bleeding at this time. Patient reports cramping that she takes naproxen for which relieves discomfort. Patient's fatigue has increased. Mother is asking if the patient can come in to see Dr.Miller. Mother is a patient of Dr.Miller's and feels the patient will feel comfortable having an examination if she sees the same physician. Appointment scheduled for tomorrow 11/19/2015 at 11:15 am with Dr.Miller. She is agreeable to date and time.   Routing to provider for final review. Patient agreeable to disposition. Will close encounter.

## 2015-11-19 ENCOUNTER — Ambulatory Visit (INDEPENDENT_AMBULATORY_CARE_PROVIDER_SITE_OTHER): Payer: BLUE CROSS/BLUE SHIELD | Admitting: Obstetrics & Gynecology

## 2015-11-19 ENCOUNTER — Encounter: Payer: Self-pay | Admitting: Obstetrics & Gynecology

## 2015-11-19 VITALS — BP 98/64 | HR 74 | Resp 16 | Ht 64.5 in | Wt 134.2 lb

## 2015-11-19 DIAGNOSIS — Z124 Encounter for screening for malignant neoplasm of cervix: Secondary | ICD-10-CM | POA: Diagnosis not present

## 2015-11-19 DIAGNOSIS — D6942 Congenital and hereditary thrombocytopenia purpura: Secondary | ICD-10-CM | POA: Diagnosis not present

## 2015-11-19 DIAGNOSIS — N921 Excessive and frequent menstruation with irregular cycle: Secondary | ICD-10-CM

## 2015-11-19 DIAGNOSIS — R790 Abnormal level of blood mineral: Secondary | ICD-10-CM

## 2015-11-19 MED ORDER — LOW-OGESTREL 0.3-30 MG-MCG PO TABS: 1.0000 | ORAL_TABLET | Freq: Every day | ORAL | 3 refills | Status: DC

## 2015-11-19 NOTE — Progress Notes (Signed)
GYNECOLOGY  VISIT   HPI: 20 y.o. G0P0000 Single African American female with DUB on current OCPs here for discussion of additional evaluation/change in pills.  Pt with long hx of menorrhagia and thrombocytopenia.  Has extensive hematology work up.  Mother accompanied pt today and is a poor historian.  Review of Dr. Geralyn Flash notes show records from hematologist in [place] did not show clear cause of thrombocytopenia.  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  Pt previously on Low-ogestrel with good cycle control.  OCPs changed to Loestrin to try and lower hormonal dosage.  Pt was initially missing a lot of pills and having significant bleeding. However, her mother has helped her take her pills with much improved regularity and she is not have the significant bleeding that she was having.  She is, however, still having a lot of BTB.  For example, last week, she had dark spotting for six days and she was right in the middle of her pack of pills.  Pt states she'd like to try and have a pelvic exam today.  Her mother would like pt to have pap if possible.  GYNECOLOGIC HISTORY: LMP was 11/02/15 and then she spotted on 11/15/15 for six days.  Patient Active Problem List   Diagnosis Date Noted  . 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 Emphysema lung ([place]) 07/12/2015  . GERD (gastroesophageal reflux disease) 07/12/2015  . 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 Abnormal uterine bleeding   . Blood transfusion without reported diagnosis   . Bronchopulmonary dysplasia   . Chronic lung disease   . 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 COPD (chronic obstructive pulmonary disease) ([place])   . Emphysema of lung ([place])   . History of seizures   . Meconium aspiration   . Prediabetes   . Thrombocytopenia ([place])     Past Surgical History:  Procedure Laterality Date  . CARDIAC SURGERY    . EYE SURGERY     . VSD REPAIR     and PDA repair    MEDS:  Reviewed in EPIC and UTD  ALLERGIES: Review of patient's allergies indicates no known allergies.  Family History  Problem Relation Age of Onset  . Osteopenia Mother   . Hypertension Mother   . Diabetes Mother   . Breast cancer Maternal Aunt   . Cancer Maternal Aunt   . Hyperlipidemia Maternal Aunt   . Diabetes Maternal Aunt   . Lung cancer Maternal Grandmother   . Hyperlipidemia Maternal Grandmother   . Diabetes Maternal Grandmother   . COPD Maternal Grandmother   . Emphysema Maternal Grandmother   . Sickle cell anemia Maternal Grandmother   . Hyperlipidemia Maternal Grandfather   . Diabetes Maternal Grandfather   . Hypertension Father   . Hyperlipidemia Father   . Sickle cell anemia Maternal Uncle   . Rheum arthritis Cousin     SH:  Single, non smoker  Review of Systems  All other systems reviewed and are negative.   PHYSICAL EXAMINATION:    BP 98/64 (BP Location: Right Arm, Patient Position: Sitting)   Pulse 74   Resp 16   Ht 5' 4.5" (1.638 m)   Wt 134 lb 3.2 oz (60.9 kg)   LMP 11/15/2015 (Exact Date)   BMI 22.68 kg/m     General appearance: alert, cooperative and appears stated age Abdomen: soft, non-tender; bowel sounds normal; no masses,  no organomegaly  Pelvic: External  genitalia:  no lesions              Urethra:  normal appearing urethra with no masses, tenderness or lesions              Bartholins and Skenes: normal                 Vagina: normal appearing vagina with normal color and discharge, no lesions              Cervix: no lesions              Bimanual Exam:  Uterus:  normal size, contour, position, consistency, mobility, non-tender              Adnexa: no mass, fullness, tenderness              Rectovaginal: No  Chaperone was present for exam.  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  I cannot find this has been done in her her chart or previous records. Pap pending. Will change pt back to Low-Ogestrel and she will take this with placebo week.  D/W pt and her mother doing continuous active pills but pt desires to have monthly cycle (as I was very manageable in the past with the Low-Ogestrel.)  ~25 minutes spent with patient >50% of time was in face to face discussion of above.  Visit required additional time due to mother being poor historian and need for discussion of options at level where pt has clear understanding

## 2015-11-20 ENCOUNTER — Ambulatory Visit: Payer: BLUE CROSS/BLUE SHIELD | Admitting: Obstetrics and Gynecology

## 2015-11-20 DIAGNOSIS — N921 Excessive and frequent menstruation with irregular cycle: Secondary | ICD-10-CM | POA: Insufficient documentation

## 2015-11-21 LAB — VON WILLEBRAND PANEL
Coagulation Factor VIII: 164 % (ref 50–180)
Ristocetin Co-factor, Plasma: 95 % (ref 42–200)
VON WILLEBRAND ANTIGEN, PLASMA: 185 % (ref 50–217)

## 2015-11-22 LAB — IPS PAP TEST WITH REFLEX TO HPV

## 2015-12-06 ENCOUNTER — Ambulatory Visit (INDEPENDENT_AMBULATORY_CARE_PROVIDER_SITE_OTHER): Payer: BLUE CROSS/BLUE SHIELD | Admitting: Family Medicine

## 2015-12-06 ENCOUNTER — Encounter: Payer: Self-pay | Admitting: Family Medicine

## 2015-12-06 VITALS — BP 110/70 | Temp 98.2°F | Wt 134.6 lb

## 2015-12-06 DIAGNOSIS — R202 Paresthesia of skin: Secondary | ICD-10-CM | POA: Diagnosis not present

## 2015-12-06 NOTE — Patient Instructions (Signed)
Carpal Tunnel Syndrome Carpal tunnel syndrome is a condition that causes pain in your hand and arm. The carpal tunnel is a narrow area that is on the palm side of your wrist. Repeated wrist motion or certain diseases may cause swelling in the tunnel. This swelling can pinch the main nerve in the wrist (median nerve).  HOME CARE If You Have a Splint:  Wear it as told by your doctor. Remove it only as told by your doctor.  Loosen the splint if your fingers:  Become numb and tingle.  Turn blue and cold.  Keep the splint clean and dry. General Instructions  Take over-the-counter and prescription medicines only as told by your doctor.  Rest your wrist from any activity that may be causing your pain. If needed, talk to your employer about changes that can be made in your work, such as getting a wrist pad to use while typing.  If directed, apply ice to the painful area:  Put ice in a plastic bag.  Place a towel between your skin and the bag.  Leave the ice on for 20 minutes, 2-3 times per day.  Keep all follow-up visits as told by your doctor. This is important.  Do any exercises as told by your doctor, physical therapist, or occupational therapist. GET HELP IF:  You have new symptoms.  Medicine does not help your pain.  Your symptoms get worse.   This information is not intended to replace advice given to you by your health care provider. Make sure you discuss any questions you have with your health care provider.   Document Released: 03/12/2011 Document Revised: 12/12/2014 Document Reviewed: 08/08/2014 Elsevier Interactive Patient Education [employer].

## 2015-12-06 NOTE — Progress Notes (Signed)
   Subjective:    Patient ID: [place], female    DOB: Nov 25, 1995, 20 y.o.   MRN: [id number]  HPI Chief Complaint  Patient presents with  . Other    hand numbness for the past 2 months- its been in both hands. not a constant everyday thing   She is here with complaints of intermittent bilateral hand pain, numbness and tingling for the 2 months or so. States it occurs 1-2 times per day. Has noticed this when she is reading specifically. Symptoms last for a couple of minutes. Has not tried any treatment at home. Mother requests a referral to neurologist.   Denies neck pain, shoulder pain, elbow pain, wrist pain. No weakness to upper extremities.  No other areas with numbness, tingling or pain.  Denies fever, chills, fatigue, joint swelling or arthralgias, GI symptoms.   Reviewed allergies, medications, past medical,  and social history.    Review of Systems Pertinent positives and negatives in the history of present illness.     Objective:   Physical Exam  Constitutional: She is oriented to person, place, and time. She appears well-developed and well-nourished. No distress.  Neck: Normal range of motion. Neck supple.  Musculoskeletal:  Ext: bilateral upper extremities are periphereral vascularly intact. Normal ROM and strength. No hypothenar atrophy.  tinels negative. phalens negative.  Raised arm test positive.   Neurological: She is alert and oriented to person, place, and time. She has normal strength. No cranial nerve deficit or sensory deficit. Gait normal.  Skin: Skin is warm and dry. No rash noted. No cyanosis. No pallor.   BP 110/70   Temp 98.2 F (36.8 C) (Oral)   Wt 134 lb 9.6 oz (61.1 kg)   LMP 11/15/2015 (Exact Date)   BMI 22.75 kg/m       Assessment & Plan:  Paresthesia of both hands  Discussed that her symptoms may be related to carpal tunnel. Recommend conservative treatment including a 2 week course of tylenol since NSAIDS are contraindicated per  hematologist. She may also try wrist splint and focus on keeping her wrists in a more neutral position to see if this helps. She will keep a record of the occurrences going forward and any associated symptoms. If not improving will consider nerve conduction study or possible neurologist referral.

## 2015-12-17 ENCOUNTER — Ambulatory Visit: Payer: BLUE CROSS/BLUE SHIELD | Admitting: Family Medicine

## 2015-12-19 ENCOUNTER — Telehealth: Payer: Self-pay | Admitting: Pulmonary Disease

## 2015-12-19 MED ORDER — TIOTROPIUM BROMIDE MONOHYDRATE 18 MCG IN CAPS: 18.0000 ug | ORAL_CAPSULE | Freq: Every day | RESPIRATORY_TRACT | 6 refills | Status: DC

## 2015-12-19 NOTE — Telephone Encounter (Signed)
Called spoke with pt mother. Aware of change and also the pharmacists will need to show them how to use the inhaler. nothing further needed

## 2015-12-19 NOTE — Telephone Encounter (Signed)
Spoke with pt's mother, [name]. States that Spiriva Respimat is not covered by [facility] Medicaid. I looked at the formulary for Medicaid. Spiriva Handihaler is preferred. Medicaid will not cover a non preferred medication until a preferred medication is tried.  JN - please advise. Thanks.

## 2015-12-19 NOTE — Telephone Encounter (Signed)
Ok. Go ahead and switch her over to HandiHaler please. Thanks.

## 2015-12-21 ENCOUNTER — Telehealth: Payer: Self-pay | Admitting: Family Medicine

## 2015-12-21 NOTE — Telephone Encounter (Signed)
P.A. SPIRIVA RESPIMAT, per notes from pulmonologist  was switched to [facility]

## 2016-01-05 ENCOUNTER — Encounter (HOSPITAL_COMMUNITY): Payer: Self-pay

## 2016-01-05 ENCOUNTER — Emergency Department (HOSPITAL_COMMUNITY): Payer: BLUE CROSS/BLUE SHIELD

## 2016-01-05 ENCOUNTER — Emergency Department (HOSPITAL_COMMUNITY)
Admission: EM | Admit: 2016-01-05 | Discharge: 2016-01-05 | Disposition: A | Discharge status: Home / Self Care | Payer: BLUE CROSS/BLUE SHIELD | Attending: Emergency Medicine | Admitting: Emergency Medicine

## 2016-01-05 DIAGNOSIS — H10022 Other mucopurulent conjunctivitis, left eye: Secondary | ICD-10-CM | POA: Insufficient documentation

## 2016-01-05 DIAGNOSIS — R05 Cough: Secondary | ICD-10-CM

## 2016-01-05 DIAGNOSIS — J449 Chronic obstructive pulmonary disease, unspecified: Secondary | ICD-10-CM | POA: Diagnosis not present

## 2016-01-05 DIAGNOSIS — J069 Acute upper respiratory infection, unspecified: Secondary | ICD-10-CM | POA: Diagnosis not present

## 2016-01-05 DIAGNOSIS — R059 Cough, unspecified: Secondary | ICD-10-CM

## 2016-01-05 LAB — I-STAT BETA HCG BLOOD, ED (MC, WL, AP ONLY)

## 2016-01-05 MED ORDER — PREDNISONE 10 MG PO TABS: 40.0000 mg | ORAL_TABLET | Freq: Every day | ORAL | 0 refills | Status: DC

## 2016-01-05 MED ORDER — PREDNISONE 20 MG PO TABS
60.0000 mg | ORAL_TABLET | Freq: Once | ORAL | Status: AC
  Administered 2016-01-05: 60 mg via ORAL
  Filled 2016-01-05: qty 3

## 2016-01-05 MED ORDER — ERYTHROMYCIN 5 MG/GM OP OINT: TOPICAL_OINTMENT | OPHTHALMIC | 0 refills | Status: DC

## 2016-01-05 NOTE — ED Triage Notes (Signed)
Pt c/o cough and nasal/chest congestion x over 1 week and L eye redness and drainage x 3 days.  Pain score 6/10.  Pt has been taking OTC medications w/o relief.  Hx of COPD and chronic lung disease.

## 2016-01-05 NOTE — Discharge Instructions (Signed)
Take the prednisone as prescribed and use the erythromycin eye ointment as prescribed. Follow-up with your pulmonologist in 2-3 days to be reevaluated if you're symptoms are not improving. Continue to use your inhalers as prescribed. Return to the emergency department if you experience worsening cough, shortness of breath, chest pain, fever, or any other concerning symptoms.

## 2016-01-05 NOTE — ED Provider Notes (Signed)
[facility] DEPT Provider Note   CSN: [id number] Arrival date & time: 01/05/16  1722     History   Chief Complaint Chief Complaint  Patient presents with  . Cough  . Nasal Congestion  . Chest Congestion    HPI Tammie Wade is a 20 y.o. female.  HPI   Patient is a 20 year old female with a history of COPD, emphysema, bronchopulmonary dysplasia who presents the emergency department with progressively worsening productive cough for 7 days. Associated fatigue, Sinus congestion, postnasal drainage, rhinorrhea, intermittent blood tinged sputum, sore throat that has resolved, left eye redness, discharge, itching. Patient has tried Zyrtec, Mucinex, Sudafed without relief. She states that her inhalers Spiriva and albuterol do give her some relief from her cough. Mom had similar symptoms recently. Patient is followed by Dr. Creig Hines, pulmonologist at [facility] pulmonology. Patient denies fever, abdominal pain, vomiting, headache, dizziness, syncope, difficulty swallowing.  Past Medical History:  Diagnosis Date  . Abnormal uterine bleeding   . Blood transfusion without reported diagnosis   . Bronchopulmonary dysplasia   . Chronic lung disease   . 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 COPD (chronic obstructive pulmonary disease) ([place])   . Emphysema of lung ([place])   . History of seizures   . Meconium aspiration   . Prediabetes   . Thrombocytopenia [facility])     Patient Active Problem List   Diagnosis Date Noted  . Menorrhagia with irregular cycle 11/20/2015  . 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 Emphysema lung ([place]) 07/12/2015  . GERD (gastroesophageal reflux disease) 07/12/2015  . 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 CARDIAC SURGERY    . EYE SURGERY    . VSD REPAIR     and PDA repair    OB History    Gravida Para Term Preterm AB Living   0 0 0 0 0 0   SAB TAB Ectopic  Multiple Live Births   0 0 0 0         Home Medications    Prior to Admission medications   Medication Sig Start Date End Date Taking? Authorizing Provider  albuterol (PROVENTIL HFA;VENTOLIN HFA) 108 (90 Base) MCG/ACT inhaler Inhale 2 puffs into the lungs every 6 (six) hours as needed for wheezing or shortness of breath. 07/02/15  Yes [name], NP  cetirizine (ZYRTEC ALLERGY) 10 MG tablet Take 1 tablet (10 mg total) by mouth daily. Patient taking differently: Take 10 mg by mouth at bedtime.  08/23/15  Yes Vickie Hedy Camara, NP  LOW-OGESTREL 0.3-30 MG-MCG tablet Take 1 tablet by mouth daily. 11/19/15  Yes Megan Salon, MD  omeprazole (PRILOSEC) 40 MG capsule TAKE ONE CAPSULE BY MOUTH EVERY DAY Patient taking differently: TAKE 26m CAPSULE BY MOUTH once daily as needed for acid reflux/heartburn 08/22/15  Yes VGirtha Rm NP  tiotropium (SPIRIVA) 18 MCG inhalation capsule Place 1 capsule (18 mcg total) into inhaler and inhale daily. 12/19/15  Yes JJavier Glazier MD  erythromycin ophthalmic ointment Place a 1/2 inch ribbon of ointment into the lower eyelid. 01/05/16   JKalman Drape PA  predniSONE (DELTASONE) 10 MG tablet Take 4 tablets (40 mg total) by mouth daily. 01/05/16   JKalman Drape PA    Family History Family History  Problem Relation Age of Onset  . Osteopenia Mother   . Hypertension Mother   . Diabetes Mother   . Breast cancer Maternal Aunt   .  Cancer Maternal Aunt   . Hyperlipidemia Maternal Aunt   . Diabetes Maternal Aunt   . Lung cancer Maternal Grandmother   . Hyperlipidemia Maternal Grandmother   . Diabetes Maternal Grandmother   . COPD Maternal Grandmother   . Emphysema Maternal Grandmother   . Sickle cell anemia Maternal Grandmother   . Hyperlipidemia Maternal Grandfather   . Diabetes Maternal Grandfather   . Hypertension Father   . Hyperlipidemia Father   . Sickle cell anemia Maternal Uncle   . Rheum arthritis Cousin     Social History Social  History  Substance Use Topics  . Smoking status: Never Smoker  . Smokeless tobacco: Never Used  . Alcohol use No     Allergies   Review of patient's allergies indicates no known allergies.   Review of Systems Review of Systems  Constitutional: Positive for fatigue. Negative for chills and fever.  HENT: Positive for congestion, postnasal drip, rhinorrhea, sinus pressure and sore throat. Negative for ear pain and trouble swallowing.   Eyes: Positive for discharge, redness and itching. Negative for photophobia and pain.  Respiratory: Positive for cough. Negative for shortness of breath.   Cardiovascular: Negative for chest pain.  Gastrointestinal: Negative for abdominal pain, nausea and vomiting.  Genitourinary: Negative for dysuria.  Musculoskeletal: Negative for neck pain.  Skin: Negative for rash.  Neurological: Negative for dizziness, syncope, weakness and headaches.     Physical Exam Updated Vital Signs BP 118/70 (BP Location: Left Arm)   Pulse 87   Temp 98.5 F (36.9 C) (Oral)   Resp 16   LMP 12/20/2015   SpO2 99%   Physical Exam  Constitutional: She appears well-developed and well-nourished. No distress.  HENT:  Head: Normocephalic and atraumatic.  Right Ear: Tympanic membrane, external ear and ear canal normal.  Left Ear: Tympanic membrane, external ear and ear canal normal.  Mouth/Throat: Uvula is midline and mucous membranes are normal. No trismus in the jaw. No uvula swelling. Posterior oropharyngeal erythema present. No oropharyngeal exudate, posterior oropharyngeal edema or tonsillar abscesses.  Eyes: EOM and lids are normal. Pupils are equal, round, and reactive to light. Left eye exhibits discharge. Right conjunctiva is not injected. Right conjunctiva has no hemorrhage. Left conjunctiva is injected. Left conjunctiva has no hemorrhage.  Cardiovascular: Normal rate, regular rhythm and normal heart sounds.   Pulmonary/Chest: Effort normal and breath sounds  normal. No respiratory distress. She has no decreased breath sounds. She has no wheezes. She has no rhonchi. She has no rales.  Abdominal: Soft. There is no tenderness.  Musculoskeletal: Normal range of motion.  Neurological: She is alert. Coordination normal.  Skin: Skin is warm and dry. She is not diaphoretic.  Psychiatric: She has a normal mood and affect. Her behavior is normal.  Nursing note and vitals reviewed.    ED Treatments / Results  Labs (all labs ordered are listed, but only abnormal results are displayed) Labs Reviewed  I-STAT BETA HCG BLOOD, ED ([facility], [facility], [facility] ONLY)    EKG  EKG Interpretation None       Radiology Dg Chest 2 View  Result Date: 01/05/2016 CLINICAL DATA:  Chest and sinus congestion for 1 week. Productive cough. EXAM: CHEST  2 VIEW COMPARISON:  Chest CT 07/23/2015 FINDINGS: Chronic hyperinflation and peripheral lucency. History of repaired congenital heart disease with chronic enlargement of the left atrial appendage and left main pulmonary artery. Chronic mild scarring at the bases. There is no edema, consolidation, effusion, or pneumothorax. IMPRESSION: 1. No acute finding.  2. Chronic hyperinflation. 3. Repaired congenital heart disease. Electronically Signed   By: Monte Fantasia M.D.   On: 01/05/2016 18:11    Procedures Procedures (including critical care time)  Medications Ordered in ED Medications  predniSONE (DELTASONE) tablet 60 mg (60 mg Oral Given 01/05/16 1923)     Initial Impression / Assessment and Plan / ED Course  I have reviewed the triage vital signs and the nursing notes.  Pertinent labs & imaging results that were available during my care of the patient were reviewed by me and considered in my medical decision making (see chart for details).  Clinical Course   Pt CXR negative for acute infiltrate. Patient afebrile, VSS, well-appearing. Patients symptoms are consistent with URI, likely viral etiology Including viral  conjunctivitis. Discussed that antibiotics are not indicated for viral infections. Pt was given prednisone in the ED and vomited it up. Likely due to taste but pt states she is allergic. Will not d/c with steriods but instructed pt to continue inhalers and OTC therapy with f/u with her pulmonologist tomorrow or Tuesday. Discharged with erythromycin ointment for eye. Likely viral conjunctivitis but Mom wants eye treated.   Discussed strict return precautions to the ED. Verbalizes understanding and is agreeable with plan. Pt is hemodynamically stable & in NAD prior to dc.  Patient has discussed the patient seen by Dr. Rogene Houston who agrees with the above plan.  Final Clinical Impressions(s) / ED Diagnoses   Final diagnoses:  Cough  Acute upper respiratory infection  Other mucopurulent conjunctivitis of left eye    New Prescriptions New Prescriptions   ERYTHROMYCIN OPHTHALMIC OINTMENT    Place a 1/2 inch ribbon of ointment into the lower eyelid.   PREDNISONE (DELTASONE) 10 MG TABLET    Take 4 tablets (40 mg total) by mouth daily.     Kalman Drape, [place] 01/07/16 [place], MD 01/08/16 1933

## 2016-01-05 NOTE — ED Provider Notes (Signed)
Medical screening examination/treatment/procedure(s) were conducted as a shared visit with non-physician practitioner(s) and myself.  I personally evaluated the patient during the encounter.   EKG Interpretation None      Results for orders placed or performed during the hospital encounter of 01/05/16  I-Stat beta hCG blood, ED  Result Value Ref Range   I-stat hCG, quantitative <5.0 <5 mIU/mL   Comment 3           Dg Chest 2 View  Result Date: 01/05/2016 CLINICAL DATA:  Chest and sinus congestion for 1 week. Productive cough. EXAM: CHEST  2 VIEW COMPARISON:  Chest CT 07/23/2015 FINDINGS: Chronic hyperinflation and peripheral lucency. History of repaired congenital heart disease with chronic enlargement of the left atrial appendage and left main pulmonary artery. Chronic mild scarring at the bases. There is no edema, consolidation, effusion, or pneumothorax. IMPRESSION: 1. No acute finding. 2. Chronic hyperinflation. 3. Repaired congenital heart disease. Electronically Signed   By: Monte Fantasia M.D.   On: 01/05/2016 18:11    Patient symptoms seem to be consistent with a viral upper respiratory infection with a left-sided viral conjunctivitis and bronchitis. Patient currently using albuterol inhaler. Patient also taking Mucinex DM. Would recommend adding on a course of prednisone. 60 mg prednisone to be provided here and will continue 40 for the next 5 days. Return for any new or worse symptoms.  Patient is a contact wearer and an acute amount of her eyes.  Lungs are clear bilaterally no wheezing currently. Other family members with similar illness.   Fredia Sorrow, MD 01/05/16 [place]

## 2016-01-07 NOTE — Assessment & Plan Note (Deleted)
Congenital thrombocytopenia: Patient had previously been evaluated in [place] and subsequently in [place]. She tells me that her platelet counts ranged from 60-90,000. Her problems with bleeding were severe during menarche but have subsequently improved after being treated with birth control pills. She does not have any current problems with bruising or bleeding.  Most recent platelet count was 82  Plan: An extensive workup by Dr. Melvenia Beam (in [place]) in the past showed no qualitative platelet defect (normal platelet aggregation testing) and no evidence of ITP. Bone marrow testing in the past has shown normal megakaryocytes, and there are no other findings on her CBC today suggest new marrow dysplasias.   The differential diagnosis for congenital thrombocytopenia as is extremely wide which include amegakaryocytic thrombocytopenia especially because of the fact that she has hearing impairment. Other X-linked thrombocytopenias are also possible as well as MYH-9 -related diseases.  I will see her back in one year  with recheck of her CBC with differential.

## 2016-01-09 ENCOUNTER — Other Ambulatory Visit: Payer: BLUE CROSS/BLUE SHIELD

## 2016-01-09 ENCOUNTER — Ambulatory Visit: Payer: BLUE CROSS/BLUE SHIELD | Admitting: Hematology and Oncology

## 2016-01-16 ENCOUNTER — Ambulatory Visit: Payer: BLUE CROSS/BLUE SHIELD | Admitting: Hematology and Oncology

## 2016-01-16 ENCOUNTER — Other Ambulatory Visit: Payer: BLUE CROSS/BLUE SHIELD

## 2016-01-21 ENCOUNTER — Ambulatory Visit (HOSPITAL_BASED_OUTPATIENT_CLINIC_OR_DEPARTMENT_OTHER): Payer: BLUE CROSS/BLUE SHIELD | Admitting: Hematology and Oncology

## 2016-01-21 ENCOUNTER — Encounter: Payer: Self-pay | Admitting: Hematology and Oncology

## 2016-01-21 ENCOUNTER — Other Ambulatory Visit (HOSPITAL_BASED_OUTPATIENT_CLINIC_OR_DEPARTMENT_OTHER): Payer: BLUE CROSS/BLUE SHIELD

## 2016-01-21 VITALS — BP 107/55 | HR 86 | Temp 97.6°F | Resp 18 | Wt 135.0 lb

## 2016-01-21 DIAGNOSIS — D696 Thrombocytopenia, unspecified: Secondary | ICD-10-CM

## 2016-01-21 DIAGNOSIS — D6942 Congenital and hereditary thrombocytopenia purpura: Secondary | ICD-10-CM

## 2016-01-21 DIAGNOSIS — L700 Acne vulgaris: Secondary | ICD-10-CM

## 2016-01-21 LAB — CBC WITH DIFFERENTIAL/PLATELET
BASO%: 0.6 % (ref 0.0–2.0)
BASOS ABS: 0 10*3/uL (ref 0.0–0.1)
EOS%: 0.5 % (ref 0.0–7.0)
Eosinophils Absolute: 0 10*3/uL (ref 0.0–0.5)
HEMATOCRIT: 34.7 % — AB (ref 34.8–46.6)
HEMOGLOBIN: 11.1 g/dL — AB (ref 11.6–15.9)
LYMPH#: 0.9 10*3/uL (ref 0.9–3.3)
LYMPH%: 17.3 % (ref 14.0–49.7)
MCH: 27.5 pg (ref 25.1–34.0)
MCHC: 32.1 g/dL (ref 31.5–36.0)
MCV: 85.7 fL (ref 79.5–101.0)
MONO#: 0.5 10*3/uL (ref 0.1–0.9)
MONO%: 9.1 % (ref 0.0–14.0)
NEUT#: 3.9 10*3/uL (ref 1.5–6.5)
NEUT%: 72.5 % (ref 38.4–76.8)
Platelets: 98 10*3/uL — ABNORMAL LOW (ref 145–400)
RBC: 4.04 10*6/uL (ref 3.70–5.45)
RDW: 13.5 % (ref 11.2–14.5)
WBC: 5.4 10*3/uL (ref 3.9–10.3)

## 2016-01-21 LAB — TECHNOLOGIST REVIEW

## 2016-01-21 NOTE — Assessment & Plan Note (Signed)
Congenital thrombocytopenia: Patient had previously been evaluated in [place] and subsequently in [place]. She tells me that her platelet counts ranged from 60-90,000. Her problems with bleeding were severe during menarche but have subsequently improved after being treated with birth control pills. She does not have any current problems with bruising or bleeding.  Blood work review: platelet count is 82  Prior workup: An extensive workup by Dr. Melvenia Beam (in [place]) in the past showed no qualitative platelet defect (normal platelet aggregation testing) and no evidence of ITP. Bone marrow testing in the past has shown normal megakaryocytes, and there are no other findings on her CBC today suggest new marrow dysplasias.   Differential diagnosis for congenital thrombocytopenia as is extremely wide which include amegakaryocytic thrombocytopenia especially because of the fact that she has hearing impairment. Other X-linked thrombocytopenias are also possible as well as MYH-9 -related diseases.  I will see her back in 6 months with recheck of her CBC with differential.

## 2016-01-21 NOTE — Progress Notes (Signed)
Patient Care Team: [name], NP as PCP - General (Family Medicine)  DIAGNOSIS:  Encounter Diagnosis  Name Primary?  . Congenital thrombocytopenia (HCC)     Congenital thrombocytopenia  HISTORY OF PRESENTING ILLNESS:  Tammie Wade 20 y.o. female is here because of moving to [place] in with a history of congenital thrombocytopenia. She has had long standing medical problems including chronic heart disease, lung disease, hearing loss, all apparently related to meconium aspiration during delivery of the baby. She moved to [facility] along with her mother. He wanted to set up hematology follow-ups here. They tell me that she was born with low platelets and had undergone bone marrow biopsies which have shown no clear evidence of specific cause of thrombocytopenia. When she was younger she had lots of bleeding problems which would later controlled with oral contraceptive pills. She doesn't have any current concerns with bruising or bleeding.  REVIEW OF SYSTEMS:   Constitutional: Denies fevers, chills or abnormal weight loss Eyes: Denies blurriness of vision Ears, nose, mouth, throat, and face: Denies mucositis or sore throat Respiratory: Denies cough, dyspnea or wheezes Cardiovascular: Denies palpitation, chest discomfort Gastrointestinal:  Denies nausea, heartburn or change in bowel habits Skin: Denies abnormal skin rashes Lymphatics: Denies new lymphadenopathy or easy bruising Neurological:Denies numbness, tingling or new weaknesses Behavioral/Psych: Mood is stable, no new changes  Extremities: No lower extremity edema  All other systems were reviewed with the patient and are negative.  I have reviewed the past medical history, past surgical history, social history and family history with the patient and they are unchanged from previous note.  ALLERGIES:  has No Known Allergies.  MEDICATIONS:  Current Outpatient Prescriptions  Medication Sig Dispense Refill  .  albuterol (PROVENTIL HFA;VENTOLIN HFA) 108 (90 Base) MCG/ACT inhaler Inhale 2 puffs into the lungs every 6 (six) hours as needed for wheezing or shortness of breath. 1 Inhaler 2  . cetirizine (ZYRTEC ALLERGY) 10 MG tablet Take 1 tablet (10 mg total) by mouth daily. (Patient taking differently: Take 10 mg by mouth at bedtime. ) 90 tablet 3  . erythromycin ophthalmic ointment Place a 1/2 inch ribbon of ointment into the lower eyelid. 3.5 g 0  . LOW-OGESTREL 0.3-30 MG-MCG tablet Take 1 tablet by mouth daily. 1 Package 3  . omeprazole (PRILOSEC) 40 MG capsule TAKE ONE CAPSULE BY MOUTH EVERY DAY (Patient taking differently: TAKE 28m CAPSULE BY MOUTH once daily as needed for acid reflux/heartburn) 30 capsule 1  . predniSONE (DELTASONE) 10 MG tablet Take 4 tablets (40 mg total) by mouth daily. 20 tablet 0  . tiotropium (SPIRIVA) 18 MCG inhalation capsule Place 1 capsule (18 mcg total) into inhaler and inhale daily. 30 capsule 6   No current facility-administered medications for this visit.     PHYSICAL EXAMINATION: ECOG PERFORMANCE STATUS: 0 - Asymptomatic  Vitals:   01/21/16 1358  BP: (!) 107/55  Pulse: 86  Resp: 18  Temp: 97.6 F (36.4 C)   Filed Weights   01/21/16 1358  Weight: 135 lb (61.2 kg)    GENERAL:alert, no distress and comfortable SKIN: skin color, texture, turgor are normal, no rashes or significant lesions EYES: normal, Conjunctiva are pink and non-injected, sclera clear OROPHARYNX:no exudate, no erythema and lips, buccal mucosa, and tongue normal  NECK: supple, thyroid normal size, non-tender, without nodularity LYMPH:  no palpable lymphadenopathy in the cervical, axillary or inguinal LUNGS: clear to auscultation and percussion with normal breathing effort HEART: regular rate & rhythm and no  murmurs and no lower extremity edema ABDOMEN:abdomen soft, non-tender and normal bowel sounds MUSCULOSKELETAL:no cyanosis of digits and no clubbing  NEURO: alert & oriented x 3 with  fluent speech, no focal motor/sensory deficits EXTREMITIES: No lower extremity edema  LABORATORY DATA:  I have reviewed the data as listed   Chemistry      Component Value Date/Time   NA 134 (L) 07/02/2015 0001   K 4.2 07/02/2015 0001   CL 104 07/02/2015 0001   CO2 24 07/02/2015 0001   BUN 10 07/02/2015 0001   CREATININE 0.76 07/02/2015 0001      Component Value Date/Time   CALCIUM 8.6 07/02/2015 0001   ALKPHOS 28 (L) 07/02/2015 0001   AST 19 07/02/2015 0001   ALT 15 07/02/2015 0001   BILITOT 1.0 07/02/2015 0001       Lab Results  Component Value Date   WBC 5.4 01/21/2016   HGB 11.1 (L) 01/21/2016   HCT 34.7 (L) 01/21/2016   MCV 85.7 01/21/2016   PLT 98 (L) 01/21/2016   NEUTROABS 3.9 01/21/2016     ASSESSMENT & PLAN:  Congenital thrombocytopenia ([place]) Congenital thrombocytopenia: Patient had previously been evaluated in [place] and subsequently in [place]. She tells me that her platelet counts ranged from 60-90,000. Her problems with bleeding were severe during menarche but have subsequently improved after being treated with birth control pills. She does not have any current problems with bruising or bleeding.  Blood work review: platelet count is 98  Prior workup: An extensive workup by Dr. Melvenia Beam (in [place]) in the past showed no qualitative platelet defect (normal platelet aggregation testing) and no evidence of ITP. Bone marrow testing in the past has shown normal megakaryocytes, and there are no other findings on her CBC today suggest new marrow dysplasias.   Differential diagnosis for congenital thrombocytopenia as is extremely wide which include amegakaryocytic thrombocytopenia especially because of the fact that she has hearing impairment. Other X-linked thrombocytopenias are also possible as well as MYH-9 -related diseases.  I will see her back in 6 months with recheck of her CBC with differential.   No orders of the defined types were placed in  this encounter.  The patient has a good understanding of the overall plan. she agrees with it. she will call with any problems that may develop before the next visit here.   Rulon Eisenmenger, MD 01/21/16

## 2016-02-29 ENCOUNTER — Other Ambulatory Visit: Payer: Self-pay | Admitting: Family Medicine

## 2016-02-29 DIAGNOSIS — J984 Other disorders of lung: Secondary | ICD-10-CM

## 2016-03-02 NOTE — Telephone Encounter (Signed)
Ok to refill 

## 2016-03-24 ENCOUNTER — Encounter (HOSPITAL_COMMUNITY): Payer: Self-pay | Admitting: Family Medicine

## 2016-03-24 ENCOUNTER — Ambulatory Visit (HOSPITAL_COMMUNITY)
Admission: EM | Admit: 2016-03-24 | Discharge: 2016-03-24 | Disposition: A | Discharge status: Home / Self Care | Payer: Medicaid Other | Attending: Family Medicine | Admitting: Family Medicine

## 2016-03-24 DIAGNOSIS — J0100 Acute maxillary sinusitis, unspecified: Secondary | ICD-10-CM

## 2016-03-24 MED ORDER — IPRATROPIUM BROMIDE 0.06 % NA SOLN: 2.0000 | Freq: Four times a day (QID) | NASAL | 1 refills | Status: DC

## 2016-03-24 MED ORDER — DOXYCYCLINE HYCLATE 100 MG PO CAPS: 100.0000 mg | ORAL_CAPSULE | Freq: Two times a day (BID) | ORAL | 0 refills | Status: DC

## 2016-03-24 NOTE — ED Provider Notes (Signed)
[facility]    CSN: [id number] Arrival date & time: 03/24/16  1119     History   Chief Complaint Chief Complaint  Patient presents with  . Facial Pain  . Nasal Congestion    HPI Tammie Wade is a 20 y.o. female.   The history is provided by the patient.  URI  Presenting symptoms: congestion, cough, facial pain, rhinorrhea and sore throat   Presenting symptoms: no fever   Severity:  Mild Onset quality:  Sudden Duration:  3 days Chronicity:  New Relieved by:  None tried Worsened by:  Nothing Ineffective treatments:  None tried   Past Medical History:  Diagnosis Date  . Abnormal uterine bleeding   . Blood transfusion without reported diagnosis   . Bronchopulmonary dysplasia   . Chronic lung disease   . Congenital thrombocytopenia ([place])   . COPD (chronic obstructive pulmonary disease) ([place])   . Emphysema of lung ([place])   . History of seizures   . Meconium aspiration   . Prediabetes   . Thrombocytopenia [facility])     Patient Active Problem List   Diagnosis Date Noted  . Menorrhagia with irregular cycle 11/20/2015  . 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 COPD, severe ([place]) 07/12/2015  . Emphysema lung ([place]) 07/12/2015  . GERD (gastroesophageal reflux disease) 07/12/2015  . Congenital thrombocytopenia ([place]) 07/10/2015    Past Surgical History:  Procedure Laterality Date  . CARDIAC SURGERY    . EYE SURGERY    . VSD REPAIR     and PDA repair    OB History    Gravida Para Term Preterm AB Living   0 0 0 0 0 0   SAB TAB Ectopic Multiple Live Births   0 0 0 0         Home Medications    Prior to Admission medications   Medication Sig Start Date End Date Taking? Authorizing Provider  cetirizine (ZYRTEC ALLERGY) 10 MG tablet Take 1 tablet (10 mg total) by mouth daily. Patient taking differently: Take 10 mg by mouth at bedtime.  08/23/15   [name], NP  doxycycline (VIBRAMYCIN) 100 MG capsule Take 1 capsule (100 mg total) by mouth 2 (two)  times daily. 03/24/16   Billy Fischer, MD  erythromycin ophthalmic ointment Place a 1/2 inch ribbon of ointment into the lower eyelid. 01/05/16   Jessica L Focht, PA  ipratropium (ATROVENT) 0.06 % nasal spray Place 2 sprays into both nostrils 4 (four) times daily. 03/24/16   Billy Fischer, MD  LOW-OGESTREL 0.3-30 MG-MCG tablet Take 1 tablet by mouth daily. 11/19/15   Megan Salon, MD  omeprazole (PRILOSEC) 40 MG capsule TAKE ONE CAPSULE BY MOUTH EVERY DAY Patient taking differently: TAKE 90m CAPSULE BY MOUTH once daily as needed for acid reflux/heartburn 08/22/15   VGirtha Rm NP  predniSONE (DELTASONE) 10 MG tablet Take 4 tablets (40 mg total) by mouth daily. 01/05/16   Jessica L Focht, PA  PROAIR HFA 108 (90 Base) MCG/ACT inhaler INHALE 2 PUFFS INTO THE LUNGS EVERY 6 HOURS AS NEEDED FOR WHEEZING OR SHORTNESS OF BREATH 03/02/16   VGirtha Rm NP  tiotropium (SPIRIVA) 18 MCG inhalation capsule Place 1 capsule (18 mcg total) into inhaler and inhale daily. 12/19/15   JJavier Glazier MD    Family History Family History  Problem Relation Age of Onset  . Osteopenia Mother   . Hypertension Mother   . Diabetes Mother   . Breast cancer Maternal Aunt   .  Cancer Maternal Aunt   . Hyperlipidemia Maternal Aunt   . Diabetes Maternal Aunt   . Lung cancer Maternal Grandmother   . Hyperlipidemia Maternal Grandmother   . Diabetes Maternal Grandmother   . COPD Maternal Grandmother   . Emphysema Maternal Grandmother   . Sickle cell anemia Maternal Grandmother   . Hyperlipidemia Maternal Grandfather   . Diabetes Maternal Grandfather   . Hypertension Father   . Hyperlipidemia Father   . Sickle cell anemia Maternal Uncle   . Rheum arthritis Cousin     Social History Social History  Substance Use Topics  . Smoking status: Never Smoker  . Smokeless tobacco: Never Used  . Alcohol use No     Allergies   Patient has no known allergies.   Review of Systems Review of Systems    Constitutional: Negative for fever.  HENT: Positive for congestion, postnasal drip, rhinorrhea and sore throat.   Respiratory: Positive for cough.   Cardiovascular: Negative.   Gastrointestinal: Negative.      Physical Exam Triage Vital Signs ED Triage Vitals [03/24/16 1152]  Enc Vitals Group     BP 131/64     Pulse Rate 72     Resp 16     Temp 98.9 F (37.2 C)     Temp src      SpO2 100 %     Weight      Height      Head Circumference      Peak Flow      Pain Score      Pain Loc      Pain Edu?      Excl. in [place]?    No data found.   Updated Vital Signs BP 131/64   Pulse 72   Temp 98.9 F (37.2 C)   Resp 16   SpO2 100%   Visual Acuity Right Eye Distance:   Left Eye Distance:   Bilateral Distance:    Right Eye Near:   Left Eye Near:    Bilateral Near:     Physical Exam  Constitutional: She is oriented to person, place, and time. She appears well-developed and well-nourished.  HENT:  Right Ear: External ear normal.  Left Ear: External ear normal.  Nose: Nose normal.  Mouth/Throat: Oropharynx is clear and moist.  Eyes: Pupils are equal, round, and reactive to light.  Neck: Normal range of motion. Neck supple.  Cardiovascular: Normal rate, regular rhythm, normal heart sounds and intact distal pulses.   Pulmonary/Chest: Effort normal and breath sounds normal.  Lymphadenopathy:    She has no cervical adenopathy.  Neurological: She is alert and oriented to person, place, and time.  Skin: Skin is warm and dry.  Nursing note and vitals reviewed.    UC Treatments / Results  Labs (all labs ordered are listed, but only abnormal results are displayed) Labs Reviewed - No data to display  EKG  EKG Interpretation None       Radiology No results found.  Procedures Procedures (including critical care time)  Medications Ordered in UC Medications - No data to display   Initial Impression / Assessment and Plan / UC Course  I have reviewed the  triage vital signs and the nursing notes.  Pertinent labs & imaging results that were available during my care of the patient were reviewed by me and considered in my medical decision making (see chart for details).  Clinical Course       Final Clinical Impressions(s) / UC  Diagnoses   Final diagnoses:  Acute maxillary sinusitis, recurrence not specified    New Prescriptions Discharge Medication List as of 03/24/2016 12:35 PM    START taking these medications   Details  doxycycline (VIBRAMYCIN) 100 MG capsule Take 1 capsule (100 mg total) by mouth 2 (two) times daily., Starting Tue 03/24/2016, Print    ipratropium (ATROVENT) 0.06 % nasal spray Place 2 sprays into both nostrils 4 (four) times daily., Starting Tue 03/24/2016, Print         Billy Fischer, MD 03/29/16 [phone]

## 2016-03-24 NOTE — ED Triage Notes (Signed)
Pt here for URI symptoms with sinus pain.

## 2016-06-13 ENCOUNTER — Other Ambulatory Visit: Payer: Self-pay | Admitting: Family Medicine

## 2016-06-13 ENCOUNTER — Other Ambulatory Visit: Payer: Self-pay | Admitting: Obstetrics and Gynecology

## 2016-06-13 DIAGNOSIS — Z3041 Encounter for surveillance of contraceptive pills: Secondary | ICD-10-CM

## 2016-06-13 MED ORDER — LOW-OGESTREL 0.3-30 MG-MCG PO TABS: 1.0000 | ORAL_TABLET | Freq: Every day | ORAL | 1 refills | Status: DC

## 2016-07-06 ENCOUNTER — Other Ambulatory Visit: Payer: Self-pay | Admitting: Obstetrics and Gynecology

## 2016-07-06 NOTE — Telephone Encounter (Signed)
Medication refill request: Low-Ogestrel Last AEX:  07/11/15 JJ Next AEX: 07/16/16 JJ Last MMG (if hormonal medication request): n/a Refill authorized: 06/13/16 #1 1R. Please advise. Thank you.

## 2016-07-06 NOTE — Telephone Encounter (Signed)
3 packs with no refills sent to the pharmacy

## 2016-07-15 ENCOUNTER — Telehealth: Payer: Self-pay | Admitting: Hematology and Oncology

## 2016-07-15 NOTE — Telephone Encounter (Signed)
Call day - moved 4/17 appointments to 4/24. Spoke with patient

## 2016-07-16 ENCOUNTER — Ambulatory Visit: Payer: BLUE CROSS/BLUE SHIELD | Admitting: Obstetrics and Gynecology

## 2016-07-16 ENCOUNTER — Ambulatory Visit (INDEPENDENT_AMBULATORY_CARE_PROVIDER_SITE_OTHER): Payer: BLUE CROSS/BLUE SHIELD | Admitting: Obstetrics and Gynecology

## 2016-07-16 ENCOUNTER — Encounter: Payer: Self-pay | Admitting: Obstetrics and Gynecology

## 2016-07-16 VITALS — BP 118/70 | HR 80 | Resp 16 | Ht 64.75 in | Wt 132.0 lb

## 2016-07-16 DIAGNOSIS — Z01419 Encounter for gynecological examination (general) (routine) without abnormal findings: Secondary | ICD-10-CM

## 2016-07-16 DIAGNOSIS — Z3041 Encounter for surveillance of contraceptive pills: Secondary | ICD-10-CM | POA: Diagnosis not present

## 2016-07-16 DIAGNOSIS — Z23 Encounter for immunization: Secondary | ICD-10-CM | POA: Diagnosis not present

## 2016-07-16 MED ORDER — NORGESTREL-ETHINYL ESTRADIOL 0.3-30 MG-MCG PO TABS: 1.0000 | ORAL_TABLET | Freq: Every day | ORAL | 3 refills | Status: DC

## 2016-07-16 NOTE — Addendum Note (Signed)
Addended by: Terence Lux A on: 07/16/2016 02:17 PM   Modules accepted: Orders

## 2016-07-16 NOTE — Progress Notes (Signed)
21 y.o. G0P0000 SingleAfrican AmericanF here for annual exam.   On OPC's. Menses q month x 6 days, she changes her pad every 2-3 hours. She has mild cramps, helped with tylenol. No BTB.  Never sexually active.  She c/o some numbness in both hands, she has seen her primary about this. Her primary recommended further evaluation if her symptoms didn't improve with conservative treatment.  She has developmental delays.  She is followed by hematology for congenital thrombocytopenia. She has a h/o menorrhagia prior to OCP's and had abnormal bleeding on a lower dose OCP. She had a negative [employer] panel. Her bleeding is fine on her current OCP.    Patient's last menstrual period was 07/08/2016.          Sexually active: No.  The current method of family planning is OCP (estrogen/progesterone) and abstinence.    Exercising: Yes.    treadmill Smoker:  no  Health Maintenance: Pap:  11/20/15 WNL History of abnormal Pap:  no MMG:  n/a Colonoscopy:  n/a BMD:   n/a TDaP:  Up to date Gardasil: 07/11/15 only   reports that she has never smoked. She has never used smokeless tobacco. She reports that she does not drink alcohol or use drugs.She is living with her parents, working at [employer].   Past Medical History:  Diagnosis Date  . Abnormal uterine bleeding   . Blood transfusion without reported diagnosis   . Bronchopulmonary dysplasia   . Chronic lung disease   . Congenital thrombocytopenia ([place])   . COPD (chronic obstructive pulmonary disease) ([place])   . Emphysema of lung ([place])   . History of seizures   . Meconium aspiration   . Prediabetes   . Thrombocytopenia ([place])     Past Surgical History:  Procedure Laterality Date  . CARDIAC SURGERY    . EYE SURGERY    . VSD REPAIR     and PDA repair    Current Outpatient Prescriptions  Medication Sig Dispense Refill  . cetirizine (ZYRTEC ALLERGY) 10 MG tablet Take 1 tablet (10 mg total) by mouth daily. (Patient taking differently:  Take 10 mg by mouth at bedtime. ) 90 tablet 3  . LOW-OGESTREL 0.3-30 MG-MCG tablet TAKE 1 TABLET BY MOUTH DAILY 84 tablet 0  . Multiple Vitamin (MULTIVITAMIN WITH MINERALS) TABS tablet Take 1 tablet by mouth daily.    Marland Kitchen PROAIR HFA 108 (90 Base) MCG/ACT inhaler INHALE 2 PUFFS INTO THE LUNGS EVERY 6 HOURS AS NEEDED FOR WHEEZING OR SHORTNESS OF BREATH 8.5 g 0  . tiotropium (SPIRIVA) 18 MCG inhalation capsule Place 1 capsule (18 mcg total) into inhaler and inhale daily. 30 capsule 6  . doxycycline (VIBRAMYCIN) 100 MG capsule Take 1 capsule (100 mg total) by mouth 2 (two) times daily. 20 capsule 0  . erythromycin ophthalmic ointment Place a 1/2 inch ribbon of ointment into the lower eyelid. 3.5 g 0  . omeprazole (PRILOSEC) 40 MG capsule TAKE ONE CAPSULE BY MOUTH EVERY DAY (Patient taking differently: TAKE 39m CAPSULE BY MOUTH once daily as needed for acid reflux/heartburn) 30 capsule 1  . predniSONE (DELTASONE) 10 MG tablet Take 4 tablets (40 mg total) by mouth daily. 20 tablet 0   No current facility-administered medications for this visit.     Family History  Problem Relation Age of Onset  . Osteopenia Mother   . Hypertension Mother   . Diabetes Mother   . Breast cancer Maternal Aunt   . Cancer Maternal Aunt   .  Hyperlipidemia Maternal Aunt   . Diabetes Maternal Aunt   . Lung cancer Maternal Grandmother   . Hyperlipidemia Maternal Grandmother   . Diabetes Maternal Grandmother   . COPD Maternal Grandmother   . Emphysema Maternal Grandmother   . Sickle cell anemia Maternal Grandmother   . Hyperlipidemia Maternal Grandfather   . Diabetes Maternal Grandfather   . Hypertension Father   . Hyperlipidemia Father   . Sickle cell anemia Maternal Uncle   . Rheum arthritis Cousin     Review of Systems  Constitutional: Negative.   HENT: Negative.   Eyes: Negative.   Respiratory: Negative.   Cardiovascular: Negative.   Gastrointestinal: Negative.   Endocrine: Negative.   Genitourinary:  Negative.   Musculoskeletal: Negative.   Skin: Negative.   Allergic/Immunologic: Negative.   Neurological: Negative.   Hematological: Negative.   Psychiatric/Behavioral: Negative.     Exam:   BP 118/70 (BP Location: Right Arm, Patient Position: Sitting, Cuff Size: Normal)   Pulse 80   Resp 16   Ht 5' 4.75" (1.645 m)   Wt 132 lb (59.9 kg)   LMP 07/08/2016   BMI 22.14 kg/m   Weight change: _0 @ Height:   Height: 5' 4.75" (164.5 cm)  Ht Readings from Last 3 Encounters:  07/16/16 5' 4.75" (1.645 m)  11/19/15 5' 4.5" (1.638 m)  08/13/15 5' 5.5" (1.664 m)    General appearance: alert, cooperative and appears stated age Head: Normocephalic, without obvious abnormality, atraumatic Neck: no adenopathy, supple, symmetrical, trachea midline and thyroid normal to inspection and palpation Lungs: clear to auscultation bilaterally Cardiovascular: regular rate and rhythm Breasts: normal appearance, no masses or tenderness Abdomen: soft, non-tender; bowel sounds normal; no masses,  no organomegaly Extremities: extremities normal, atraumatic, no cyanosis or edema Skin: Skin color, texture, turgor normal. No rashes or lesions Lymph nodes: Cervical, supraclavicular, and axillary nodes normal. No abnormal inguinal nodes palpated Neurologic: Grossly normal Pelvic: deferred this year. Normal pap and pelvic in 8/17. No c/o, never sexually active   Chaperone was present for exam.  A:  Well Woman with normal exam  H/O menorrhagia, controlled with OCP's  P:   No pap this year  Continue OCP's  She gets screening labs with her primary MD and hematology  Discussed condom use if she does become sexually active  She will get her second gardasil shot today  Return in 4 months for her last gardasil shot

## 2016-07-16 NOTE — Patient Instructions (Signed)
EXERCISE AND DIET:  We recommended that you start or continue a regular exercise program for good health. Regular exercise means any activity that makes your heart beat faster and makes you sweat.  We recommend exercising at least 30 minutes per day at least 3 days a week, preferably 4 or 5.  We also recommend a diet low in fat and sugar.  Inactivity, poor dietary choices and obesity can cause diabetes, heart attack, stroke, and kidney damage, among others.    ALCOHOL AND SMOKING:  Women should limit their alcohol intake to no more than 7 drinks/beers/glasses of wine (combined, not each!) per week. Moderation of alcohol intake to this level decreases your risk of breast cancer and liver damage. And of course, no recreational drugs are part of a healthy lifestyle.  And absolutely no smoking or even second hand smoke. Most people know smoking can cause heart and lung diseases, but did you know it also contributes to weakening of your bones? Aging of your skin?  Yellowing of your teeth and nails?  CALCIUM AND VITAMIN D:  Adequate intake of calcium and Vitamin D are recommended.  The recommendations for exact amounts of these supplements seem to change often, but generally speaking 600 mg of calcium (either carbonate or citrate) and 800 units of Vitamin D per day seems prudent. Certain women may benefit from higher intake of Vitamin D.  If you are among these women, your doctor will have told you during your visit.    PAP SMEARS:  Pap smears, to check for cervical cancer or precancers,  have traditionally been done yearly, although recent scientific advances have shown that most women can have pap smears less often.  However, every woman still should have a physical exam from her gynecologist every year. It will include a breast check, inspection of the vulva and vagina to check for abnormal growths or skin changes, a visual exam of the cervix, and then an exam to evaluate the size and shape of the uterus and  ovaries.  And after 23 years of age, a rectal exam is indicated to check for rectal cancers. We will also provide age appropriate advice regarding health maintenance, like when you should have certain vaccines, screening for sexually transmitted diseases, bone density testing, colonoscopy, mammograms, etc.

## 2016-07-21 ENCOUNTER — Ambulatory Visit: Payer: BLUE CROSS/BLUE SHIELD | Admitting: Hematology and Oncology

## 2016-07-21 ENCOUNTER — Other Ambulatory Visit: Payer: BLUE CROSS/BLUE SHIELD

## 2016-07-23 ENCOUNTER — Telehealth: Payer: Self-pay | Admitting: *Deleted

## 2016-07-23 NOTE — Telephone Encounter (Signed)
"  I missed an appointment on the 17 th and need to reschedule."  Advised she did not miss an appointment.  This was rescheduled by provider.  Next appointment is 07-28-2016 beginning at 2:45 pm.  Instructed to arrive early to register.  No further questions.

## 2016-07-28 ENCOUNTER — Other Ambulatory Visit (HOSPITAL_BASED_OUTPATIENT_CLINIC_OR_DEPARTMENT_OTHER): Payer: BLUE CROSS/BLUE SHIELD

## 2016-07-28 ENCOUNTER — Encounter: Payer: Self-pay | Admitting: Hematology and Oncology

## 2016-07-28 ENCOUNTER — Ambulatory Visit (HOSPITAL_BASED_OUTPATIENT_CLINIC_OR_DEPARTMENT_OTHER): Payer: BLUE CROSS/BLUE SHIELD | Admitting: Hematology and Oncology

## 2016-07-28 DIAGNOSIS — D6942 Congenital and hereditary thrombocytopenia purpura: Secondary | ICD-10-CM

## 2016-07-28 LAB — CBC WITH DIFFERENTIAL/PLATELET
BASO%: 0.7 % (ref 0.0–2.0)
Basophils Absolute: 0 10*3/uL (ref 0.0–0.1)
EOS%: 0.4 % (ref 0.0–7.0)
Eosinophils Absolute: 0 10*3/uL (ref 0.0–0.5)
HCT: 38.5 % (ref 34.8–46.6)
HGB: 12.5 g/dL (ref 11.6–15.9)
LYMPH%: 21.6 % (ref 14.0–49.7)
MCH: 29.9 pg (ref 25.1–34.0)
MCHC: 32.5 g/dL (ref 31.5–36.0)
MCV: 92 fL (ref 79.5–101.0)
MONO#: 0.5 10*3/uL (ref 0.1–0.9)
MONO%: 9.3 % (ref 0.0–14.0)
NEUT%: 68 % (ref 38.4–76.8)
NEUTROS ABS: 3.7 10*3/uL (ref 1.5–6.5)
Platelets: 82 10*3/uL — ABNORMAL LOW (ref 145–400)
RBC: 4.18 10*6/uL (ref 3.70–5.45)
RDW: 13.6 % (ref 11.2–14.5)
WBC: 5.5 10*3/uL (ref 3.9–10.3)
lymph#: 1.2 10*3/uL (ref 0.9–3.3)

## 2016-07-28 NOTE — Progress Notes (Signed)
Patient Care Team: [name], NP-C as PCP - General (Family Medicine)  DIAGNOSIS:  Encounter Diagnosis  Name Primary?  . 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 She does not report any new or worsening symptoms of thrombocytopenia. Denies any bruising or bleeding.  REVIEW OF SYSTEMS:   Constitutional: Denies fevers, chills or abnormal weight loss Eyes: Denies blurriness of vision Ears, nose, mouth, throat, and face: Denies mucositis or sore throat Respiratory: Denies cough, dyspnea or wheezes Cardiovascular: Denies palpitation, chest discomfort Gastrointestinal:  Denies nausea, heartburn or change in bowel habits Skin: Denies abnormal skin rashes Lymphatics: Denies new lymphadenopathy or easy bruising Neurological:Denies numbness, tingling or new weaknesses Behavioral/Psych: Mood is stable, no new changes  Extremities: No lower extremity edema All other systems were reviewed with the patient and are negative.  I have reviewed the past medical history, past surgical history, social history and family history with the patient and they are unchanged from previous note.  ALLERGIES:  has No Known Allergies.  MEDICATIONS:  Current Outpatient Prescriptions  Medication Sig Dispense Refill  . cetirizine (ZYRTEC ALLERGY) 10 MG tablet Take 1 tablet (10 mg total) by mouth daily. (Patient taking differently: Take 10 mg by mouth at bedtime. ) 90 tablet 3  . doxycycline (VIBRAMYCIN) 100 MG capsule Take 1 capsule (100 mg total) by mouth 2 (two) times daily. 20 capsule 0  . Multiple Vitamin (MULTIVITAMIN WITH MINERALS) TABS tablet Take 1 tablet by mouth daily.    . norgestrel-ethinyl estradiol (LOW-OGESTREL) 0.3-30 MG-MCG tablet Take 1 tablet by mouth daily. 84 tablet 3  . omeprazole (PRILOSEC) 40  MG capsule TAKE ONE CAPSULE BY MOUTH EVERY DAY (Patient taking differently: TAKE 83m CAPSULE BY MOUTH once daily as needed for acid reflux/heartburn) 30 capsule 1  . predniSONE (DELTASONE) 10 MG tablet Take 4 tablets (40 mg total) by mouth daily. 20 tablet 0  . PROAIR HFA 108 (90 Base) MCG/ACT inhaler INHALE 2 PUFFS INTO THE LUNGS EVERY 6 HOURS AS NEEDED FOR WHEEZING OR SHORTNESS OF BREATH 8.5 g 0  . tiotropium (SPIRIVA) 18 MCG inhalation capsule Place 1 capsule (18 mcg total) into inhaler and inhale daily. 30 capsule 6   No current facility-administered medications for this visit.     PHYSICAL EXAMINATION: ECOG PERFORMANCE STATUS: 1 - Symptomatic but completely ambulatory  Vitals:   07/28/16 1419  BP: (!) 127/52  Pulse: 76  Resp: 18  Temp: 98.8 F (37.1 C)   Filed Weights   07/28/16 1419  Weight: 134 lb 1.6 oz (60.8 kg)    GENERAL:alert, no distress and comfortable SKIN: skin color, texture, turgor are normal, no rashes or significant lesions EYES: normal, Conjunctiva are pink and non-injected, sclera clear OROPHARYNX:no exudate, no erythema and lips, buccal mucosa, and tongue normal  NECK: supple, thyroid normal size, non-tender, without nodularity LYMPH:  no palpable lymphadenopathy in the cervical, axillary or inguinal LUNGS: clear to auscultation and percussion with normal breathing effort HEART: regular rate & rhythm and no murmurs and no lower extremity edema ABDOMEN:abdomen soft, non-tender and normal bowel sounds MUSCULOSKELETAL:no cyanosis of digits and no clubbing  NEURO: alert & oriented x 3 with fluent speech, no focal motor/sensory deficits EXTREMITIES: No lower extremity edema  LABORATORY DATA:  I have reviewed the data as listed   Chemistry      Component Value  Date/Time   NA 134 (L) 07/02/2015 0001   K 4.2 07/02/2015 0001   CL 104 07/02/2015 0001   CO2 24 07/02/2015 0001   BUN 10 07/02/2015 0001   CREATININE 0.76 07/02/2015 0001      Component Value  Date/Time   CALCIUM 8.6 07/02/2015 0001   ALKPHOS 28 (L) 07/02/2015 0001   AST 19 07/02/2015 0001   ALT 15 07/02/2015 0001   BILITOT 1.0 07/02/2015 0001       Lab Results  Component Value Date   WBC 5.5 07/28/2016   HGB 12.5 07/28/2016   HCT 38.5 07/28/2016   MCV 92.0 07/28/2016   PLT 82 (L) 07/28/2016   NEUTROABS 3.7 07/28/2016    ASSESSMENT & PLAN:  Congenital thrombocytopenia (H[facility]) Congenital thrombocytopenia: Patient had previously been evaluated in [place] and subsequently in [place]. She tells me that her platelet counts ranged from 60-90,000. Her problems with bleeding were severe during menarche but have subsequently improved after being treated with birth control pills. She does not have any current problems with bruising or bleeding.  Blood work review: platelet count is 82 on 07/28/2016  Prior workup: An extensive workup by Dr. Melvenia Beam (in [place]) in the past showed no qualitative platelet defect (normal platelet aggregation testing) and no evidence of ITP. Bone marrow testing in the past has shown normal megakaryocytes, and there are no other findings on her CBC today suggest new marrow dysplasias.   Differential diagnosis for congenital thrombocytopenia as is extremely wide which include amegakaryocytic thrombocytopenia especially because of the fact that she has hearing impairment. Other X-linked thrombocytopenias are also possible as well as MYH-9 -related diseases.  I will see her back in one year for blood counts and follow-up.  I spent 25 minutes talking to the patient of which more than half was spent in counseling and coordination of care.  Orders Placed This Encounter  Procedures  . CBC with Differential    Standing Status:   Future    Standing Expiration Date:   07/28/2017   The patient has a good understanding of the overall plan. she agrees with it. she will call with any problems that may develop before the next visit here.   Rulon Eisenmenger, MD 07/28/16

## 2016-07-28 NOTE — Assessment & Plan Note (Signed)
Congenital thrombocytopenia: Patient had previously been evaluated in [place] and subsequently in [place]. She tells me that her platelet counts ranged from 60-90,000. Her problems with bleeding were severe during menarche but have subsequently improved after being treated with birth control pills. She does not have any current problems with bruising or bleeding.  Blood work review: platelet count is 98  Prior workup: An extensive workup by Dr. Melvenia Beam (in [place]) in the past showed no qualitative platelet defect (normal platelet aggregation testing) and no evidence of ITP. Bone marrow testing in the past has shown normal megakaryocytes, and there are no other findings on her CBC today suggest new marrow dysplasias.   Differential diagnosis for congenital thrombocytopenia as is extremely wide which include amegakaryocytic thrombocytopenia especially because of the fact that she has hearing impairment. Other X-linked thrombocytopenias are also possible as well as MYH-9 -related diseases.  I will see her back in one year for blood counts and follow-up.

## 2016-08-03 ENCOUNTER — Other Ambulatory Visit: Payer: Self-pay | Admitting: Obstetrics and Gynecology

## 2016-08-03 ENCOUNTER — Other Ambulatory Visit: Payer: Self-pay | Admitting: Pulmonary Disease

## 2016-08-03 NOTE — Telephone Encounter (Signed)
Medication refill request: low ogestrel  Last AEX:  07/16/16 JJ Next AEX: 08/10/17 SM  Last MMG (if hormonal medication request): none Refill authorized: 07/16/16 #[id number] with 3 refills to [facility] [address]

## 2016-09-23 ENCOUNTER — Encounter: Payer: Self-pay | Admitting: Family Medicine

## 2016-09-23 ENCOUNTER — Ambulatory Visit (INDEPENDENT_AMBULATORY_CARE_PROVIDER_SITE_OTHER): Payer: BLUE CROSS/BLUE SHIELD | Admitting: Family Medicine

## 2016-09-23 VITALS — BP 110/64 | HR 76 | Wt 127.8 lb

## 2016-09-23 DIAGNOSIS — Z820 Family history of epilepsy and other diseases of the nervous system: Secondary | ICD-10-CM | POA: Diagnosis not present

## 2016-09-23 DIAGNOSIS — Z8279 Family history of other congenital malformations, deformations and chromosomal abnormalities: Secondary | ICD-10-CM | POA: Insufficient documentation

## 2016-09-23 DIAGNOSIS — M79643 Pain in unspecified hand: Secondary | ICD-10-CM | POA: Diagnosis not present

## 2016-09-23 NOTE — Progress Notes (Signed)
   Subjective:    Patient ID: [place], female    DOB: 1995/12/24, 21 y.o.   MRN: [id number]  HPI Chief Complaint  Patient presents with  . dermatology    dermatology- mom wants pt to be checked for tuberous scleriosis and needs a dermatology   She is here with her mother with a request for dermatology referral. Mother states she was told by her neurologist that the patient should be seen and evaluated by dermatology due to mother's recent diagnosis of tuberous sclerosis. Mother states patient has a several year history of bilateral hand pain that has been deemed no musculoskeletal.  No other joint involvement. States she is not having hand pain today. No redness, swelling or tenderness.   Denies fever, chills, chest pain, palpitations, shortness of breath, cough, abdominal pain, nausea, vomiting, diarrhea.    Review of Systems Pertinent positives and negatives in the history of present illness.     Objective:   Physical Exam  Constitutional: She is oriented to person, place, and time. She appears well-developed and well-nourished. No distress.  Eyes: Conjunctivae are normal.  Neck: Neck supple.  Neurological: She is alert and oriented to person, place, and time.  Skin: Skin is warm and dry. No rash noted. No erythema. No pallor.  Psychiatric: She has a normal mood and affect. Her behavior is normal.   BP 110/64   Pulse 76   Wt 127 lb 12.8 oz (58 kg)   BMI 21.43 kg/m       Assessment & Plan:  Hand pain, not arthralgia, unspecified laterality - Plan: Ambulatory referral to Dermatology  Family history of tuberous sclerosis - Plan: Ambulatory referral to Dermatology  She is asymptomatic today.  Will refer her to dermatology as requested.

## 2016-10-08 ENCOUNTER — Telehealth: Payer: Self-pay | Admitting: Family Medicine

## 2016-10-08 MED ORDER — TIOTROPIUM BROMIDE MONOHYDRATE 18 MCG IN CAPS: ORAL_CAPSULE | RESPIRATORY_TRACT | 0 refills | Status: DC

## 2016-10-08 NOTE — Telephone Encounter (Signed)
Ok to refill 

## 2016-10-08 NOTE — Telephone Encounter (Signed)
done

## 2016-10-08 NOTE — Telephone Encounter (Signed)
Pt called for refill of Spiriva. She is completely out. Pt uses [facility] in [place] and can be reached at [phone].

## 2016-10-13 ENCOUNTER — Telehealth: Payer: Self-pay

## 2016-10-13 MED ORDER — TIOTROPIUM BROMIDE MONOHYDRATE 18 MCG IN CAPS: ORAL_CAPSULE | RESPIRATORY_TRACT | 0 refills | Status: DC

## 2016-10-13 NOTE — Telephone Encounter (Signed)
Called pt and she wanted it called to [facility] [address]. Resent script. RLB

## 2016-10-13 NOTE — Telephone Encounter (Signed)
Pt needs refill of Sprivia called to [facility]. Pt wants a callback when it is filled. Victor[place] December

## 2016-10-13 NOTE — Telephone Encounter (Signed)
This was already done on 10/08/16. Look at previous telephone call

## 2016-11-12 ENCOUNTER — Ambulatory Visit (INDEPENDENT_AMBULATORY_CARE_PROVIDER_SITE_OTHER): Payer: BLUE CROSS/BLUE SHIELD

## 2016-11-12 VITALS — Ht 64.75 in | Wt 130.0 lb

## 2016-11-12 DIAGNOSIS — Z23 Encounter for immunization: Secondary | ICD-10-CM

## 2016-11-12 NOTE — Progress Notes (Addendum)
Pt came in today for 3rd gardasil. Pt states she wasn't sure why she was here but declined to get her 3rd gardasil vaccination. I explained to patient the importance of getting her 3rd shot to complete her series but patient still declined. Before patient left she let the lady who came with her know what todays visit was for & that she didn't want to do It, so they left. Note sent to Dr Talbert Nan.

## 2016-11-17 ENCOUNTER — Other Ambulatory Visit: Payer: Self-pay | Admitting: Family Medicine

## 2016-11-18 NOTE — Telephone Encounter (Signed)
Is this okay to refill>? 

## 2016-11-18 NOTE — Telephone Encounter (Signed)
Her pulmonologist prescribed this for her but it is ok to refill.

## 2016-11-19 ENCOUNTER — Other Ambulatory Visit: Payer: Self-pay | Admitting: Family Medicine

## 2016-11-19 DIAGNOSIS — J984 Other disorders of lung: Secondary | ICD-10-CM

## 2017-05-14 ENCOUNTER — Telehealth: Payer: Self-pay

## 2017-05-14 NOTE — Telephone Encounter (Signed)
Spoke with patient mother concerning upcomming appointment, mail letter. Per 2/8 rescheduling

## 2017-05-21 ENCOUNTER — Other Ambulatory Visit: Payer: Self-pay

## 2017-05-21 ENCOUNTER — Ambulatory Visit (HOSPITAL_COMMUNITY)
Admission: EM | Admit: 2017-05-21 | Discharge: 2017-05-21 | Disposition: A | Discharge status: Home / Self Care | Payer: Medicaid Other | Attending: Family Medicine | Admitting: Family Medicine

## 2017-05-21 ENCOUNTER — Encounter (HOSPITAL_COMMUNITY): Payer: Self-pay | Admitting: Emergency Medicine

## 2017-05-21 DIAGNOSIS — J0100 Acute maxillary sinusitis, unspecified: Secondary | ICD-10-CM | POA: Diagnosis not present

## 2017-05-21 MED ORDER — AMOXICILLIN 875 MG PO TABS: 875.0000 mg | ORAL_TABLET | Freq: Two times a day (BID) | ORAL | 0 refills | Status: DC

## 2017-05-21 NOTE — ED Triage Notes (Signed)
Pt c/o sinus infection x2, congestion, facial pain. Pt requesting medication for it.

## 2017-05-21 NOTE — ED Provider Notes (Signed)
[facility]   [id number] 05/21/17 Arrival Time: 73   SUBJECTIVE:  [place] is a 22 y.o. female who presents to the urgent care with complaint of sinus infection x2, congestion, facial pain.  Patient has been diagnosed with idiopathic COPD.  She has a cough.  Is using her inhalers.   Past Medical History:  Diagnosis Date  . Abnormal uterine bleeding   . Blood transfusion without reported diagnosis   . Bronchopulmonary dysplasia   . Chronic lung disease   . 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 COPD (chronic obstructive pulmonary disease) ([place])   . Emphysema of lung ([place])   . History of seizures   . Meconium aspiration   . Prediabetes   . Thrombocytopenia ([place])    Family History  Problem Relation Age of Onset  . Osteopenia Mother   . Hypertension Mother   . Diabetes Mother   . Breast cancer Maternal Aunt   . Cancer Maternal Aunt   . Hyperlipidemia Maternal Aunt   . Diabetes Maternal Aunt   . Lung cancer Maternal Grandmother   . Hyperlipidemia Maternal Grandmother   . Diabetes Maternal Grandmother   . COPD Maternal Grandmother   . Emphysema Maternal Grandmother   . Sickle cell anemia Maternal Grandmother   . Hyperlipidemia Maternal Grandfather   . Diabetes Maternal Grandfather   . Hypertension Father   . Hyperlipidemia Father   . Sickle cell anemia Maternal Uncle   . Rheum arthritis Cousin    Social History   Socioeconomic History  . Marital status: Single    Spouse name: Not on file  . Number of children: Not on file  . Years of education: Not on file  . Highest education level: Not on file  Social Needs  . Financial resource strain: Not on file  . Food insecurity - worry: Not on file  . Food insecurity - inability: Not on file  . Transportation needs - medical: Not on file  . Transportation needs - non-medical: Not on file  Occupational History  . Not on file  Tobacco Use  . Smoking status: Never Smoker  . Smokeless tobacco: Never Used    Substance and Sexual Activity  . Alcohol use: No    Alcohol/week: 0.0 oz  . Drug use: No  . Sexual activity: No    Birth control/protection: Pill  Other Topics Concern  . Not on file  Social History Narrative   Originally from [place]. Moved to [place] from [facility] in 2016. Has also lived in [place]. No pets currently. No bird or hot tub exposure. Remotely had problems with mold in an apartment from [place] until 2007 in a bathroom. Does have carpet. Enjoys playing on her phone.    No outpatient medications have been marked as taking for the 05/21/17 encounter [facility] Encounter).   No Known Allergies    ROS: As per HPI, remainder of ROS negative.   OBJECTIVE:   Vitals:   05/21/17 1343  BP: (!) 129/52  Pulse: 92  Resp: 16  Temp: 98.6 F (37 C)  SpO2: 100%     General appearance: alert; no distress Eyes: PERRL; EOMI; conjunctiva normal HENT: normocephalic; atraumatic; TMs normal, canal normal, external ears normal without trauma; nasal mucosa shows two small abrasions on lateral mucosa; oral mucosa normal Neck: supple Lungs: clear to auscultation bilaterally Heart: regular rate and rhythm Back: no CVA tenderness Extremities: no cyanosis or edema; symmetrical with no gross deformities Skin: warm and dry Neurologic: normal gait; grossly normal  Psychological: alert and cooperative; normal mood and affect      Labs:  Results for orders placed or performed in visit on 07/28/16  CBC with Differential  Result Value Ref Range   WBC 5.5 3.9 - 10.3 10e3/uL   NEUT# 3.7 1.5 - 6.5 10e3/uL   HGB 12.5 11.6 - 15.9 g/dL   HCT 38.5 34.8 - 46.6 %   Platelets 82 (L) 145 - 400 10e3/uL   MCV 92.0 79.5 - 101.0 fL   MCH 29.9 25.1 - 34.0 pg   MCHC 32.5 31.5 - 36.0 g/dL   RBC 4.18 3.70 - 5.45 10e6/uL   RDW 13.6 11.2 - 14.5 %   lymph# 1.2 0.9 - 3.3 10e3/uL   MONO# 0.5 0.1 - 0.9 10e3/uL   Eosinophils Absolute 0.0 0.0 - 0.5 10e3/uL   Basophils Absolute 0.0 0.0 - 0.1 10e3/uL   NEUT% 68.0 38.4 - 76.8  %   LYMPH% 21.6 14.0 - 49.7 %   MONO% 9.3 0.0 - 14.0 %   EOS% 0.4 0.0 - 7.0 %   BASO% 0.7 0.0 - 2.0 %    Labs Reviewed - No data to display  No results found.     ASSESSMENT & PLAN:  1. Acute maxillary sinusitis, recurrence not specified     Meds ordered this encounter  Medications  . amoxicillin (AMOXIL) 875 MG tablet    Sig: Take 1 tablet (875 mg total) by mouth 2 (two) times daily.    Dispense:  20 tablet    Refill:  0    Reviewed expectations re: course of current medical issues. Questions answered. Outlined signs and symptoms indicating need for more acute intervention. Patient verbalized understanding. After Visit Summary given.    Procedures:      Robyn Haber, MD 05/21/17 [phone]

## 2017-05-25 ENCOUNTER — Other Ambulatory Visit: Payer: Self-pay | Admitting: Obstetrics & Gynecology

## 2017-05-26 NOTE — Telephone Encounter (Signed)
Medication refill request: OCP  Last AEX:  07-16-16  Next AEX: 08-10-17  Last MMG (if hormonal medication request): N/A Refill authorized: please advise

## 2017-06-22 ENCOUNTER — Other Ambulatory Visit: Payer: Self-pay | Admitting: Family Medicine

## 2017-06-23 ENCOUNTER — Telehealth: Payer: Self-pay | Admitting: Family Medicine

## 2017-06-23 MED ORDER — TIOTROPIUM BROMIDE MONOHYDRATE 18 MCG IN CAPS: ORAL_CAPSULE | RESPIRATORY_TRACT | 0 refills | Status: DC

## 2017-06-23 NOTE — Telephone Encounter (Signed)
Left message for pt to call me back and schedule an appt. Once appt is scheduled then I can refill med

## 2017-06-23 NOTE — Telephone Encounter (Signed)
done

## 2017-06-23 NOTE — Telephone Encounter (Signed)
Pt called and made a cpe appt for march the 28th if you could send her Spiriva to the [facility], [address] AT [address] pt can be reached at [phone]

## 2017-07-01 ENCOUNTER — Ambulatory Visit: Payer: Medicaid Other | Admitting: Family Medicine

## 2017-07-01 ENCOUNTER — Encounter: Payer: Self-pay | Admitting: Family Medicine

## 2017-07-01 VITALS — BP 120/80 | HR 60 | Ht 64.5 in | Wt 129.0 lb

## 2017-07-01 DIAGNOSIS — Z8774 Personal history of (corrected) congenital malformations of heart and circulatory system: Secondary | ICD-10-CM | POA: Diagnosis not present

## 2017-07-01 DIAGNOSIS — R7303 Prediabetes: Secondary | ICD-10-CM

## 2017-07-01 DIAGNOSIS — R9431 Abnormal electrocardiogram [ECG] [EKG]: Secondary | ICD-10-CM | POA: Diagnosis not present

## 2017-07-01 DIAGNOSIS — R0989 Other specified symptoms and signs involving the circulatory and respiratory systems: Secondary | ICD-10-CM

## 2017-07-01 DIAGNOSIS — Z0000 Encounter for general adult medical examination without abnormal findings: Secondary | ICD-10-CM | POA: Diagnosis not present

## 2017-07-01 DIAGNOSIS — J439 Emphysema, unspecified: Secondary | ICD-10-CM | POA: Diagnosis not present

## 2017-07-01 DIAGNOSIS — K219 Gastro-esophageal reflux disease without esophagitis: Secondary | ICD-10-CM

## 2017-07-01 LAB — POCT URINALYSIS DIP (PROADVANTAGE DEVICE)
BILIRUBIN UA: NEGATIVE
GLUCOSE UA: NEGATIVE mg/dL
Ketones, POC UA: NEGATIVE mg/dL
Leukocytes, UA: NEGATIVE
Nitrite, UA: NEGATIVE
Protein Ur, POC: NEGATIVE mg/dL
RBC UA: NEGATIVE
SPECIFIC GRAVITY, URINE: 1.03
Urobilinogen, Ur: NEGATIVE
pH, UA: 6 (ref 5.0–8.0)

## 2017-07-01 NOTE — Patient Instructions (Addendum)
It appears that you still need your third HPV vaccine. Check with your mother about this and if you would like to get this you can return here or talk to your gynecologist about this at your upcoming appointment.   I am referring you to a cardiologist for further evaluation of an abnormal heart rhythm today and abnormal EKG.  They will call you to schedule an appointment.   We will call you with lab results.     Preventative Care for Adults - Female      MAINTAIN REGULAR HEALTH EXAMS:  A routine yearly physical is a good way to check in with your primary care provider about your health and preventive screening. It is also an opportunity to share updates about your health and any concerns you have, and receive a thorough all-over exam.   Most health insurance companies pay for at least some preventative services.  Check with your health plan for specific coverages.  WHAT PREVENTATIVE SERVICES DO WOMEN NEED?  Adult women should have their weight and blood pressure checked regularly.   Women age 70 and older should have their cholesterol levels checked regularly.  Women should be screened for cervical cancer with a Pap smear and pelvic exam beginning at age 67.  Breast cancer screening generally begins at age 76 with a mammogram and breast exam by your primary care provider.    Beginning at age 67 and continuing to age 56, women should be screened for colorectal cancer.  Certain people may need continued testing until age 31.  Updating vaccinations is part of preventative care.  Vaccinations help protect against diseases such as the flu.  Osteoporosis is a disease in which the bones lose minerals and strength as we age. Women ages 104 and over should discuss this with their caregivers, as should women after menopause who have other risk factors.  Lab tests are generally done as part of preventative care to screen for anemia and blood disorders, to screen for problems with the kidneys and  liver, to screen for bladder problems, to check blood sugar, and to check your cholesterol level.  Preventative services generally include counseling about diet, exercise, avoiding tobacco, drugs, excessive alcohol consumption, and sexually transmitted infections.    GENERAL RECOMMENDATIONS FOR GOOD HEALTH:  Healthy diet:  Eat a variety of foods, including fruit, vegetables, animal or vegetable protein, such as meat, fish, chicken, and eggs, or beans, lentils, tofu, and grains, such as rice.  Drink plenty of water daily.  Decrease saturated fat in the diet, avoid lots of red meat, processed foods, sweets, fast foods, and fried foods.  Exercise:  Aerobic exercise helps maintain good heart health. At least 30-40 minutes of moderate-intensity exercise is recommended. For example, a brisk walk that increases your heart rate and breathing. This should be done on most days of the week.   Find a type of exercise or a variety of exercises that you enjoy so that it becomes a part of your daily life.  Examples are running, walking, swimming, water aerobics, and biking.  For motivation and support, explore group exercise such as aerobic class, spin class, Zumba, Yoga,or  martial arts, etc.    Set exercise goals for yourself, such as a certain weight goal, walk or run in a race such as a 5k walk/run.  Speak to your primary care provider about exercise goals.  Disease prevention:  If you smoke or chew tobacco, find out from your caregiver how to quit. It can literally  save your life, no matter how long you have been a tobacco user. If you do not use tobacco, never begin.   Maintain a healthy diet and normal weight. Increased weight leads to problems with blood pressure and diabetes.   The Body Mass Index or BMI is a way of measuring how much of your body is fat. Having a BMI above 27 increases the risk of heart disease, diabetes, hypertension, stroke and other problems related to obesity. Your  caregiver can help determine your BMI and based on it develop an exercise and dietary program to help you achieve or maintain this important measurement at a healthful level.  High blood pressure causes heart and blood vessel problems.  Persistent high blood pressure should be treated with medicine if weight loss and exercise do not work.   Fat and cholesterol leaves deposits in your arteries that can block them. This causes heart disease and vessel disease elsewhere in your body.  If your cholesterol is found to be high, or if you have heart disease or certain other medical conditions, then you may need to have your cholesterol monitored frequently and be treated with medication.   Ask if you should have a cardiac stress test if your history suggests this. A stress test is a test done on a treadmill that looks for heart disease. This test can find disease prior to there being a problem.  Menopause can be associated with physical symptoms and risks. Hormone replacement therapy is available to decrease these. You should talk to your caregiver about whether starting or continuing to take hormones is right for you.   Osteoporosis is a disease in which the bones lose minerals and strength as we age. This can result in serious bone fractures. Risk of osteoporosis can be identified using a bone density scan. Women ages 13 and over should discuss this with their caregivers, as should women after menopause who have other risk factors. Ask your caregiver whether you should be taking a calcium supplement and Vitamin D, to reduce the rate of osteoporosis.   Avoid drinking alcohol in excess (more than two drinks per day).  Avoid use of street drugs. Do not share needles with anyone. Ask for professional help if you need assistance or instructions on stopping the use of alcohol, cigarettes, and/or drugs.  Brush your teeth twice a day with fluoride toothpaste, and floss once a day. Good oral hygiene prevents tooth  decay and gum disease. The problems can be painful, unattractive, and can cause other health problems. Visit your dentist for a routine oral and dental check up and preventive care every 6-12 months.   Look at your skin regularly.  Use a mirror to look at your back. Notify your caregivers of changes in moles, especially if there are changes in shapes, colors, a size larger than a pencil eraser, an irregular border, or development of new moles.  Safety:  Use seatbelts 100% of the time, whether driving or as a passenger.  Use safety devices such as hearing protection if you work in environments with loud noise or significant background noise.  Use safety glasses when doing any work that could send debris in to the eyes.  Use a helmet if you ride a bike or motorcycle.  Use appropriate safety gear for contact sports.  Talk to your caregiver about gun safety.  Use sunscreen with a SPF (or skin protection factor) of 15 or greater.  Lighter skinned people are at a greater risk of skin  cancer. Don't forget to also wear sunglasses in order to protect your eyes from too much damaging sunlight. Damaging sunlight can accelerate cataract formation.   Practice safe sex. Use condoms. Condoms are used for birth control and to help reduce the spread of sexually transmitted infections (or STIs).  Some of the STIs are gonorrhea (the clap), chlamydia, syphilis, trichomonas, herpes, HPV (human papilloma virus) and HIV (human immunodeficiency virus) which causes AIDS. The herpes, HIV and HPV are viral illnesses that have no cure. These can result in disability, cancer and death.   Keep carbon monoxide and smoke detectors in your home functioning at all times. Change the batteries every 6 months or use a model that plugs into the wall.   Vaccinations:  Stay up to date with your tetanus shots and other required immunizations. You should have a booster for tetanus every 10 years. Be sure to get your flu shot every year,  since 5%-20% of the [place] population comes down with the flu. The flu vaccine changes each year, so being vaccinated once is not enough. Get your shot in the fall, before the flu season peaks.   Other vaccines to consider:  Human Papilloma Virus or HPV causes cancer of the cervix, and other infections that can be transmitted from person to person. There is a vaccine for HPV, and females should get immunized between the ages of 74 and 30. It requires a series of 3 shots.   Pneumococcal vaccine to protect against certain types of pneumonia.  This is normally recommended for adults age 84 or older.  However, adults younger than 24 years old with certain underlying conditions such as diabetes, heart or lung disease should also receive the vaccine.  Shingles vaccine to protect against Varicella Zoster if you are older than age 61, or younger than 25 years old with certain underlying illness.  Hepatitis A vaccine to protect against a form of infection of the liver by a virus acquired from food.  Hepatitis B vaccine to protect against a form of infection of the liver by a virus acquired from blood or body fluids, particularly if you work in health care.  If you plan to travel internationally, check with your local health department for specific vaccination recommendations.  Cancer Screening:  Breast cancer screening is essential to preventive care for women. All women age 66 and older should perform a breast self-exam every month. At age 48 and older, women should have their caregiver complete a breast exam each year. Women at ages 54 and older should have a mammogram (x-ray film) of the breasts. Your caregiver can discuss how often you need mammograms.    Cervical cancer screening includes taking a Pap smear (sample of cells examined under a microscope) from the cervix (end of the uterus). It also includes testing for HPV (Human Papilloma Virus, which can cause cervical cancer). Screening and a pelvic  exam should begin at age 68, or 3 years after a woman becomes sexually active. Screening should occur every year, with a Pap smear but no HPV testing, up to age 102. After age 38, you should have a Pap smear every 3 years with HPV testing, if no HPV was found previously.   Most routine colon cancer screening begins at the age of 56. On a yearly basis, doctors may provide special easy to use take-home tests to check for hidden blood in the stool. Sigmoidoscopy or colonoscopy can detect the earliest forms of colon cancer and is life saving. These  tests use a small camera at the end of a tube to directly examine the colon. Speak to your caregiver about this at age 71, when routine screening begins (and is repeated every 5 years unless early forms of pre-cancerous polyps or small growths are found).

## 2017-07-01 NOTE — Progress Notes (Signed)
Subjective:    Patient ID: Tammie Wade, female    DOB: [birth year], 22 y.o.   MRN: [id number]  HPI Chief Complaint  Patient presents with  . cpe    cpe had juice this morning   She is here for a complete physical exam. She is alone today. States this is the first appointment where her mother has not been with her. She [place] the [employer] bus.   States lung disease is well controlled and no recent flare ups. She is followed by her pulmonologist. Using daily Spiriva. Has not needed rescue inhaler lately.  GERD- is not needing omeprazole. No issues with this.  Thrombocytopenia is being monitored by Dr. Lindi Adie.  She has an OB/GYN and plans to see her for breast and pelvic exam.  History of prediabetes.  History of VSD repair.   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   Denies smoking, drinking alcohol, drug use  Diet: fairly healthy  Excerise: active at work. In groups where she Is very active   Immunizations: appears to need her 3rd HPV vaccine.   Health maintenance:  Last Menstrual cycle: started 4 days ago  Last gynecological exam: upcoming appointment  She is taking OCPs.  Last Dental Exam: one year ago  Last Eye Exam: in the past 2 months   States she feels safe in her environment.   Reviewed allergies, medications, past medical, surgical, family, and social history.   Review of Systems Review of Systems Constitutional: -fever, -chills, -sweats, -unexpected weight change,-fatigue ENT: -runny nose, -ear pain, -sore throat Cardiology:  -chest pain, -palpitations, -edema Respiratory: -cough, -shortness of breath, -wheezing Gastroenterology: -abdominal pain, -nausea, -vomiting, -diarrhea, -constipation  Hematology: -bleeding or bruising problems Musculoskeletal: -arthralgias, -myalgias, -joint swelling, -back pain Ophthalmology: -vision changes Urology: -dysuria, -difficulty urinating, -hematuria, -urinary  frequency, -urgency Neurology: -headache, -weakness, -tingling, -numbness       Objective:   Physical Exam BP 120/80   Pulse 60   Ht 5' 4.5" (1.638 m)   Wt 129 lb (58.5 kg)   LMP 06/27/2017   BMI 21.80 kg/m   General Appearance:    Alert, cooperative, no distress, appears younger than stated age  Head:    Normocephalic, without obvious abnormality, atraumatic  Eyes:    PERRL, conjunctiva/corneas clear, EOM's intact, fundi    benign  Ears:    Normal TM's and external ear canals  Nose:   Nares normal, mucosa normal, no drainage or sinus   tenderness  Throat:   Lips, mucosa, and tongue normal; teeth and gums normal  Neck:   Supple, no lymphadenopathy;  thyroid:  no   enlargement/tenderness/nodules; no carotid   bruit or JVD  Back:    Spine nontender, no curvature, ROM normal, no CVA     tenderness  Lungs:     Clear to auscultation bilaterally without wheezes, rales or     ronchi; respirations unlabored  Chest Wall:    No tenderness or deformity   Heart:    Regular rate with an irregular rhythm, S1 and S2 normal, no murmur, rub   or gallop  Breast Exam:    OB/GYN  Abdomen:     Soft, non-tender, nondistended, normoactive bowel sounds,    no masses, no hepatosplenomegaly  Genitalia:    OB/GYN  Rectal:    Not performed due to age<40 and no related complaints  Extremities:   No clubbing, cyanosis or edema  Pulses:  2+ and symmetric all extremities  Skin:   Skin color, texture, turgor normal, no rashes or lesions  Lymph nodes:   Cervical, supraclavicular, and axillary nodes normal  Neurologic:   CNII-XII intact, normal strength, sensation and gait; reflexes 2+ and symmetric throughout          Psych:   Normal mood, affect, hygiene and grooming.     Urinalysis dipstick: negative       Assessment & Plan:  Routine general medical examination at a health care facility - Plan: POCT Urinalysis DIP (Proadvantage Device), CBC with Differential/Platelet, Comprehensive metabolic panel,  TSH  Pulmonary emphysema, unspecified emphysema type (HCC)  Gastroesophageal reflux disease, esophagitis presence not specified  Prediabetes - Plan: Hemoglobin A1c  Pulse irregularity - Plan: EKG 12-Lead, Ambulatory referral to Cardiology  History of percutaneous transcatheter closure of congenital VSD - Plan: EKG 12-Lead, Ambulatory referral to Cardiology  Abnormal ECG - Plan: Ambulatory referral to Cardiology  ECG- sinus bradycardia with sinus arrhythmia. Rate 58, p wave abnormality.  History of VSD repair. She had a cardiologist in [facility] prior to moving to [place].  Referral to cardiology.  GERD is well controlled with diet and she is not taking PPI.  No recent respiratory flares. she will continue medication and see her pulmonologist  Discussed vaccine deficiencies and she will discuss with her mother. She may follow up here or with her OB/GYN.  Discussed safety and health promotion.  Follow up pending labs

## 2017-07-02 LAB — COMPREHENSIVE METABOLIC PANEL
ALBUMIN: 4.6 g/dL (ref 3.5–5.5)
ALK PHOS: 28 IU/L — AB (ref 39–117)
ALT: 17 IU/L (ref 0–32)
AST: 14 IU/L (ref 0–40)
Albumin/Globulin Ratio: 1.5 (ref 1.2–2.2)
BILIRUBIN TOTAL: 1.1 mg/dL (ref 0.0–1.2)
BUN / CREAT RATIO: 14 (ref 9–23)
BUN: 10 mg/dL (ref 6–20)
CHLORIDE: 104 mmol/L (ref 96–106)
CO2: 20 mmol/L (ref 20–29)
Calcium: 9.3 mg/dL (ref 8.7–10.2)
Creatinine, Ser: 0.72 mg/dL (ref 0.57–1.00)
GFR calc Af Amer: 137 mL/min/{1.73_m2} (ref >59)
GFR calc non Af Amer: 118 mL/min/{1.73_m2} (ref >59)
GLOBULIN, TOTAL: 3 g/dL (ref 1.5–4.5)
GLUCOSE: 70 mg/dL (ref 65–99)
Potassium: 4.1 mmol/L (ref 3.5–5.2)
Sodium: 142 mmol/L (ref 134–144)
Total Protein: 7.6 g/dL (ref 6.0–8.5)

## 2017-07-02 LAB — CBC WITH DIFFERENTIAL/PLATELET
Basophils Absolute: 0 10*3/uL (ref 0.0–0.2)
Basos: 0 %
EOS (ABSOLUTE): 0 10*3/uL (ref 0.0–0.4)
EOS: 0 %
HEMATOCRIT: 39 % (ref 34.0–46.6)
HEMOGLOBIN: 12.4 g/dL (ref 11.1–15.9)
Immature Grans (Abs): 0 10*3/uL (ref 0.0–0.1)
Immature Granulocytes: 0 %
Lymphocytes Absolute: 1.3 10*3/uL (ref 0.7–3.1)
Lymphs: 30 %
MCH: 30.1 pg (ref 26.6–33.0)
MCHC: 31.8 g/dL (ref 31.5–35.7)
MCV: 95 fL (ref 79–97)
MONOS ABS: 0.5 10*3/uL (ref 0.1–0.9)
Monocytes: 12 %
Neutrophils Absolute: 2.4 10*3/uL (ref 1.4–7.0)
Neutrophils: 58 %
Platelets: 79 10*3/uL — CL (ref 150–379)
RBC: 4.12 x10E6/uL (ref 3.77–5.28)
RDW: 13.2 % (ref 12.3–15.4)
WBC: 4.2 10*3/uL (ref 3.4–10.8)

## 2017-07-02 LAB — TSH: TSH: 1.43 u[IU]/mL (ref 0.450–4.500)

## 2017-07-02 LAB — HEMOGLOBIN A1C
Est. average glucose Bld gHb Est-mCnc: 114 mg/dL
Hgb A1c MFr Bld: 5.6 % (ref 4.8–5.6)

## 2017-07-21 ENCOUNTER — Ambulatory Visit: Payer: BLUE CROSS/BLUE SHIELD | Admitting: Obstetrics and Gynecology

## 2017-07-22 ENCOUNTER — Telehealth: Payer: Self-pay | Admitting: Medical Oncology

## 2017-07-22 NOTE — Telephone Encounter (Signed)
Confirmed appt.

## 2017-07-27 ENCOUNTER — Other Ambulatory Visit: Payer: BLUE CROSS/BLUE SHIELD

## 2017-07-27 ENCOUNTER — Ambulatory Visit: Payer: BLUE CROSS/BLUE SHIELD | Admitting: Hematology and Oncology

## 2017-08-03 ENCOUNTER — Other Ambulatory Visit: Payer: Self-pay | Admitting: Family Medicine

## 2017-08-04 ENCOUNTER — Telehealth: Payer: Self-pay

## 2017-08-04 ENCOUNTER — Other Ambulatory Visit: Payer: Medicaid Other

## 2017-08-04 NOTE — Telephone Encounter (Signed)
Patient called to reschedule for a later time tomorrow. Dr. Lindi Adie agreed to see pt at 230p. Rescheduled pt. She is aware of new appt times.  Cyndia Bent RN

## 2017-08-05 ENCOUNTER — Inpatient Hospital Stay: Payer: Medicaid Other | Attending: Hematology and Oncology | Admitting: Hematology and Oncology

## 2017-08-05 ENCOUNTER — Ambulatory Visit: Payer: BLUE CROSS/BLUE SHIELD | Admitting: Hematology and Oncology

## 2017-08-05 ENCOUNTER — Other Ambulatory Visit: Payer: BLUE CROSS/BLUE SHIELD

## 2017-08-05 DIAGNOSIS — Z79899 Other long term (current) drug therapy: Secondary | ICD-10-CM | POA: Diagnosis not present

## 2017-08-05 DIAGNOSIS — D6942 Congenital and hereditary thrombocytopenia purpura: Secondary | ICD-10-CM | POA: Insufficient documentation

## 2017-08-05 NOTE — Progress Notes (Signed)
Patient Care Team: [name], NP-C as PCP - General (Family Medicine)  DIAGNOSIS:  Encounter Diagnosis  Name Primary?  . 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  Her platelet counts normally range between 60-90,000.  Last year her platelet count was 82,000.  Platelet count 07/01/2017: 79 she is here today for routine follow-up and reports no new symptoms or concerns.  She is working full-time at [employer].  REVIEW OF SYSTEMS:   Constitutional: Denies fevers, chills or abnormal weight loss Eyes: Denies blurriness of vision Ears, nose, mouth, throat, and face: Denies mucositis or sore throat Respiratory: Denies cough, dyspnea or wheezes Cardiovascular: Denies palpitation, chest discomfort Gastrointestinal:  Denies nausea, heartburn or change in bowel habits Skin: Denies abnormal skin rashes Lymphatics: Denies new lymphadenopathy or easy bruising Neurological:Denies numbness, tingling or new weaknesses Behavioral/Psych: Mood is stable, no new changes  Extremities: No lower extremity edema  All other systems were reviewed with the patient and are negative.  I have reviewed the past medical history, past surgical history, social history and family history with the patient and they are unchanged from previous note.  ALLERGIES:  has No Known Allergies.  MEDICATIONS:  Current Outpatient Medications  Medication Sig Dispense Refill  . cetirizine (ZYRTEC ALLERGY) 10 MG tablet Take 1 tablet (10 mg total) by mouth daily. (Patient taking differently: Take 10 mg by mouth at bedtime. ) 90 tablet 3  . LOW-OGESTREL 0.3-30 MG-MCG tablet TAKE 1 TABLET BY MOUTH DAILY 84 tablet 0  . Multiple Vitamin (MULTIVITAMIN WITH MINERALS) TABS tablet Take 1 tablet by mouth daily.    Marland Kitchen omeprazole  (PRILOSEC) 40 MG capsule TAKE ONE CAPSULE BY MOUTH EVERY DAY (Patient taking differently: TAKE 43m CAPSULE BY MOUTH once daily as needed for acid reflux/heartburn) 30 capsule 1  . PROAIR HFA 108 (90 Base) MCG/ACT inhaler INHALE 2 PUFFS INTO THE LUNGS EVERY 6 HOURS AS NEEDED FOR WHEEZING OR SHORTNESS OF BREATH 8.5 g 0  . SPIRIVA HANDIHALER 18 MCG inhalation capsule PLACE ONE CAPSULE INTO INHALER AND INHALE DAILY 30 capsule 2  . tiotropium (SPIRIVA HANDIHALER) 18 MCG inhalation capsule INHALE CONTENTS OF 1 CAPSULE ONCE DAILY USING HANDIHALER 30 capsule 1  . tretinoin (RETIN-A) 0.025 % cream Apply topically at bedtime.     No current facility-administered medications for this visit.     PHYSICAL EXAMINATION: ECOG PERFORMANCE STATUS: 1 - Symptomatic but completely ambulatory  Vitals:   08/05/17 1406  BP: (!) 119/57  Pulse: 86  Resp: 18  Temp: 97.6 F (36.4 C)  SpO2: 100%   Filed Weights   08/05/17 1406  Weight: 132 lb (59.9 kg)    GENERAL:alert, no distress and comfortable SKIN: skin color, texture, turgor are normal, no rashes or significant lesions EYES: normal, Conjunctiva are pink and non-injected, sclera clear OROPHARYNX:no exudate, no erythema and lips, buccal mucosa, and tongue normal  NECK: supple, thyroid normal size, non-tender, without nodularity LYMPH:  no palpable lymphadenopathy in the cervical, axillary or inguinal LUNGS: clear to auscultation and percussion with normal breathing effort HEART: regular rate & rhythm and no murmurs and no lower extremity edema ABDOMEN:abdomen soft, non-tender and normal bowel sounds MUSCULOSKELETAL:no cyanosis of digits and no clubbing  NEURO: alert & oriented x 3 with fluent speech, no focal motor/sensory deficits EXTREMITIES: No lower extremity edema  LABORATORY  DATA:  I have reviewed the data as listed CMP Latest Ref Rng & Units 07/01/2017 07/02/2015 02/14/2015  Glucose 65 - 99 mg/dL 70 76 92  BUN 6 - 20 mg/dL _0 Creatinine 0.57 - 1.00 mg/dL 0.72 0.76 0.79  Sodium 134 - 144 mmol/L 142 134(L) 135  Potassium 3.5 - 5.2 mmol/L 4.1 4.2 4.0  Chloride 96 - 106 mmol/L 104 104 102  CO2 20 - 29 mmol/L _1 Calcium 8.7 - 10.2 mg/dL 9.3 8.6 9.5  Total Protein 6.0 - 8.5 g/dL 7.6 7.1 7.9  Total Bilirubin 0.0 - 1.2 mg/dL 1.1 1.0 1.3(H)  Alkaline Phos 39 - 117 IU/L 28(L) 28(L) 29(L)  AST 0 - 40 IU/L _2 ALT 0 - 32 IU/L _3 Lab Results  Component Value Date   WBC 4.2 07/01/2017   HGB 12.4 07/01/2017   HCT 39.0 07/01/2017   MCV 95 07/01/2017   PLT 79 (LL) 07/01/2017   NEUTROABS 2.4 07/01/2017    ASSESSMENT & PLAN:  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 She tells me that her platelet counts ranged from 60-90,000. Her problems with bleeding were severe during menarche but have subsequently improved after being treated with birth control pills. She does not have any current problems with bruising or bleeding.  Blood work review:platelet count is 82 on 07/28/2016  Prior workup: An extensive workup by Dr. Melvenia Beam (in [place]) in the past showed no qualitative platelet defect (normal platelet aggregation testing) and no evidence of ITP. Bone marrow testing in the past has shown normal megakaryocytes, and there are no other findings on her CBC today suggest new marrow dysplasias.   Differential diagnosisfor congenital thrombocytopenia as is extremely wide which include amegakaryocytic thrombocytopenia especially because of the fact that she has hearing impairment. Other X-linked thrombocytopenias are also possible as well as MYH-9 -related diseases.  Continue annual follow-ups with platelet count checks Patient could also be followed by her primary care physician and can call us if there are any further questions or concerns.    Orders Placed This Encounter  Procedures  . CBC  with Differential (Cancer Center Only)    Standing Status:   Future    Standing Expiration Date:   08/06/2018   The patient has a good understanding of the overall plan. she agrees with it. she will call with any problems that may develop before the next visit here.   Harriette Ohara, MD 08/05/17

## 2017-08-05 NOTE — Assessment & Plan Note (Signed)
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 She tells me that her platelet counts ranged from 60-90,000. Her problems with bleeding were severe during menarche but have subsequently improved after being treated with birth control pills. She does not have any current problems with bruising or bleeding.  Blood work review:platelet count is 82 on 07/28/2016  Prior workup: An extensive workup by Dr. Melvenia Beam (in [place]) in the past showed no qualitative platelet defect (normal platelet aggregation testing) and no evidence of ITP. Bone marrow testing in the past has shown normal megakaryocytes, and there are no other findings on her CBC today suggest new marrow dysplasias.   Differential diagnosisfor congenital thrombocytopenia as is extremely wide which include amegakaryocytic thrombocytopenia especially because of the fact that she has hearing impairment. Other X-linked thrombocytopenias are also possible as well as MYH-9 -related diseases.  Continue annual follow-ups with platelet count checks

## 2017-08-06 ENCOUNTER — Telehealth: Payer: Self-pay | Admitting: Hematology and Oncology

## 2017-08-06 NOTE — Telephone Encounter (Signed)
Spoke to patient regarding upcoming may 2020 appointments.

## 2017-08-10 ENCOUNTER — Encounter: Payer: Self-pay | Admitting: Obstetrics & Gynecology

## 2017-08-10 ENCOUNTER — Ambulatory Visit: Payer: Medicaid Other | Admitting: Obstetrics & Gynecology

## 2017-08-10 VITALS — BP 130/70 | HR 56 | Resp 18 | Ht 64.0 in | Wt 129.8 lb

## 2017-08-10 DIAGNOSIS — Z0000 Encounter for general adult medical examination without abnormal findings: Secondary | ICD-10-CM

## 2017-08-10 DIAGNOSIS — Z01419 Encounter for gynecological examination (general) (routine) without abnormal findings: Secondary | ICD-10-CM

## 2017-08-10 MED ORDER — NORGESTREL-ETHINYL ESTRADIOL 0.3-30 MG-MCG PO TABS: 1.0000 | ORAL_TABLET | Freq: Every day | ORAL | 4 refills | Status: DC

## 2017-08-10 NOTE — Progress Notes (Signed)
22 y.o. G0P0000 SingleAfrican AmericanF here for annual exam.  Doing well.  On OCPs.  Cycle well controlled.  Starts on time.  Does last about 7 days but only 2-3 are heavier days and these are very manageable for her.  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   Has some mild cramping and will take tylenol.  This is not that frequent.    Reports she had an irregular pulse when saw PCP in March.  Referred to cardiologist.  H/O congenital heart defect repair as infant.    Aware it looks like Tdap is due and third Gardisil vaccine.  Declines today.  States "I"m good".    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  Reviewed this diagnosis and she does not have typical features of this.  Do not have additional records.    Patient's last menstrual period was 07/22/2017 (approximate).          Sexually active: No.  The current method of family planning is OCP (estrogen/progesterone) and abstinence.    Exercising: No.   Smoker:  no  Health Maintenance: Pap:  11/20/15 Neg  History of abnormal Pap:  No Gardasil: had 2. Declined 3rd injection   TDaP: current per pt Screening Labs: PCP   reports that she has never smoked. She has never used smokeless tobacco. She reports that she does not drink alcohol or use drugs.  Past Medical History:  Diagnosis Date  . Abnormal uterine bleeding   . Blood transfusion without reported diagnosis   . Bronchopulmonary dysplasia   . Chronic lung disease   . 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 COPD (chronic obstructive pulmonary disease) ([place])   . Emphysema of lung ([place])   . History of seizures   . Meconium aspiration   . Prediabetes   . Thrombocytopenia ([place])     Past Surgical History:  Procedure Laterality Date  . CARDIAC SURGERY    . EYE SURGERY    . VSD REPAIR     and PDA repair    Current Outpatient Medications  Medication Sig Dispense Refill  . cetirizine (ZYRTEC ALLERGY) 10 MG tablet Take 1 tablet (10 mg  total) by mouth daily. (Patient taking differently: Take 10 mg by mouth at bedtime. ) 90 tablet 3  . LOW-OGESTREL 0.3-30 MG-MCG tablet TAKE 1 TABLET BY MOUTH DAILY 84 tablet 0  . Multiple Vitamin (MULTIVITAMIN WITH MINERALS) TABS tablet Take 1 tablet by mouth daily.    Marland Kitchen PROAIR HFA 108 (90 Base) MCG/ACT inhaler INHALE 2 PUFFS INTO THE LUNGS EVERY 6 HOURS AS NEEDED FOR WHEEZING OR SHORTNESS OF BREATH 8.5 g 0  . SPIRIVA HANDIHALER 18 MCG inhalation capsule PLACE ONE CAPSULE INTO INHALER AND INHALE DAILY 30 capsule 2  . tiotropium (SPIRIVA HANDIHALER) 18 MCG inhalation capsule INHALE CONTENTS OF 1 CAPSULE ONCE DAILY USING HANDIHALER 30 capsule 1  . tretinoin (RETIN-A) 0.025 % cream Apply topically at bedtime.     No current facility-administered medications for this visit.     Family History  Problem Relation Age of Onset  . Osteopenia Mother   . Hypertension Mother   . Diabetes Mother   . Breast cancer Maternal Aunt   . Cancer Maternal Aunt   . Hyperlipidemia Maternal Aunt   . Diabetes Maternal Aunt   . Lung cancer Maternal Grandmother   . Hyperlipidemia Maternal Grandmother   . Diabetes Maternal Grandmother   . COPD Maternal Grandmother   . Emphysema Maternal Grandmother   .  Sickle cell anemia Maternal Grandmother   . Hyperlipidemia Maternal Grandfather   . Diabetes Maternal Grandfather   . Hypertension Father   . Hyperlipidemia Father   . Sickle cell anemia Maternal Uncle   . Rheum arthritis Cousin     Review of Systems  All other systems reviewed and are negative.   Exam:   BP 130/70 (BP Location: Right Arm, Patient Position: Sitting, Cuff Size: Normal)   Pulse (!) 56   Resp 18   Ht _0  (1.626 m)   Wt 129 lb 12.8 oz (58.9 kg)   LMP 07/22/2017 (Approximate)   BMI 22.28 kg/m   Height:   Height: _1  (162.6 cm)  Ht Readings from Last 3 Encounters:  08/10/17 _2  (1.626 m)  08/05/17 5' 4.5" (1.638 m)  07/01/17 5' 4.5" (1.638 m)    General appearance: alert,  cooperative and appears stated age Head: Normocephalic, without obvious abnormality, atraumatic Neck: no adenopathy, supple, symmetrical, trachea midline and thyroid normal to inspection and palpation Lungs: clear to auscultation bilaterally Breasts: normal appearance, no masses or tenderness Heart: regular rate and rhythm Abdomen: soft, non-tender; bowel sounds normal; no masses,  no organomegaly Extremities: extremities normal, atraumatic, no cyanosis or edema Skin: Skin color, texture, turgor normal. No rashes or lesions Lymph nodes: Cervical, supraclavicular, and axillary nodes normal. No abnormal inguinal nodes palpated Neurologic: Grossly normal   Pelvic: External genitalia:  no lesions No internal exam performed.  Never SA and Pap smear is UTD>  Chaperone was present for exam.  A:  Well Woman with normal exam H/O menorrhagia, well controlled with OCPs Congenital heart defect, being referred to cardiology due to rate irregularity noted with PCP (normal rate today).  Has appt 08/26/17 with Dr. Christ Kick. H/O congenital thrombocytopenia, followed by Dr. Lindi Adie Developmental delays  P:   Mammogram not indicated pap smear not indicated this year RF for OCPs, 3 month supply/4RF No lab work obtained today Declines finishing Gardisil vaccination or Tdap Return annually or prn

## 2017-08-17 ENCOUNTER — Telehealth: Payer: Self-pay | Admitting: Obstetrics & Gynecology

## 2017-08-17 NOTE — Telephone Encounter (Signed)
Spoke with patient and advised that RX was sent to [facility] on 08-10-17 -eh

## 2017-08-17 NOTE — Telephone Encounter (Signed)
Patient requests refills on birth control medication she is completely out. She started her cycle today.    Patient uses [facility] on [address]

## 2017-08-26 ENCOUNTER — Ambulatory Visit: Payer: BLUE CROSS/BLUE SHIELD | Admitting: Interventional Cardiology

## 2017-08-26 ENCOUNTER — Encounter: Payer: Self-pay | Admitting: Interventional Cardiology

## 2017-08-26 VITALS — BP 102/58 | HR 84 | Ht 64.0 in | Wt 133.0 lb

## 2017-08-26 DIAGNOSIS — R9431 Abnormal electrocardiogram [ECG] [EKG]: Secondary | ICD-10-CM | POA: Diagnosis not present

## 2017-08-26 DIAGNOSIS — Q210 Ventricular septal defect: Secondary | ICD-10-CM | POA: Diagnosis not present

## 2017-08-26 DIAGNOSIS — Q250 Patent ductus arteriosus: Secondary | ICD-10-CM | POA: Diagnosis not present

## 2017-08-26 NOTE — Progress Notes (Signed)
Cardiology Office Note   Date:  08/26/2017   ID:  Tammie, Wade [birth year], MRN [id number]  PCP:  [name], NP-C    No chief complaint on file.  H/o VSD   Wt Readings from Last 3 Encounters:  08/26/17 133 lb (60.3 kg)  08/10/17 129 lb 12.8 oz (58.9 kg)  08/05/17 132 lb (59.9 kg)       History of Present Illness: Tammie Wade is a 22 y.o. female who is being seen today for the evaluation of abnormal ECG at the request of Tammie, Vickie L, NP-C.  She has congenital thrombocytopenia.  SHe had a congenital  And PDA repaired at age 47 weeks.  She has chronic lung issues.  No smoking.  Better with inhaler.  Denies : Chest pain. Dizziness. Leg edema. Nitroglycerin use. Orthopnea. Palpitations. Paroxysmal nocturnal dyspnea.  Syncope.   Has some GERD sx.   SHe moved from [place]. A few years ago.  She had a cardiologist there.  She was not using any SBE prophylaxis. She does not recall having a recent ultrasound of her heart.  Past Medical History:  Diagnosis Date  . Abnormal uterine bleeding   . Blood transfusion without reported diagnosis   . Bronchopulmonary dysplasia   . Chronic lung disease   . 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 COPD (chronic obstructive pulmonary disease) ([place])   . Emphysema of lung ([place])   . History of seizures   . Meconium aspiration   . Prediabetes   . Thrombocytopenia ([place])     Past Surgical History:  Procedure Laterality Date  . CARDIAC SURGERY    . EYE SURGERY    . VSD REPAIR     and PDA repair     Current Outpatient Medications  Medication Sig Dispense Refill  . cetirizine (ZYRTEC ALLERGY) 10 MG tablet Take 1 tablet (10 mg total) by mouth daily. (Patient taking differently: Take 10 mg by mouth at bedtime. ) 90 tablet 3  . Multiple Vitamin (MULTIVITAMIN WITH MINERALS) TABS tablet Take 1 tablet by mouth daily.    . norgestrel-ethinyl estradiol (LOW-OGESTREL) 0.3-30 MG-MCG tablet Take 1 tablet by mouth daily. 84 tablet 4  .  PROAIR HFA 108 (90 Base) MCG/ACT inhaler INHALE 2 PUFFS INTO THE LUNGS EVERY 6 HOURS AS NEEDED FOR WHEEZING OR SHORTNESS OF BREATH 8.5 g 0  . SPIRIVA HANDIHALER 18 MCG inhalation capsule PLACE ONE CAPSULE INTO INHALER AND INHALE DAILY 30 capsule 2  . tiotropium (SPIRIVA HANDIHALER) 18 MCG inhalation capsule INHALE CONTENTS OF 1 CAPSULE ONCE DAILY USING HANDIHALER 30 capsule 1  . tretinoin (RETIN-A) 0.025 % cream Apply topically at bedtime.     No current facility-administered medications for this visit.     Allergies:   Patient has no known allergies.    Social History:  The patient  reports that she has never smoked. She has never used smokeless tobacco. She reports that she does not drink alcohol or use drugs.   Family History:  The patient's family history includes Breast cancer in her maternal aunt; COPD in her maternal grandmother; Cancer in her maternal aunt; Diabetes in her maternal aunt, maternal grandfather, maternal grandmother, and mother; Emphysema in her maternal grandmother; Hyperlipidemia in her father, maternal aunt, maternal grandfather, and maternal grandmother; Hypertension in her father and mother; Lung cancer in her maternal grandmother; Osteopenia in her mother; Rheum arthritis in her cousin; Sickle cell anemia in her maternal grandmother and maternal uncle.    ROS:  Please see the history of present illness.   Otherwise, review of systems are positive for some chronic breathing issues.   All other systems are reviewed and negative.    PHYSICAL EXAM: VS:  BP (!) 102/58 (BP Location: Right Arm, Patient Position: Sitting, Cuff Size: Normal)   Pulse 84   Ht _0  (1.626 m)   Wt 133 lb (60.3 kg)   PF 99 [place]/min   BMI 22.83 kg/m  , BMI Body mass index is 22.83 kg/m. GEN: Well nourished, well developed, in no acute distress  HEENT: normal  Neck: no JVD, carotid bruits, or masses Cardiac: RRR; no murmurs, rubs, or gallops,no edema  Respiratory:  clear to auscultation  bilaterally, normal work of breathing GI: soft, nontender, nondistended, + BS MS: no deformity or atrophy  Skin: warm and dry, no rash Neuro:  Strength and sensation are intact Psych: euthymic mood, full affect   EKG:   The ekg ordered from 3/19 demonstrates NSR, possible atrial enlargement, anterior TWI   Recent Labs: 07/01/2017: ALT 17; BUN 10; Creatinine, Ser 0.72; Hemoglobin 12.4; Platelets 79; Potassium 4.1; Sodium 142; TSH 1.430   Lipid Panel    Component Value Date/Time   CHOL 120 (Wade) 07/02/2015 0001   TRIG 91 07/02/2015 0001   HDL 49 07/02/2015 0001   CHOLHDL 2.4 07/02/2015 0001   VLDL 18 07/02/2015 0001   LDLCALC 53 07/02/2015 0001     Other studies Reviewed: Additional studies/ records that were reviewed today with results demonstrating: Care everywhere notes reviewed.   ASSESSMENT AND PLAN:  1. H/o VSD/PDA: repaired at a young age.  Check echo to look for residual shunts and establish baseline since she has moved here. No murmurs on exam.   2. Abnormal ECG: Changes noted above.  Doubt ischemia given her age.  Likely related to prior heart surgery.     Current medicines are reviewed at length with the patient today.  The patient concerns regarding her medicines were addressed.  The following changes have been made:  No change  Labs/ tests ordered today include:  No orders of the defined types were placed in this encounter.   Recommend 150 minutes/week of aerobic exercise Low fat, low carb, high fiber diet recommended  Disposition:   FU in 1 year   Signed, Larae Grooms, MD  08/26/2017 1:54 PM    [facility] [address] [place], [place], [place]  [address] Phone: [phone]; Fax: [phone]

## 2017-08-26 NOTE — Patient Instructions (Signed)
Medication Instructions:  Your physician recommends that you continue on your current medications as directed. Please refer to the Current Medication list given to you today.   Labwork: None ordered  Testing/Procedures: Your physician has requested that you have an echocardiogram. Echocardiography is a painless test that uses sound waves to create images of your heart. It provides your doctor with information about the size and shape of your heart and how well your heart's chambers and valves are working. This procedure takes approximately one hour. There are no restrictions for this procedure.  Follow-Up: Your physician wants you to follow-up in: 1 year with Dr. [name]. You will receive a reminder letter in the mail two months in advance. If you don't receive a letter, please call our office to schedule the follow-up appointment.   Any Other Special Instructions Will Be Listed Below (If Applicable).  Echocardiogram An echocardiogram, or echocardiography, uses sound waves (ultrasound) to produce an image of your heart. The echocardiogram is simple, painless, obtained within a short period of time, and offers valuable information to your health care provider. The images from an echocardiogram can provide information such as:  Evidence of coronary artery disease (CAD).  Heart size.  Heart muscle function.  Heart valve function.  Aneurysm detection.  Evidence of a past heart attack.  Fluid buildup around the heart.  Heart muscle thickening.  Assess heart valve function.  Tell a health care provider about:  Any allergies you have.  All medicines you are taking, including vitamins, herbs, eye drops, creams, and over-the-counter medicines.  Any problems you or family members have had with anesthetic medicines.  Any blood disorders you have.  Any surgeries you have had.  Any medical conditions you have.  Whether you are pregnant or may be pregnant. What happens before  the procedure? No special preparation is needed. Eat and drink normally. What happens during the procedure?  In order to produce an image of your heart, gel will be applied to your chest and a wand-like tool (transducer) will be moved over your chest. The gel will help transmit the sound waves from the transducer. The sound waves will harmlessly bounce off your heart to allow the heart images to be captured in real-time motion. These images will then be recorded.  You may need an IV to receive a medicine that improves the quality of the pictures. What happens after the procedure? You may return to your normal schedule including diet, activities, and medicines, unless your health care provider tells you otherwise. This information is not intended to replace advice given to you by your health care provider. Make sure you discuss any questions you have with your health care provider. Document Released: 03/20/2000 Document Revised: 11/09/2015 Document Reviewed: 11/28/2012 Elsevier Interactive Patient Education  2017 [employer].    If you need a refill on your cardiac medications before your next appointment, please call your pharmacy.

## 2017-09-02 ENCOUNTER — Ambulatory Visit (HOSPITAL_COMMUNITY): Payer: BLUE CROSS/BLUE SHIELD | Attending: Cardiovascular Disease

## 2017-09-02 ENCOUNTER — Other Ambulatory Visit: Payer: Self-pay

## 2017-09-02 DIAGNOSIS — I083 Combined rheumatic disorders of mitral, aortic and tricuspid valves: Secondary | ICD-10-CM | POA: Diagnosis not present

## 2017-09-02 DIAGNOSIS — Q210 Ventricular septal defect: Secondary | ICD-10-CM | POA: Diagnosis present

## 2017-09-02 DIAGNOSIS — R9431 Abnormal electrocardiogram [ECG] [EKG]: Secondary | ICD-10-CM | POA: Diagnosis present

## 2017-09-02 DIAGNOSIS — Q250 Patent ductus arteriosus: Secondary | ICD-10-CM | POA: Insufficient documentation

## 2017-09-08 ENCOUNTER — Telehealth: Payer: Self-pay | Admitting: Interventional Cardiology

## 2017-09-08 NOTE — Telephone Encounter (Signed)
-----  Message from [name], MD sent at 09/07/2017  5:26 PM EDT ----- No residual shunts noted.  Mild AI.  Heart function isadequate given prior history of cardiac surgery.

## 2017-09-08 NOTE — Telephone Encounter (Signed)
Attempted to return call to patient to give echo results. There was no answer. Left message for patient to call back.

## 2017-09-08 NOTE — Telephone Encounter (Signed)
Follow Up:    Returning your call,concerning her Echo results.

## 2017-09-13 NOTE — Telephone Encounter (Signed)
Notes recorded by [name], MD on 09/07/2017 at 5:26 PM EDT No residual shunts noted. Mild AI. Heart function isadequate given prior history of cardiac surgery.   Pt aware of echo results per Dr [name], as indicated above.  Pt verbalized understanding.

## 2017-09-13 NOTE — Telephone Encounter (Signed)
New Message   Pt returning call to [place] about echo results. Please call

## 2017-10-20 ENCOUNTER — Other Ambulatory Visit: Payer: Self-pay | Admitting: Family Medicine

## 2017-10-21 NOTE — Telephone Encounter (Signed)
Sees pulmonology

## 2017-11-05 ENCOUNTER — Other Ambulatory Visit: Payer: Self-pay | Admitting: Family Medicine

## 2017-12-20 ENCOUNTER — Telehealth: Payer: Self-pay | Admitting: Obstetrics & Gynecology

## 2017-12-20 NOTE — Telephone Encounter (Signed)
Patient is having problems with her cycle.

## 2017-12-20 NOTE — Telephone Encounter (Signed)
Spoke with patient. Requesting OV for irregular menses and increased cramping with menses. LMP 8/3 and again 9/14. Reports menses  lasting longer and becoming more irregular. Is unsure of how often she is changing her pad, has had the same one all morning, not saturated. On OCP, denies missed or late pills. Never SA. Denies any other GYN symptoms.    OV scheduled for 9/17 at 2:45pm with Dr. Sabra Heck. Patient declined earlier time.  Last AEX 08/10/17  Routing to provider for final review. Patient is agreeable to disposition. Will close encounter.

## 2017-12-21 ENCOUNTER — Other Ambulatory Visit: Payer: Self-pay

## 2017-12-21 ENCOUNTER — Ambulatory Visit (INDEPENDENT_AMBULATORY_CARE_PROVIDER_SITE_OTHER): Payer: 59 | Admitting: Obstetrics & Gynecology

## 2017-12-21 ENCOUNTER — Encounter: Payer: Self-pay | Admitting: Obstetrics & Gynecology

## 2017-12-21 ENCOUNTER — Telehealth: Payer: Self-pay | Admitting: Obstetrics & Gynecology

## 2017-12-21 VITALS — BP 120/60 | HR 88 | Resp 16 | Ht 64.0 in | Wt 126.0 lb

## 2017-12-21 DIAGNOSIS — N926 Irregular menstruation, unspecified: Secondary | ICD-10-CM | POA: Diagnosis not present

## 2017-12-21 LAB — POCT URINE PREGNANCY: Preg Test, Ur: NEGATIVE

## 2017-12-21 NOTE — Progress Notes (Signed)
Patient scheduled while in office for PUS/transabdominal only, on 12/30/17 at 1:30pm, consult at 2pm with Dr. Sabra Heck. Instructed patient to arrive with full bladder and drink 32 oz water 1 hr prior to appt. Instructions provided in writing. Patient verbalizes understanding and is agreeable.

## 2017-12-21 NOTE — Telephone Encounter (Signed)
Patient was seen today and has has a questions for Dr.Miller.

## 2017-12-21 NOTE — Telephone Encounter (Signed)
Spoke with patient. Patient states she forgot to tell Dr. Sabra Heck she is interested in [facility] that allows for menses q3 months. Patient will plan to discuss at [place] visit on 12/30/17.   Routing to provider for final review. Patient is agreeable to disposition. Will close encounter.

## 2017-12-21 NOTE — Progress Notes (Signed)
GYNECOLOGY  VISIT  CC:   irregular  HPI: 22 y.o. G0P0000 Single Black or [place] American female here for irregular bleeding.  Typically, she she has 4-5 day cycle.  The past two months, her bleeding has not occurred during the placebo week and she's also had irregular bleeding.  Pt does not keep complete track of her bleeding with her phone.  In keeping track, she keeps track of when her bleeding starts but not when bleeding stops. She does not have her current pack of pills with her.  Not sure where she is in her back of pills.   Not sexually active.  Denies vaginal discahrge.  GYNECOLOGIC HISTORY: Patient's last menstrual period was 12/18/2017 (approximate). Contraception: OCP Menopausal hormone therapy: none  Patient Active Problem List   Diagnosis Date Noted  . VSD (ventricular septal defect) 08/26/2017  . PDA (patent ductus arteriosus) 08/26/2017  . Abnormal ECG 08/26/2017  . Family history of tuberous sclerosis 09/23/2016  . Menorrhagia with irregular cycle 11/20/2015  . 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 COPD, severe ([place]) 07/12/2015  . Emphysema lung ([place]) 07/12/2015  . GERD (gastroesophageal reflux disease) 07/12/2015  . 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 Abnormal uterine bleeding   . Blood transfusion without reported diagnosis   . Bronchopulmonary dysplasia   . Chronic lung disease   . 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 COPD (chronic obstructive pulmonary disease) ([place])   . Emphysema of lung ([place])   . History of seizures   . Meconium aspiration   . Prediabetes   . Thrombocytopenia ([place])     Past Surgical History:  Procedure Laterality Date  . CARDIAC SURGERY    . EYE SURGERY    . VSD REPAIR     and PDA repair    MEDS:   Current Outpatient Medications on File Prior to Visit  Medication Sig Dispense Refill  . cetirizine (ZYRTEC ALLERGY) 10 MG tablet Take 1 tablet (10 mg total) by mouth  daily. (Patient taking differently: Take 10 mg by mouth at bedtime. ) 90 tablet 3  . Multiple Vitamin (MULTIVITAMIN WITH MINERALS) TABS tablet Take 1 tablet by mouth daily.    . norgestrel-ethinyl estradiol (LOW-OGESTREL) 0.3-30 MG-MCG tablet Take 1 tablet by mouth daily. 84 tablet 4  . PROAIR HFA 108 (90 Base) MCG/ACT inhaler INHALE 2 PUFFS INTO THE LUNGS EVERY 6 HOURS AS NEEDED FOR WHEEZING OR SHORTNESS OF BREATH 8.5 g 0  . SPIRIVA HANDIHALER 18 MCG inhalation capsule PLACE ONE CAPSULE INTO INHALER AND INHALE DAILY 30 capsule 2  . tiotropium (SPIRIVA HANDIHALER) 18 MCG inhalation capsule INHALE THE CONTENTS OF 1 CAPSULE VIA INHALATION DEVICE EVERY DAY 30 capsule 1  . tretinoin (RETIN-A) 0.025 % cream Apply topically at bedtime.     No current facility-administered medications on file prior to visit.     ALLERGIES: Patient has no known allergies.  Family History  Problem Relation Age of Onset  . Osteopenia Mother   . Hypertension Mother   . Diabetes Mother   . Breast cancer Maternal Aunt   . Cancer Maternal Aunt   . Hyperlipidemia Maternal Aunt   . Diabetes Maternal Aunt   . Lung cancer Maternal Grandmother   . Hyperlipidemia Maternal Grandmother   . Diabetes Maternal Grandmother   . COPD Maternal Grandmother   . Emphysema Maternal Grandmother   . Sickle cell anemia Maternal Grandmother   . Hyperlipidemia Maternal Grandfather   . Diabetes Maternal Grandfather   .  Hypertension Father   . Hyperlipidemia Father   . Sickle cell anemia Maternal Uncle   . Rheum arthritis Cousin     SH:  Single, non smoker  Review of Systems  Genitourinary: Positive for menstrual problem.  All other systems reviewed and are negative.   PHYSICAL EXAMINATION:    BP 120/60 (BP Location: Right Arm, Patient Position: Sitting, Cuff Size: Normal)   Pulse 88   Resp 16   Ht _0  (1.626 m)   Wt 126 lb (57.2 kg)   LMP 12/18/2017 (Approximate)   BMI 21.63 kg/m     General appearance: alert,  cooperative and appears stated age CV:  Regular rate and rhythm Lungs:  clear to auscultation, no wheezes, rales or rhonchi, symmetric air entry Abdomen: soft, non-tender; bowel sounds normal; no masses,  no organomegaly Lymph:  no inguinal LAD noted  Pelvic: External genitalia:  no lesions              Urethra:  normal appearing urethra with no masses, tenderness or lesions              Bartholins and Skenes: normal                After several attempts at doing a pelvic exam, this was stopped due to pt discomfort  Chaperone was present for exam.  Assessment: Irregular bleeding, hard to exactly know frequency due to pt not keeping clear track of bleeding patterns Cannot perform a pelvic exam today  Plan: Pt will return for ultrasound but will plan to do this transabdominally.   ~15 minutes spent with patient >50% of time was in face to face discussion of above.

## 2017-12-30 ENCOUNTER — Ambulatory Visit: Payer: Medicaid Other | Admitting: Obstetrics & Gynecology

## 2017-12-30 ENCOUNTER — Encounter: Payer: Self-pay | Admitting: Obstetrics & Gynecology

## 2017-12-30 ENCOUNTER — Ambulatory Visit (INDEPENDENT_AMBULATORY_CARE_PROVIDER_SITE_OTHER): Payer: 59

## 2017-12-30 VITALS — BP 112/60 | HR 84 | Resp 16 | Ht 64.0 in | Wt 128.0 lb

## 2017-12-30 DIAGNOSIS — N83319 Acquired atrophy of ovary, unspecified side: Secondary | ICD-10-CM | POA: Diagnosis not present

## 2017-12-30 DIAGNOSIS — N926 Irregular menstruation, unspecified: Secondary | ICD-10-CM

## 2017-12-30 DIAGNOSIS — N857 Hematometra: Secondary | ICD-10-CM | POA: Diagnosis not present

## 2017-12-30 NOTE — Progress Notes (Signed)
22 y.o. G0P0000 Single Black or [place] American female here for pelvic ultrasound due to irregular bleeding.  She does have some developmental delays and has never been SA so pelvic exam is very difficult.    She did bring a calendar with her of recent bleeding.  Started current pack of pills on 9/15.  Last cycle was 9/14-9/21.  This was basically the first week of her pills.  She is now on the third pack of this current pill.  She thinks all of her cycles have occurred the first week of the pills.  Patient's last menstrual period was 12/18/2017 (approximate).  Contraception: Not SA, on OCPs  Findings:  UTERUS: 8.2 x 4.9 x 3.1cm EMS: fluid within cavity measuring almost 2.0cm noted, appears to be hematometra ADNEXA: Left ovary: 2.2 x 1.1 x 0.5cm       Right ovary: 2.4 x 1.2 x 0.7cm.  Both ovaries are very small. CUL DE SAC: no free fluid  Discussion:  Findings reviewed with pt and her mother (which were called on my cell phone and spoke with via speaker phone.  Due to amount of fluid in endometrial cavity, feel dilation and curettage is appropriate.  Procedure, outpatient nature of procedure, risks and benefits discussed.  Risks discussed including but not limited to bleeding, rare risk of transfusion, infection, 1% risk of uterine perforation with risk of needing to stop procedure as well as possible injury to surrounding structures including bowel/bladder/ureters.  DVT/PE, rare risk of risk of bowel/bladder/ureteral/vascular injury.  Pathology would be sent with procedure as well.  All questions answered.  .  Assessment:  Irregular bleeding Hematometra Small ovaries  Plan:  Will plan to proceed with D&C FSH obtained today.  ~25 minutes spent with patient >50% of time was in face to face discussion of above.

## 2017-12-31 LAB — FOLLICLE STIMULATING HORMONE: FSH: <0.2 m[IU]/mL

## 2018-01-02 DIAGNOSIS — Z8774 Personal history of (corrected) congenital malformations of heart and circulatory system: Secondary | ICD-10-CM | POA: Insufficient documentation

## 2018-01-02 DIAGNOSIS — M419 Scoliosis, unspecified: Secondary | ICD-10-CM | POA: Insufficient documentation

## 2018-01-06 ENCOUNTER — Telehealth: Payer: Self-pay | Admitting: Obstetrics & Gynecology

## 2018-01-06 NOTE — Telephone Encounter (Signed)
Patient called stating she was returning a call to our office from this morning. She said no message was left with any details but she is waiting to schedule a procedure. No active orders or referrals are entered so I'm routing this call to triage nurse per patient request.

## 2018-01-06 NOTE — Telephone Encounter (Signed)
Patient's mother states she is returning a call from our office regarding a procedure for her daughter.

## 2018-01-06 NOTE — Telephone Encounter (Signed)
Call to patient's mother, [name]. Surgery date options reviewed.  Will schedule for 01-24-18. Surgery instruction sheet reviewed and printed copy will be mailed.  Routing to Dr Sabra Heck. Encounter closed.

## 2018-01-06 NOTE — Telephone Encounter (Signed)
Spoke with patients mom, [name], ok per dpr. Mom states she was returning call to discuss surgery dates. Advised mom our benefits department will review surgery benefits and return call to review. Once this is done, our surgery scheduler will return call for further surgery planning, such as available dates. Mom verbalizes understanding and thankful for return call, no further questions at this time.   Routing to [employer]  and [employer].  Cc: Lamont Snowball, RN

## 2018-01-06 NOTE — Telephone Encounter (Signed)
Call to patient to convey insurance benefits. Patient agreeable and ready to proceed with scheduling surgery. Forwarded to Lamont Snowball, RN.

## 2018-01-07 ENCOUNTER — Other Ambulatory Visit: Payer: Self-pay | Admitting: Obstetrics & Gynecology

## 2018-01-14 NOTE — Patient Instructions (Addendum)
Your procedure is scheduled on: Monday January 24, 2018 at 10:30 am  Enter through the Main Entrance of [facility] at: 9:00 am  Pick up the phone at the desk and dial [phone].  Call this number if you have problems the morning of surgery: [phone].  Remember: Do NOT eat food or drink any liquids includiing water: after Midnight on Sunday October 20  Take these medicines the morning of surgery with a SIP OF WATER:  Birth control pill.Madaline Brilliant to use [place] inhaler  Bring your ProAir inhaler with you on day of surgery  STOP ALL VITAMINS, SUPPLEMENTS, HERBAL MEDICATIONS, NSAIDS NOW  DO NOT SMOKE DAY OF SURGERY  BRUSH YOUR TEETH DAY OF SURGERY  Do NOT wear jewelry (body piercing), metal hair clips/bobby pins, make-up, or nail polish. Do NOT wear lotions, powders, or perfumes.  You may wear deoderant. Do NOT shave for 48 hours prior to surgery. Do NOT bring valuables to the hospital. Contacts may not be worn into surgery.  Have a responsible adult drive you home and stay with you for 24 hours after your procedure.  Home with Mother [name] cell [phone]

## 2018-01-19 ENCOUNTER — Encounter (HOSPITAL_COMMUNITY): Payer: Self-pay

## 2018-01-19 ENCOUNTER — Other Ambulatory Visit: Payer: Self-pay

## 2018-01-19 ENCOUNTER — Encounter (HOSPITAL_COMMUNITY)
Admission: RE | Admit: 2018-01-19 | Discharge: 2018-01-19 | Disposition: A | Discharge status: Home / Self Care | Payer: BLUE CROSS/BLUE SHIELD | Source: Ambulatory Visit | Attending: Obstetrics & Gynecology | Admitting: Obstetrics & Gynecology

## 2018-01-19 DIAGNOSIS — Z01812 Encounter for preprocedural laboratory examination: Secondary | ICD-10-CM | POA: Diagnosis present

## 2018-01-19 HISTORY — DX: Unspecified hearing loss, unspecified ear: H91.90

## 2018-01-19 HISTORY — DX: Emphysema, unspecified: J43.9

## 2018-01-19 HISTORY — DX: Developmental disorder of scholastic skills, unspecified: F81.9

## 2018-01-19 LAB — CBC
HEMATOCRIT: 33.5 % — AB (ref 36.0–46.0)
Hemoglobin: 10.7 g/dL — ABNORMAL LOW (ref 12.0–15.0)
MCH: 28.7 pg (ref 26.0–34.0)
MCHC: 31.9 g/dL (ref 30.0–36.0)
MCV: 89.8 fL (ref 80.0–100.0)
PLATELETS: 90 10*3/uL — AB (ref 150–400)
RBC: 3.73 MIL/uL — AB (ref 3.87–5.11)
RDW: 13.1 % (ref 11.5–15.5)
WBC: 4 10*3/uL (ref 4.0–10.5)
nRBC: 0 % (ref 0.0–0.2)

## 2018-01-19 NOTE — Pre-Procedure Instructions (Signed)
Reviewed patient's medical/surgical history, medications, EKG and Echo results with Dr. Ambrose Pancoast.  No orders given.  [place] for surgery.  SDS BB History Log sent to Lab for patient's previous history of blood transfusion in [place] at the age of 63.

## 2018-01-19 NOTE — Pre-Procedure Instructions (Signed)
Dr Ambrose Pancoast informed of patient's low platelets 90.  Repeat platelet count on DOS.  No other orders given.  [place] for surgery.

## 2018-01-24 ENCOUNTER — Ambulatory Visit (HOSPITAL_COMMUNITY): Payer: BLUE CROSS/BLUE SHIELD | Admitting: Anesthesiology

## 2018-01-24 ENCOUNTER — Ambulatory Visit (HOSPITAL_COMMUNITY)
Admission: RE | Admit: 2018-01-24 | Discharge: 2018-01-24 | Disposition: A | Discharge status: Home / Self Care | Payer: BLUE CROSS/BLUE SHIELD | Source: Ambulatory Visit | Attending: Obstetrics & Gynecology | Admitting: Obstetrics & Gynecology

## 2018-01-24 ENCOUNTER — Other Ambulatory Visit: Payer: Self-pay

## 2018-01-24 ENCOUNTER — Encounter (HOSPITAL_COMMUNITY): Payer: Self-pay

## 2018-01-24 ENCOUNTER — Encounter (HOSPITAL_COMMUNITY)
Admission: RE | Disposition: A | Discharge status: Home / Self Care | Payer: Self-pay | Source: Ambulatory Visit | Attending: Obstetrics & Gynecology

## 2018-01-24 DIAGNOSIS — R6250 Unspecified lack of expected normal physiological development in childhood: Secondary | ICD-10-CM | POA: Diagnosis not present

## 2018-01-24 DIAGNOSIS — Z8774 Personal history of (corrected) congenital malformations of heart and circulatory system: Secondary | ICD-10-CM | POA: Diagnosis not present

## 2018-01-24 DIAGNOSIS — J449 Chronic obstructive pulmonary disease, unspecified: Secondary | ICD-10-CM | POA: Insufficient documentation

## 2018-01-24 DIAGNOSIS — N857 Hematometra: Secondary | ICD-10-CM | POA: Insufficient documentation

## 2018-01-24 DIAGNOSIS — R7303 Prediabetes: Secondary | ICD-10-CM | POA: Insufficient documentation

## 2018-01-24 DIAGNOSIS — N938 Other specified abnormal uterine and vaginal bleeding: Secondary | ICD-10-CM | POA: Diagnosis not present

## 2018-01-24 HISTORY — PX: DILATATION & CURETTAGE/HYSTEROSCOPY WITH MYOSURE: SHX6511

## 2018-01-24 LAB — PREGNANCY, URINE: Preg Test, Ur: NEGATIVE

## 2018-01-24 LAB — PLATELET COUNT: Platelets: 101 10*3/uL — ABNORMAL LOW (ref 150–400)

## 2018-01-24 SURGERY — DILATATION & CURETTAGE/HYSTEROSCOPY WITH MYOSURE
Anesthesia: General | Site: Vagina

## 2018-01-24 MED ORDER — KETOROLAC TROMETHAMINE 30 MG/ML IJ SOLN
INTRAMUSCULAR | Status: DC | PRN
  Administered 2018-01-24: 30 mg via INTRAVENOUS

## 2018-01-24 MED ORDER — KETOROLAC TROMETHAMINE 30 MG/ML IJ SOLN
INTRAMUSCULAR | Status: AC
  Filled 2018-01-24: qty 1

## 2018-01-24 MED ORDER — FENTANYL CITRATE (PF) 100 MCG/2ML IJ SOLN: 25.0000 ug | INTRAMUSCULAR | Status: DC | PRN

## 2018-01-24 MED ORDER — LIDOCAINE 5 % EX OINT: 1.0000 "application " | TOPICAL_OINTMENT | Freq: Three times a day (TID) | CUTANEOUS | 0 refills | Status: DC

## 2018-01-24 MED ORDER — LIDOCAINE HCL (CARDIAC) PF 100 MG/5ML IV SOSY
PREFILLED_SYRINGE | INTRAVENOUS | Status: AC
  Filled 2018-01-24: qty 5

## 2018-01-24 MED ORDER — HYDROCODONE-ACETAMINOPHEN 5-325 MG PO TABS: 1.0000 | ORAL_TABLET | Freq: Four times a day (QID) | ORAL | 0 refills | Status: DC | PRN

## 2018-01-24 MED ORDER — FENTANYL CITRATE (PF) 100 MCG/2ML IJ SOLN
INTRAMUSCULAR | Status: AC
  Filled 2018-01-24: qty 2

## 2018-01-24 MED ORDER — DEXAMETHASONE SODIUM PHOSPHATE 4 MG/ML IJ SOLN
INTRAMUSCULAR | Status: DC | PRN
  Administered 2018-01-24: 4 mg via INTRAVENOUS

## 2018-01-24 MED ORDER — OXYCODONE HCL 5 MG PO TABS
ORAL_TABLET | ORAL | Status: AC
  Filled 2018-01-24: qty 1

## 2018-01-24 MED ORDER — FENTANYL CITRATE (PF) 100 MCG/2ML IJ SOLN
INTRAMUSCULAR | Status: DC | PRN
  Administered 2018-01-24: 50 ug via INTRAVENOUS

## 2018-01-24 MED ORDER — MIDAZOLAM HCL 2 MG/2ML IJ SOLN
INTRAMUSCULAR | Status: AC
  Filled 2018-01-24: qty 2

## 2018-01-24 MED ORDER — LIDOCAINE HCL (CARDIAC) PF 100 MG/5ML IV SOSY
PREFILLED_SYRINGE | INTRAVENOUS | Status: DC | PRN
  Administered 2018-01-24: 60 mg via INTRAVENOUS

## 2018-01-24 MED ORDER — PHENYLEPHRINE HCL 10 MG/ML IJ SOLN
INTRAMUSCULAR | Status: DC | PRN
  Administered 2018-01-24: 80 ug via INTRAVENOUS
  Administered 2018-01-24 (×2): 40 ug via INTRAVENOUS

## 2018-01-24 MED ORDER — OXYCODONE HCL 5 MG/5ML PO SOLN: 5.0000 mg | Freq: Once | ORAL | Status: AC | PRN

## 2018-01-24 MED ORDER — OXYCODONE HCL 5 MG PO TABS
5.0000 mg | ORAL_TABLET | Freq: Once | ORAL | Status: AC | PRN
  Administered 2018-01-24: 5 mg via ORAL

## 2018-01-24 MED ORDER — SODIUM CHLORIDE 0.9 % IR SOLN
Status: DC | PRN
  Administered 2018-01-24: 3000 mL

## 2018-01-24 MED ORDER — PROPOFOL 10 MG/ML IV BOLUS
INTRAVENOUS | Status: DC | PRN
  Administered 2018-01-24: 30 mg via INTRAVENOUS
  Administered 2018-01-24: 100 mg via INTRAVENOUS

## 2018-01-24 MED ORDER — GLYCOPYRROLATE 0.2 MG/ML IJ SOLN
INTRAMUSCULAR | Status: AC
  Filled 2018-01-24: qty 1

## 2018-01-24 MED ORDER — ONDANSETRON HCL 4 MG/2ML IJ SOLN: 4.0000 mg | Freq: Once | INTRAMUSCULAR | Status: DC | PRN

## 2018-01-24 MED ORDER — DEXAMETHASONE SODIUM PHOSPHATE 4 MG/ML IJ SOLN
INTRAMUSCULAR | Status: AC
  Filled 2018-01-24: qty 1

## 2018-01-24 MED ORDER — ONDANSETRON HCL 4 MG/2ML IJ SOLN
INTRAMUSCULAR | Status: DC | PRN
  Administered 2018-01-24: 4 mg via INTRAVENOUS

## 2018-01-24 MED ORDER — ONDANSETRON HCL 4 MG/2ML IJ SOLN
INTRAMUSCULAR | Status: AC
  Filled 2018-01-24: qty 2

## 2018-01-24 MED ORDER — LIDOCAINE-EPINEPHRINE 1 %-1:100000 IJ SOLN
INTRAMUSCULAR | Status: AC
  Filled 2018-01-24: qty 1

## 2018-01-24 MED ORDER — PHENYLEPHRINE 40 MCG/ML (10ML) SYRINGE FOR IV PUSH (FOR BLOOD PRESSURE SUPPORT)
PREFILLED_SYRINGE | INTRAVENOUS | Status: AC
  Filled 2018-01-24: qty 10

## 2018-01-24 MED ORDER — LACTATED RINGERS IV SOLN
INTRAVENOUS | Status: DC
  Administered 2018-01-24: 11:00:00 via INTRAVENOUS
  Administered 2018-01-24: 125 mL/h via INTRAVENOUS

## 2018-01-24 MED ORDER — PROPOFOL 10 MG/ML IV BOLUS
INTRAVENOUS | Status: AC
  Filled 2018-01-24: qty 20

## 2018-01-24 MED ORDER — GLYCOPYRROLATE 0.2 MG/ML IJ SOLN
INTRAMUSCULAR | Status: DC | PRN
  Administered 2018-01-24: 0.1 mg via INTRAVENOUS

## 2018-01-24 MED ORDER — MIDAZOLAM HCL 2 MG/2ML IJ SOLN
INTRAMUSCULAR | Status: DC | PRN
  Administered 2018-01-24: 1 mg via INTRAVENOUS

## 2018-01-24 SURGICAL SUPPLY — 16 items
CANISTER SUCT 3000ML PPV (MISCELLANEOUS) ×2 IMPLANT
CATH ROBINSON RED A/P 16FR (CATHETERS) ×2 IMPLANT
DEVICE MYOSURE LITE (MISCELLANEOUS) IMPLANT
DEVICE MYOSURE REACH (MISCELLANEOUS) IMPLANT
DILATOR CANAL MILEX (MISCELLANEOUS) IMPLANT
FILTER ARTHROSCOPY CONVERTOR (FILTER) ×2 IMPLANT
GLOVE BIOGEL PI IND STRL 7.0 (GLOVE) ×2 IMPLANT
GLOVE BIOGEL PI INDICATOR 7.0 (GLOVE) ×2
GLOVE ECLIPSE 6.5 STRL STRAW (GLOVE) ×2 IMPLANT
GOWN STRL REUS W/TWL LRG LVL3 (GOWN DISPOSABLE) ×4 IMPLANT
PACK VAGINAL MINOR WOMEN LF (CUSTOM PROCEDURE TRAY) ×2 IMPLANT
PAD OB MATERNITY 4.3X12.25 (PERSONAL CARE ITEMS) ×2 IMPLANT
SEAL ROD LENS SCOPE MYOSURE (ABLATOR) ×2 IMPLANT
TOWEL OR 17X24 6PK STRL BLUE (TOWEL DISPOSABLE) ×4 IMPLANT
TUBING AQUILEX INFLOW (TUBING) ×2 IMPLANT
TUBING AQUILEX OUTFLOW (TUBING) ×2 IMPLANT

## 2018-01-24 NOTE — Transfer of Care (Signed)
Immediate Anesthesia Transfer of Care Note  Patient: Tammie Wade  Procedure(s) Performed: DILATATION & CURETTAGE/HYSTEROSCOPY , PAP SMEAR (N/A Vagina )  Patient Location: PACU  Anesthesia Type:General  Level of Consciousness: awake, alert  and oriented  Airway & Oxygen Therapy: Patient Spontanous Breathing and Patient connected to nasal cannula oxygen  Post-op Assessment: Report given to RN, Post -op Vital signs reviewed and stable and Patient moving all extremities X 4  Post vital signs: Reviewed and stable  Last Vitals:  Vitals Value Taken Time  BP    Temp    Pulse    Resp    SpO2      Last Pain:  Vitals:   01/24/18 0824  TempSrc: Oral      Patients Stated Pain Goal: 3 (40/81/44 8185)  Complications: No apparent anesthesia complications

## 2018-01-24 NOTE — H&P (Signed)
Tammie Wade is an 22 y.o. female with irregular bleeding that was evaluated with ultrasound and showed a likely hematometra.  Due to this finding hysteroscopy with cervical dilation and D&C was recommended.  Pt's mother and I have communicated about this as well as she is pt's POA.  Risks, benefits reviewed.  Possible office cervical dilation is an option for treatment but I do not think Tammie Wade would tolerate this very well and I think doing something like this in the office would be very traumatic for the pt..  We have decided to proceed with this in the OR.  Pertinent Gynecological History: Menses: light flow but irregular bleeding Bleeding: dysfunctional uterine bleeding Contraception: abstinence and OCP (estrogen/progesterone) DES exposure: denies Blood transfusions: yes, 4 units with prior cardiac surgery Sexually transmitted diseases: no past history Previous GYN Procedures: none  Last mammogram: none previously done due to young age Last pap: normal Date: 11/20/15 OB History: G0, P0   Menstrual History: Patient's last menstrual period was 01/11/2018 (exact date).    Past Medical History:  Diagnosis Date  . Abnormal uterine bleeding   . Blood transfusion without reported diagnosis    [facility] yrs at [facility] in [place] -4 units  . Bronchopulmonary dysplasia   . Chronic lung disease   . 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 COPD (chronic obstructive pulmonary disease) ([place])   . Emphysema lung ([place])   . Emphysema of lung ([place])   . 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 History of seizures   . Learning disabilities   . Meconium aspiration   . Prediabetes   . Thrombocytopenia ([place])     Past Surgical History:  Procedure Laterality Date  . 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 EYE SURGERY Bilateral    x 2 weak muscles  . PATENT DUCTUS ARTERIOUS REPAIR     newborn  . VSD REPAIR     newborn    Family History  Problem Relation Age of Onset  . Osteopenia Mother   . Hypertension Mother   . Diabetes Mother   . Breast cancer Maternal Aunt   . Cancer Maternal Aunt   . Hyperlipidemia Maternal Aunt   . Diabetes Maternal Aunt   . Lung cancer Maternal Grandmother   . Hyperlipidemia Maternal Grandmother   . Diabetes Maternal Grandmother   . COPD Maternal Grandmother   . Emphysema Maternal Grandmother   . Sickle cell anemia Maternal Grandmother   . Hyperlipidemia Maternal Grandfather   . Diabetes Maternal Grandfather   . Hypertension Father   . Hyperlipidemia Father   . Sickle cell anemia Maternal Uncle   . Rheum arthritis Cousin     Social History:  reports that she has never smoked. She has never used smokeless tobacco. She reports that she does not drink alcohol or use drugs.  Allergies: No Known Allergies  Medications Prior to Admission  Medication Sig Dispense Refill Last Dose  . Multiple Vitamin (MULTIVITAMIN WITH MINERALS) TABS tablet Take 2 tablets by mouth daily.    Past Week at Unknown time  . norgestrel-ethinyl estradiol (LOW-OGESTREL) 0.3-30 MG-MCG tablet Take 1 tablet by mouth daily. 84 tablet 4 01/24/2018 at Unknown time  . PROAIR HFA 108 (90 Base) MCG/ACT inhaler INHALE 2 PUFFS INTO THE LUNGS EVERY 6 HOURS AS NEEDED FOR WHEEZING OR SHORTNESS OF BREATH (Patient taking differently: Inhale  2 puffs into the lungs every 6 (six) hours as needed for wheezing or shortness of breath. ) 8.5 g 0 Past Month at Unknown time  . cetirizine (ZYRTEC ALLERGY) 10 MG tablet Take 1 tablet (10 mg total) by mouth daily. (Patient taking differently: Take 10 mg by mouth at bedtime. ) 90 tablet 3 More than a month at Unknown time  . SPIRIVA HANDIHALER 18 MCG inhalation capsule PLACE ONE CAPSULE INTO INHALER AND INHALE DAILY (Patient not taking: Reported on 01/12/2018) 30 capsule 2 Not Taking at Unknown time  . tiotropium (SPIRIVA HANDIHALER) 18 MCG inhalation capsule INHALE THE  CONTENTS OF 1 CAPSULE VIA INHALATION DEVICE EVERY DAY (Patient taking differently: Place 18 mcg into inhaler and inhale daily. INHALE THE CONTENTS OF 1 CAPSULE VIA INHALATION DEVICE EVERY DAY) 30 capsule 1 More than a month at Unknown time    Review of Systems  All other systems reviewed and are negative.   Blood pressure 109/64, pulse 72, temperature 98.6 F (37 C), temperature source Oral, resp. rate 16, last menstrual period 01/11/2018, SpO2 100 %. Physical Exam  Constitutional: She is oriented to person, place, and time. She appears well-developed and well-nourished.  Cardiovascular: Normal rate, regular rhythm and normal heart sounds.  Respiratory: Effort normal and breath sounds normal.  GI: Soft. Bowel sounds are normal.  Neurological: She is alert and oriented to person, place, and time.  Skin: Skin is warm and dry.  Psychiatric: She has a normal mood and affect.    Results for orders placed or performed during the hospital encounter of 01/24/18 (from the past 24 hour(s))  Pregnancy, urine     Status: None   Collection Time: 01/24/18  8:27 AM  Result Value Ref Range   Preg Test, Ur NEGATIVE NEGATIVE  Platelet count     Status: Abnormal   Collection Time: 01/24/18  8:35 AM  Result Value Ref Range   Platelets 101 (L) 150 - 400 K/uL    No results found.  Assessment/Plan: 22 yo G0 SAAF here for hysteroscopy with D&C due to irregular bleeding and ultrasound findings s/w hematometra.  Risks and benefits have been reviewed.  Pt here and ready to proceed.  Tammie Wade 01/24/2018, 9:53 AM

## 2018-01-24 NOTE — Discharge Instructions (Addendum)
Excuse from Work, [employer], or Physical Activity   _______________________________________________________ needs to be excused from: ____ Work ____ [employer] ____ Physical activity beginning now and through the following date: ________________. He or she may return to work or school but should still avoid the following physical activity or activities from now until ________________. Activity restrictions include: ____ Lifting more than _______ lb ____ Sitting longer than __________ minutes at a time ____ Standing longer than ________ minutes at a time ____ He or she may return to full physical activity as of ________________. Health Care Provider Name (printed): ________________________________________ Health Care Provider (signature): ___________________________________________ Date: ________________ This information is not intended to replace advice given to you by your health care provider. Make sure you discuss any questions you have with your health care provider. Document Released: 09/16/2000 Document Revised: 03/06/2016 Document Reviewed: 10/23/2013 Elsevier Interactive Patient Education  2018 [facility] Instructions  Activity: Get plenty of rest for the remainder of the day. A responsible individual must stay with you for 24 hours following the procedure.  For the next 24 hours, DO NOT: -Drive a car -Paediatric nurse -Drink alcoholic beverages -Take any medication unless instructed by your physician -Make any legal decisions or sign important papers.  Meals: Start with liquid foods such as gelatin or soup. Progress to regular foods as tolerated. Avoid greasy, spicy, heavy foods. If nausea and/or vomiting occur, drink only clear liquids until the nausea and/or vomiting subsides. Call your physician if vomiting continues.  Special Instructions/Symptoms: Your throat may feel dry or sore from the anesthesia or the breathing tube placed  in your throat during surgery. If this causes discomfort, gargle with warm salt water. The discomfort should disappear within 24 hours.  If you had a scopolamine patch placed behind your ear for the management of post- operative nausea and/or vomiting:  1. The medication in the patch is effective for 72 hours, after which it should be removed.  Wrap patch in a tissue and discard in the trash. Wash hands thoroughly with soap and water. 2. You may remove the patch earlier than 72 hours if you experience unpleasant side effects which may include dry mouth, dizziness or visual disturbances. 3. Avoid touching the patch. Wash your hands with soap and water after contact with the patch.   Post-surgical Instructions, Outpatient Surgery  You may expect to feel dizzy, weak, and drowsy for as long as 24 hours after receiving the medicine that made you sleep (anesthetic). For the first 24 hours after your surgery:    Do not drive a car, ride a bicycle, participate in physical activities, or take public transportation until you are done taking narcotic pain medicines or as directed by Dr. Sabra Heck.   Do not drink alcohol or take tranquilizers.   Do not take medicine that has not been prescribed by your physicians.   Do not sign important papers or make important decisions while on narcotic pain medicines.   Have a responsible person with you.   PAIN MANAGEMENT  Tylenol 595m 1-2 every six hours as needed  Topical lidocaine that can be applied around the urethra and vagina every six hours as needed for stinging  Vicodin 5/3289m  For more severe pain, take one or two tablets every four to six hours as needed for pain control.  (Remember that narcotic pain medications increase your risk of constipation.  If this becomes a problem, you may take an over the  counter stool softener like Colace 162m up to four times a day.)  Do not take tylenol if taking the pain medication as it contains tylenol  DO'S AND  DON'T'S  Do not take a tub bath for one week.  You may shower on the first day after your surgery  Do not do any heavy lifting for one to two weeks.  This increases the chance of bleeding.  Do move around as you feel able.  Stairs are fine.  You may begin to exercise again as you feel able.  Do not lift any weights for two weeks.  Do not put anything in the vagina for two weeks--no tampons, intercourse, or douching.    REGULAR MEDIATIONS/VITAMINS:  You may restart all of your regular medications as prescribed.  You may restart all of your vitamins as you normally take them.    PLEASE CALL OR SEEK MEDICAL CARE IF:  You have persistent nausea and vomiting.   You have trouble eating or drinking.   You have an oral temperature above 100.5.   You have constipation that is not helped by adjusting diet or increasing fluid intake. Pain medicines are a common cause of constipation.   You have heavy vaginal bleeding

## 2018-01-24 NOTE — Anesthesia Procedure Notes (Signed)
Procedure Name: LMA Insertion Date/Time: 01/24/2018 10:28 AM Performed by: Lidia Collum, MD Pre-anesthesia Checklist: Patient identified, Patient being monitored, Emergency Drugs available, Timeout performed and Suction available Patient Re-evaluated:Patient Re-evaluated prior to induction Oxygen Delivery Method: Circle System Utilized Preoxygenation: Pre-oxygenation with 100% oxygen Induction Type: IV induction Ventilation: Mask ventilation without difficulty LMA: LMA inserted and LMA with gastric port inserted LMA Size: 4.0 and 3.0 Number of attempts: 1 Placement Confirmation: positive ETCO2 and breath sounds checked- equal and bilateral

## 2018-01-24 NOTE — Anesthesia Preprocedure Evaluation (Addendum)
Anesthesia Evaluation  Patient identified by MRN, date of birth, ID band Patient awake    Reviewed: Allergy & Precautions, NPO status , Patient's Chart, lab work & pertinent test results  History of Anesthesia Complications Negative for: history of anesthetic complications  Airway Mallampati: III  TM Distance: >3 FB Neck ROM: Full    Dental no notable dental hx.    Pulmonary COPD,  COPD inhaler,  Bronchopulmonary dysplasia   Pulmonary exam normal        Cardiovascular Normal cardiovascular exam  S/p repair of congenital cardiac defect.  TTE May 2019:  Study Conclusions - Left ventricle: The cavity size was normal. Wall thickness was normal. Systolic function was mildly reduced. The estimated ejection fraction was in the range of 45% to 50%. Doppler parameters are consistent with abnormal left ventricular relaxation (grade 1 diastolic dysfunction). - Ventricular septum: Septal motion showed paradox. - Aortic valve: There was mild regurgitation directed eccentrically in the LVOT and along the septum. - Mitral valve: Moderately thickened leaflets . Mobility was restricted. 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 The findings are consistent with trivial stenosis. There was moderate regurgitation. Valve area by pressure half-time: 2.02 cm^2. - Left atrium: The atrium was moderately dilated. - Right ventricle: The cavity size was mildly dilated. Systolic function was mildly to moderately reduced. - Right atrium: The atrium was mildly dilated. - Atrial septum: No defect or patent foramen ovale was identified. - Pulmonary arteries: Systolic pressure was moderately increased. PA peak pressure: 59 mm Hg (S).  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 Mitral and tricuspid valve septal insertion is at the same level. 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   Neuro/Psych Seizures -,  Developmental delay negative psych ROS   GI/Hepatic Neg liver ROS, GERD  ,  Endo/Other  negative endocrine ROS  Renal/GU negative Renal ROS  negative genitourinary   Musculoskeletal negative musculoskeletal ROS (+)   Abdominal   Peds  (+) Congenital Heart Disease Hematology  (+) Blood dyscrasia (chronic thrombocytopenia), ,   Anesthesia Other Findings   Reproductive/Obstetrics negative OB ROS                            Anesthesia Physical Anesthesia Plan  ASA: III  Anesthesia Plan: General   Post-op Pain Management:    Induction: Intravenous  PONV Risk Score and Plan: 4 or greater and Ondansetron, Dexamethasone, Treatment may vary due to age or medical condition and Midazolam  Airway Management Planned: LMA  Additional Equipment: None  Intra-op Plan:   Post-operative Plan: Extubation in OR  Informed Consent: I have reviewed the patients History and Physical, chart, labs and discussed the procedure including the risks, benefits and alternatives for the proposed anesthesia with the patient or authorized representative who has indicated his/her understanding and acceptance.     Plan Discussed with:   Anesthesia Plan Comments:         Anesthesia Quick Evaluation

## 2018-01-24 NOTE — Op Note (Signed)
01/24/2018  11:30 AM  PATIENT:  [place] Antionette Behnken  22 y.o. female  PRE-OPERATIVE DIAGNOSIS:  Hematometra, irregular bleeding  POST-OPERATIVE DIAGNOSIS:  hematometra  PROCEDURE:  Procedure(s): DILATATION & CURETTAGE/HYSTEROSCOPY , PAP SMEAR  SURGEON:  Megan Salon  ASSISTANTS: OR staff   ANESTHESIA:   general, LMA  ESTIMATED BLOOD LOSS: 30 mL  BLOOD ADMINISTERED:none   FLUIDS: 1000cc LR  UOP: 50 cc drained with I&O cath at beginning of procedure  SPECIMEN:  Endometrial curetting  DISPOSITION OF SPECIMEN:  PATHOLOGY  FINDINGS: hematometra with old, dark appearing blood within endometrium, otherwise thin endometrium was noted  DESCRIPTION OF OPERATION: Patient was taken to the operating room.  She is placed in the supine position. SCDs were on her lower extremities and functioning properly. General anesthesia with an LMA was administered without difficulty. Dr. Kerin Perna, anesthesia, oversaw case.  Legs were then placed in the [place] in the low lithotomy position. The legs were lifted to the high lithotomy position and the Betadine prep was used on the inner thighs perineum and vagina x3. Patient was draped in a normal standard fashion. An in and out catheterization with a red rubber Foley catheter was performed. Approximately 50 cc of clear urine was noted. A bivalve speculum was placed the vagina. The anterior lip of the cervix was grasped with single-tooth tenaculum.  A paracervical block of 1% lidocaine mixed one-to-one with epinephrine (1:100,000 units).  10 cc was used total. The cervix is dilated up to #21 [facility] dilators. The endometrial cavity sounded to 8 cm.   A 2.9 millimeter diagnostic hysteroscope was obtained. Normal saline was used as a hysteroscopic fluid. The hysteroscope was advanced through the endocervical canal into the endometrial cavity. The tubal ostia was only noted on the left side.  There was dark old blood present that irrigated out easily with  the hysteroscopic fluid.  A #1 toothed curette was used to curette the cavity until rough gritty texture was noted in all quadrants. Scant tissue was obtained as the endometrium was thin.  The cavity was revisualized with the hysteroscope.  No additional findings were noted.  The hysteroscope was removed. The fluid deficit was 125 cc. The tenaculum was removed from the anterior lip of the cervix. The speculum was removed from the vagina.  There was some bleeding to the right of the vaginal where a hymenal laceration was present.  This was made hemostatic with silver nitrate.  1% lidocaine was instilled beneath this to help with any stinging from the silver nitrate.  Sponge, lap, needle, initially counts were correct x2. Patient was taken to recovery in stable condition.  COUNTS:  YES  PLAN OF CARE: Transfer to PACU

## 2018-01-24 NOTE — Anesthesia Postprocedure Evaluation (Signed)
Anesthesia Post Note  Patient: Tammie Wade  Procedure(s) Performed: DILATATION & CURETTAGE/HYSTEROSCOPY , PAP SMEAR (N/A Vagina )     Patient location during evaluation: PACU Anesthesia Type: General Level of consciousness: awake and alert Pain management: pain level controlled Vital Signs Assessment: post-procedure vital signs reviewed and stable Respiratory status: spontaneous breathing, nonlabored ventilation and respiratory function stable Cardiovascular status: blood pressure returned to baseline and stable Postop Assessment: no apparent nausea or vomiting Anesthetic complications: no    Last Vitals:  Vitals:   01/24/18 1215 01/24/18 1230  BP: 101/62 103/60  Pulse: 72 76  Resp: 20 19  Temp:  36.8 C  SpO2: 98% 97%    Last Pain:  Vitals:   01/24/18 1230  TempSrc:   PainSc: 0-No pain   Pain Goal: Patients Stated Pain Goal: 3 (01/24/18 1145)               Lidia Collum

## 2018-01-25 ENCOUNTER — Encounter (HOSPITAL_COMMUNITY): Payer: Self-pay | Admitting: Obstetrics & Gynecology

## 2018-01-25 LAB — CYTOLOGY - PAP: Diagnosis: NEGATIVE

## 2018-02-06 ENCOUNTER — Telehealth: Payer: Self-pay | Admitting: Family Medicine

## 2018-02-06 NOTE — Telephone Encounter (Signed)
P.A. SPIRIVA

## 2018-02-10 ENCOUNTER — Ambulatory Visit: Payer: 59 | Admitting: Obstetrics & Gynecology

## 2018-02-16 NOTE — Telephone Encounter (Signed)
Called [employer] t# [phone] & pt does not have prescription coverage thru them, only medical.  Left message for pt, need to find out if she still uses the Spiriva and if it is capsules and who her Rx benefits are thru

## 2018-02-17 ENCOUNTER — Encounter: Payer: Self-pay | Admitting: Obstetrics & Gynecology

## 2018-02-17 ENCOUNTER — Ambulatory Visit: Payer: 59 | Admitting: Obstetrics & Gynecology

## 2018-02-17 ENCOUNTER — Ambulatory Visit (INDEPENDENT_AMBULATORY_CARE_PROVIDER_SITE_OTHER): Payer: 59 | Admitting: Obstetrics & Gynecology

## 2018-02-17 VITALS — BP 122/76 | HR 92 | Resp 16 | Ht 64.0 in | Wt 128.0 lb

## 2018-02-17 DIAGNOSIS — N857 Hematometra: Secondary | ICD-10-CM | POA: Diagnosis not present

## 2018-02-17 DIAGNOSIS — N926 Irregular menstruation, unspecified: Secondary | ICD-10-CM | POA: Diagnosis not present

## 2018-02-17 NOTE — Progress Notes (Deleted)
Post Operative Visit  Procedure: Dilatation and Curettage/Hysteroscopy, Pap Smear.  Days Post-op: 24  Subjective: ***  Objective: There were no vitals taken for this visit.  EXAM General: {Exam; general:16600} Resp: {Exam; lung:16931} Cardio: {Exam; heart:5510} GI: {Exam, AC:4584835} Extremities: {Exam; extremity:5109} Vaginal Bleeding: {exam; vaginal bleeding:3041122}  Assessment: s/p ***  Plan: Recheck {NUMBER 1-10:22536} weeks ***

## 2018-02-17 NOTE — Progress Notes (Signed)
Post Operative Visit  Procedure: Dilatation and Curettage/ Hysteroscopy, Pap Smear.  Days Post-op: 24  Subjective: Doing well.  Came from work today.  Had hysteroscopy due to hematometra.  She did have some post op cramping and did take some medication for this.  She missed about a week from work.    Last cycle started 10/31 and lasted 7 days.  She started her pills when the cycle started.  She hasn't had any spotting thus far this month.    Objective: BP 122/76 (BP Location: Right Arm, Patient Position: Sitting, Cuff Size: Normal)   Pulse 92   Resp 16   Ht _0  (1.626 m)   Wt 128 lb (58.1 kg)   LMP 02/03/2018 (Exact Date)   BMI 21.97 kg/m   EXAM  General: alert and no distress Resp: clear to auscultation bilaterally Cardio: regular rate and rhythm, S1, S2 normal, no murmur, click, rub or gallop GI: soft, non-tender; bowel sounds normal; no masses,  no organomegaly Extremities: extremities normal, atraumatic, no cyanosis or edema Vaginal Bleeding: none  Assessment: s/p hysteroscopy with D&C  Plan: Recheck with AEX in 2020. Pt will keep menstrual calendar for the future and always try to bring OCPs with her when she comes so I know where she is in her pack.

## 2018-02-28 NOTE — Telephone Encounter (Signed)
Called pt and she states she has Medicaid not [employer]BS  And doesn't need refill at this time, she will let us know if she has any issues getting this filled

## 2018-03-20 ENCOUNTER — Other Ambulatory Visit: Payer: Self-pay | Admitting: Family Medicine

## 2018-03-21 ENCOUNTER — Telehealth: Payer: Self-pay | Admitting: Family Medicine

## 2018-03-21 MED ORDER — UMECLIDINIUM BROMIDE 62.5 MCG/INH IN AEPB: 1.0000 | INHALATION_SPRAY | Freq: Every day | RESPIRATORY_TRACT | 1 refills | Status: DC

## 2018-03-21 NOTE — Telephone Encounter (Signed)
[facility] faxed req that drug not covered and the preferred alternative is INCRUSEELL/PTA

## 2018-03-21 NOTE — Telephone Encounter (Signed)
Switched med to [employer] per vickie

## 2018-03-21 NOTE — Telephone Encounter (Signed)
She has used tiotropium inhaled (HandiHaler) in the past. We can switch her to this if ok. 18 mcg per cap. 2 puffs (1 cap) inhaled daily.

## 2018-04-28 ENCOUNTER — Emergency Department (HOSPITAL_COMMUNITY)
Admission: EM | Admit: 2018-04-28 | Discharge: 2018-04-28 | Disposition: A | Discharge status: Home / Self Care | Payer: BLUE CROSS/BLUE SHIELD | Attending: Emergency Medicine | Admitting: Emergency Medicine

## 2018-04-28 ENCOUNTER — Emergency Department (HOSPITAL_COMMUNITY): Payer: BLUE CROSS/BLUE SHIELD

## 2018-04-28 ENCOUNTER — Other Ambulatory Visit: Payer: Self-pay

## 2018-04-28 ENCOUNTER — Encounter (HOSPITAL_COMMUNITY): Payer: Self-pay

## 2018-04-28 DIAGNOSIS — Z79899 Other long term (current) drug therapy: Secondary | ICD-10-CM | POA: Insufficient documentation

## 2018-04-28 DIAGNOSIS — J441 Chronic obstructive pulmonary disease with (acute) exacerbation: Secondary | ICD-10-CM | POA: Insufficient documentation

## 2018-04-28 DIAGNOSIS — R0602 Shortness of breath: Secondary | ICD-10-CM | POA: Diagnosis present

## 2018-04-28 HISTORY — DX: Atrial septal defect, unspecified: Q21.10

## 2018-04-28 HISTORY — DX: Personal history of (corrected) congenital malformations of heart and circulatory system: Z87.74

## 2018-04-28 HISTORY — DX: Atrial septal defect: Q21.1

## 2018-04-28 HISTORY — DX: Ventricular septal defect: Q21.0

## 2018-04-28 LAB — CBC WITH DIFFERENTIAL/PLATELET
Abs Immature Granulocytes: 0.02 10*3/uL (ref 0.00–0.07)
BASOS ABS: 0 10*3/uL (ref 0.0–0.1)
Basophils Relative: 0 %
EOS PCT: 1 %
Eosinophils Absolute: 0 10*3/uL (ref 0.0–0.5)
HCT: 29.3 % — ABNORMAL LOW (ref 36.0–46.0)
HEMOGLOBIN: 8.3 g/dL — AB (ref 12.0–15.0)
Immature Granulocytes: 0 %
LYMPHS PCT: 26 %
Lymphs Abs: 1.5 10*3/uL (ref 0.7–4.0)
MCH: 22.3 pg — ABNORMAL LOW (ref 26.0–34.0)
MCHC: 28.3 g/dL — AB (ref 30.0–36.0)
MCV: 78.8 fL — ABNORMAL LOW (ref 80.0–100.0)
Monocytes Absolute: 0.7 10*3/uL (ref 0.1–1.0)
Monocytes Relative: 12 %
NRBC: 0 % (ref 0.0–0.2)
Neutro Abs: 3.6 10*3/uL (ref 1.7–7.7)
Neutrophils Relative %: 61 %
PLATELETS: 117 10*3/uL — AB (ref 150–400)
RBC: 3.72 MIL/uL — AB (ref 3.87–5.11)
RDW: 15.6 % — ABNORMAL HIGH (ref 11.5–15.5)
WBC: 5.8 10*3/uL (ref 4.0–10.5)

## 2018-04-28 LAB — BASIC METABOLIC PANEL
ANION GAP: 10 (ref 5–15)
BUN: 12 mg/dL (ref 6–20)
CO2: 19 mmol/L — AB (ref 22–32)
Calcium: 8.9 mg/dL (ref 8.9–10.3)
Chloride: 108 mmol/L (ref 98–111)
Creatinine, Ser: 0.56 mg/dL (ref 0.44–1.00)
GFR calc Af Amer: >60 mL/min (ref >60)
GFR calc non Af Amer: >60 mL/min (ref >60)
GLUCOSE: 91 mg/dL (ref 70–99)
POTASSIUM: 4.1 mmol/L (ref 3.5–5.1)
Sodium: 137 mmol/L (ref 135–145)

## 2018-04-28 LAB — D-DIMER, QUANTITATIVE: D-Dimer, Quant: 1.28 ug/mL-FEU — ABNORMAL HIGH (ref 0.00–0.50)

## 2018-04-28 LAB — TROPONIN I: Troponin I: <0.03 ng/mL (ref <0.03)

## 2018-04-28 LAB — I-STAT BETA HCG BLOOD, ED (MC, WL, AP ONLY)

## 2018-04-28 MED ORDER — IPRATROPIUM-ALBUTEROL 0.5-2.5 (3) MG/3ML IN SOLN
3.0000 mL | Freq: Once | RESPIRATORY_TRACT | Status: AC
  Administered 2018-04-28: 3 mL via RESPIRATORY_TRACT
  Filled 2018-04-28: qty 3

## 2018-04-28 MED ORDER — DOXYCYCLINE HYCLATE 100 MG PO TABS: 100.0000 mg | ORAL_TABLET | Freq: Two times a day (BID) | ORAL | 0 refills | Status: DC

## 2018-04-28 MED ORDER — PREDNISONE 20 MG PO TABS: 40.0000 mg | ORAL_TABLET | Freq: Every day | ORAL | 0 refills | Status: DC

## 2018-04-28 MED ORDER — DOXYCYCLINE HYCLATE 100 MG PO TABS
100.0000 mg | ORAL_TABLET | Freq: Once | ORAL | Status: AC
  Administered 2018-04-28: 100 mg via ORAL
  Filled 2018-04-28: qty 1

## 2018-04-28 MED ORDER — IOPAMIDOL (ISOVUE-370) INJECTION 76%
INTRAVENOUS | Status: AC
  Administered 2018-04-28: 100 mL
  Filled 2018-04-28: qty 100

## 2018-04-28 MED ORDER — ALBUTEROL SULFATE HFA 108 (90 BASE) MCG/ACT IN AERS
4.0000 | INHALATION_SPRAY | RESPIRATORY_TRACT | Status: AC
  Administered 2018-04-28: 4 via RESPIRATORY_TRACT
  Filled 2018-04-28: qty 6.7

## 2018-04-28 MED ORDER — PREDNISONE 20 MG PO TABS
60.0000 mg | ORAL_TABLET | Freq: Once | ORAL | Status: AC
  Administered 2018-04-28: 60 mg via ORAL
  Filled 2018-04-28: qty 3

## 2018-04-28 MED ORDER — ALBUTEROL SULFATE (2.5 MG/3ML) 0.083% IN NEBU
2.5000 mg | INHALATION_SOLUTION | Freq: Once | RESPIRATORY_TRACT | Status: AC
  Administered 2018-04-28: 2.5 mg via RESPIRATORY_TRACT
  Filled 2018-04-28: qty 3

## 2018-04-28 MED ORDER — SODIUM CHLORIDE (PF) 0.9 % IJ SOLN
INTRAMUSCULAR | Status: AC
  Filled 2018-04-28: qty 50

## 2018-04-28 NOTE — ED Triage Notes (Signed)
Patient states she was taking Spiriva and was switched to Incruse. Patient states that she feels like she is not breathing adequately.

## 2018-04-28 NOTE — Discharge Instructions (Addendum)
Take the prescriptions as directed.  Use your albuterol inhaler (2 to 4 puffs) every 4 hours for the next 7 days, then as needed for cough, wheezing, or shortness of breath.  Call your regular medical doctor tomorrow morning to schedule a follow up appointment within the next 2 days.  Return to the Emergency Department immediately sooner if worsening.

## 2018-04-28 NOTE — ED Provider Notes (Signed)
[facility] DEPT Provider Note   CSN: [id number] Arrival date & time: 04/28/18  1355     History   Chief Complaint Chief Complaint  Patient presents with  . Shortness of Breath    HPI Tammie Wade is a 23 y.o. female.  HPI  Pt was seen at 1735.  Per pt, c/o gradual onset and worsening of persistent cough, wheezing and SOB for the past 3 weeks. Describes her symptoms as "my lung diseases." Pt states she was changed from Spiriva to Incruse before her symptoms began. Has been associated with constant generalized chest "pains" for he past 3 to 4 days. Pt is unsure if she has albuterol MDI at home. Denies palpitations, no back pain, no abd pain, no N/V/D, no fevers, no rash.    Past Medical History:  Diagnosis Date  . Abnormal uterine bleeding   . ASD (atrial septal defect)   . Blood transfusion without reported diagnosis    13 yrs at [facility] in [place] -4 units  . Bronchopulmonary dysplasia   . Chronic lung disease   . 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 COPD (chronic obstructive pulmonary disease) ([place])   . Emphysema of lung ([place])   . 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 History of seizures   . Learning disabilities   . Meconium aspiration   . Prediabetes   . S/P repair of PDA   . Thrombocytopenia ([place])   . VSD (ventricular septal defect)     Patient Active Problem List   Diagnosis Date Noted  . S/P PDA repair 01/02/2018  . Scoliosis 01/02/2018  . VSD (ventricular septal defect) 08/26/2017  . PDA (patent ductus arteriosus) 08/26/2017  . Family history of tuberous sclerosis 09/23/2016  . 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 COPD, severe ([place]) 07/12/2015  . Emphysema lung ([place]) 07/12/2015  . GERD (gastroesophageal reflux disease) 07/12/2015  . 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 Asthma, mild intermittent 01/02/2004  . 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 Delay in development  06/22/2003  . Difficulty hearing 06/22/2003  . ASD (atrial septal defect), ostium secundum 01/06/1994    Past Surgical History:  Procedure Laterality Date  . 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 DILATATION & CURETTAGE/HYSTEROSCOPY WITH MYOSURE N/A 01/24/2018   Procedure: DILATATION & CURETTAGE/HYSTEROSCOPY , PAP SMEAR;  Surgeon: Megan Salon, MD;  Location: [facility];  Service: Gynecology;  Laterality: N/A;  Possible Myosure  . EYE SURGERY Bilateral    x 2 weak muscles  . PATENT DUCTUS ARTERIOUS REPAIR     newborn  . VSD REPAIR     newborn     OB History    Gravida  0   Para  0   Term  0   Preterm  0   AB  0   Living  0     SAB  0   TAB  0   Ectopic  0   Multiple  0   Live Births               Home Medications    Prior to Admission medications   Medication Sig Start Date End Date Taking? Authorizing Provider  cetirizine (ZYRTEC ALLERGY) 10 MG tablet Take 1 tablet (10 mg total) by mouth daily. Patient taking differently: Take 10 mg by mouth at bedtime.  08/23/15   Raenette Rover,  Vickie L, NP-C  Multiple Vitamin (MULTIVITAMIN WITH MINERALS) TABS tablet Take 2 tablets by mouth daily.     [provider]  norgestrel-ethinyl estradiol (LOW-OGESTREL) 0.3-30 MG-MCG tablet Take 1 tablet by mouth daily. 08/10/17   Megan Salon, MD  PROAIR HFA 108 [phone] Base) MCG/ACT inhaler INHALE 2 PUFFS INTO THE LUNGS EVERY 6 HOURS AS NEEDED FOR WHEEZING OR SHORTNESS OF BREATH Patient taking differently: Inhale 2 puffs into the lungs every 6 (six) hours as needed for wheezing or shortness of breath.  11/20/16   Henson, Vickie L, NP-C  SPIRIVA HANDIHALER 18 MCG inhalation capsule PLACE ONE CAPSULE INTO INHALER AND INHALE DAILY 11/18/16   Henson, Vickie L, NP-C  tiotropium (SPIRIVA HANDIHALER) 18 MCG inhalation capsule INHALE THE CONTENTS OF 1 CAPSULE VIA INHALATION DEVICE EVERY DAY 03/21/18   Henson, Vickie L, NP-C  umeclidinium  bromide (INCRUSE ELLIPTA) 62.5 MCG/INH AEPB Inhale 1 puff into the lungs daily. 03/21/18   [name], NP-C    Family History Family History  Problem Relation Age of Onset  . Osteopenia Mother   . Hypertension Mother   . Diabetes Mother   . Breast cancer Maternal Aunt   . Cancer Maternal Aunt   . Hyperlipidemia Maternal Aunt   . Diabetes Maternal Aunt   . Lung cancer Maternal Grandmother   . Hyperlipidemia Maternal Grandmother   . Diabetes Maternal Grandmother   . COPD Maternal Grandmother   . Emphysema Maternal Grandmother   . Sickle cell anemia Maternal Grandmother   . Hyperlipidemia Maternal Grandfather   . Diabetes Maternal Grandfather   . Hypertension Father   . Hyperlipidemia Father   . Sickle cell anemia Maternal Uncle   . Rheum arthritis Cousin     Social History Social History   Tobacco Use  . Smoking status: Never Smoker  . Smokeless tobacco: Never Used  Substance Use Topics  . Alcohol use: No    Alcohol/week: 0.0 standard drinks  . Drug use: No     Allergies   Patient has no known allergies.   Review of Systems Review of Systems ROS: Statement: All systems negative except as marked or noted in the HPI; Constitutional: Negative for fever and chills. ; ; Eyes: Negative for eye pain, redness and discharge. ; ; ENMT: Negative for ear pain, hoarseness, nasal congestion, sinus pressure and sore throat. ; ; Cardiovascular: Negative for palpitations, diaphoresis, and peripheral edema. ; ; Respiratory: +cough, wheezing, SOB, CP. Negative for stridor. ; ; Gastrointestinal: Negative for nausea, vomiting, diarrhea, abdominal pain, blood in stool, hematemesis, jaundice and rectal bleeding. . ; ; Genitourinary: Negative for dysuria, flank pain and hematuria. ; ; Musculoskeletal: Negative for back pain and neck pain. Negative for swelling and trauma.; ; Skin: Negative for pruritus, rash, abrasions, blisters, bruising and skin lesion.; ; Neuro: Negative for headache,  lightheadedness and neck stiffness. Negative for weakness, altered level of consciousness, altered mental status, extremity weakness, paresthesias, involuntary movement, seizure and syncope.       Physical Exam Updated Vital Signs BP 137/79 (BP Location: Left Arm)   Pulse 76   Temp 98.6 F (37 C) (Oral)   Resp 14   Ht _0  (1.626 m)   Wt 54 kg   LMP 04/19/2018   SpO2 100%   BMI 20.45 kg/m   Physical Exam 1740: Physical examination:  Nursing notes reviewed; Vital signs and O2 SAT reviewed;  Constitutional: Well developed, Well nourished, Well hydrated, In no acute distress; Head:  Normocephalic, atraumatic;  Eyes: EOMI, PERRL, No scleral icterus; ENMT: Mouth and pharynx normal, Mucous membranes moist; Neck: Supple, Full range of motion, No lymphadenopathy; Cardiovascular: Regular rate and rhythm, No gallop; Respiratory: Breath sounds coarse & equal bilaterally, No wheezes.  Speaking full sentences with ease, Normal respiratory effort/excursion; Chest: Nontender, Movement normal; Abdomen: Soft, Nontender, Nondistended, Normal bowel sounds; Genitourinary: No CVA tenderness; Extremities: Peripheral pulses normal, No tenderness, No edema, No calf edema or asymmetry.; Neuro: AA&Ox3, Major CN grossly intact.  Speech clear. No gross focal motor or sensory deficits in extremities.; Skin: Color normal, Warm, Dry.   ED Treatments / Results  Labs (all labs ordered are listed, but only abnormal results are displayed)   EKG EKG Interpretation  Date/Time:  Thursday April 28 2018 19:59:06 EST Ventricular Rate:  79 PR Interval:    QRS Duration: 109 QT Interval:  396 QTC Calculation: 454 R Axis:   99 Text Interpretation:  Sinus rhythm Ventricular trigeminy Borderline right axis deviation Borderline Q waves in inferior leads ST elev, probable normal early repol pattern When compared with ECG of 07/01/2017 No significant change was found Confirmed by Francine Graven [phone]) on 04/28/2018 8:30:31  PM   Radiology   Procedures Procedures (including critical care time)  Medications Ordered in ED Medications  predniSONE (DELTASONE) tablet 60 mg (60 mg Oral Given 04/28/18 1801)  ipratropium-albuterol (DUONEB) 0.5-2.5 (3) MG/3ML nebulizer solution 3 mL (3 mLs Nebulization Given 04/28/18 1801)  albuterol (PROVENTIL) (2.5 MG/3ML) 0.083% nebulizer solution 2.5 mg (2.5 mg Nebulization Given 04/28/18 1801)     Initial Impression / Assessment and Plan / ED Course  I have reviewed the triage vital signs and the nursing notes.  Pertinent labs & imaging results that were available during my care of the patient were reviewed by me and considered in my medical decision making (see chart for details).  MDM Reviewed: previous chart, vitals and nursing note Reviewed previous: labs and ECG Interpretation: labs, ECG and x-ray   Results for orders placed or performed during the hospital encounter of 29/47/65  Basic metabolic panel  Result Value Ref Range   Sodium 137 135 - 145 mmol/L   Potassium 4.1 3.5 - 5.1 mmol/L   Chloride 108 98 - 111 mmol/L   CO2 19 (L) 22 - 32 mmol/L   Glucose, Bld 91 70 - 99 mg/dL   BUN 12 6 - 20 mg/dL   Creatinine, Ser 0.56 0.44 - 1.00 mg/dL   Calcium 8.9 8.9 - 10.3 mg/dL   GFR calc non Af Amer >60 >60 mL/min   GFR calc Af Amer >60 >60 mL/min   Anion gap 10 5 - 15  Troponin I - Once  Result Value Ref Range   Troponin I <0.03 <0.03 ng/mL  D-dimer, quantitative  Result Value Ref Range   D-Dimer, Quant 1.28 (H) 0.00 - 0.50 ug/mL-FEU  CBC with Differential  Result Value Ref Range   WBC 5.8 4.0 - 10.5 K/uL   RBC 3.72 (L) 3.87 - 5.11 MIL/uL   Hemoglobin 8.3 (L) 12.0 - 15.0 g/dL   HCT 29.3 (L) 36.0 - 46.0 %   MCV 78.8 (L) 80.0 - 100.0 fL   MCH 22.3 (L) 26.0 - 34.0 pg   MCHC 28.3 (L) 30.0 - 36.0 g/dL   RDW 15.6 (H) 11.5 - 15.5 %   Platelets 117 (L) 150 - 400 K/uL   nRBC 0.0 0.0 - 0.2 %   Neutrophils Relative % 61 %   Neutro Abs 3.6 1.7 - 7.7 K/uL  Lymphocytes Relative 26 %   Lymphs Abs 1.5 0.7 - 4.0 K/uL   Monocytes Relative 12 %   Monocytes Absolute 0.7 0.1 - 1.0 K/uL   Eosinophils Relative 1 %   Eosinophils Absolute 0.0 0.0 - 0.5 K/uL   Basophils Relative 0 %   Basophils Absolute 0.0 0.0 - 0.1 K/uL   Immature Granulocytes 0 %   Abs Immature Granulocytes 0.02 0.00 - 0.07 K/uL  I-Stat beta hCG blood, ED  Result Value Ref Range   I-stat hCG, quantitative <5.0 <5 mIU/mL   Comment 3           Dg Chest 2 View Result Date: 04/28/2018 CLINICAL DATA:  Cough EXAM: CHEST - 2 VIEW COMPARISON:  01/05/2016 FINDINGS: Hyperinflation with diffuse bilateral hyper lucency. Chronic pleural thickening at the CP angles. Postsurgical changes of the mediastinum. Stable cardiomediastinal silhouette with enlarged pulmonary conus. No pneumothorax. IMPRESSION: No active cardiopulmonary disease. Similar appearance of cardiomegaly and hyperinflation. Electronically Signed   By: Donavan Foil M.D.   On: 04/28/2018 18:19    Ct Angio Chest Pe W/cm &/or Wo Cm Result Date: 04/28/2018 CLINICAL DATA:  Shortness of breath. History of chronic lung disease, emphysema and pediatric heart surgery. EXAM: CT ANGIOGRAPHY CHEST WITH CONTRAST TECHNIQUE: Multidetector CT imaging of the chest was performed using the standard protocol during bolus administration of intravenous contrast. Multiplanar CT image reconstructions and MIPs were obtained to evaluate the vascular anatomy. CONTRAST:  179m ISOVUE-370 IOPAMIDOL (ISOVUE-370) INJECTION 76% COMPARISON:  Chest radiograph April 28, 2018 and CT chest July 23, 2015. FINDINGS: CARDIOVASCULAR: Adequate contrast opacification of the pulmonary artery's. Similarly enlarged main pulmonary artery at 4.9 cm. No pulmonary arterial filling defects to level of the subsegmental branches. The heart is moderately enlarged with LEFT ventricle thinning and enlarged RIGHT ventricle. Abnormal configuration of aortic root, incompletely assessed.  MEDIASTINUM/NODES: No lymphadenopathy by CT size criteria. LUNGS/PLEURA: Tracheobronchial tree is patent, no pneumothorax. Similar hypoplastic and segmented lower lobes with overall decreased parenchymal densities, unchanged. No pleural effusion or focal consolidation. 3 mm RIGHT upper lobe pulmonary nodule, no routine indicated follow-up given patient's age of less than 89years. UPPER ABDOMEN: Non-acute. MUSCULOSKELETAL: Non-acute.  Remote median sternotomy. Review of the MIP images confirms the above findings. IMPRESSION: 1. No acute pulmonary embolism. Chronically enlarged main pulmonary artery seen with pulmonary arterial hypertension the there may be a developmental component. 2. Stable emphysematous changes and chronic interstitial changes. 3. Cardiomegaly and abnormal appearance of aortic root; findings of congenital heart disease. Electronically Signed   By: CElon AlasM.D.   On: 04/28/2018 22:50    2300:  Pt states she "feels better" after neb and steroid.  NAD, lungs CTA bilat, no wheezing, resps easy, speaking full sentences, Sats 100% R/A. Pt's mother would like to take her home now. Doubt PE as cause for symptoms with negative CTA chest.  Doubt ACS as cause for symptoms with normal troponin and unchanged EKG from previous after 3 to 4 days of constant atypical symptoms.  Dx and testing d/w pt and family.  Questions answered.  Verb understanding, agreeable to d/c home with outpt f/u.        Final Clinical Impressions(s) / ED Diagnoses   Final diagnoses:  None    ED Discharge Orders    None       MFrancine Graven DO 05/02/18 1338

## 2018-04-28 NOTE — ED Notes (Signed)
Pt CT

## 2018-05-18 ENCOUNTER — Ambulatory Visit (INDEPENDENT_AMBULATORY_CARE_PROVIDER_SITE_OTHER): Payer: Medicaid Other | Admitting: Physician Assistant

## 2018-05-18 ENCOUNTER — Encounter: Payer: Self-pay | Admitting: Physician Assistant

## 2018-05-18 VITALS — BP 118/64 | HR 80 | Ht 66.0 in | Wt 124.1 lb

## 2018-05-18 DIAGNOSIS — R9431 Abnormal electrocardiogram [ECG] [EKG]: Secondary | ICD-10-CM | POA: Diagnosis not present

## 2018-05-18 DIAGNOSIS — Q210 Ventricular septal defect: Secondary | ICD-10-CM | POA: Diagnosis not present

## 2018-05-18 DIAGNOSIS — Q250 Patent ductus arteriosus: Secondary | ICD-10-CM

## 2018-05-18 DIAGNOSIS — D508 Other iron deficiency anemias: Secondary | ICD-10-CM

## 2018-05-18 DIAGNOSIS — R0600 Dyspnea, unspecified: Secondary | ICD-10-CM | POA: Diagnosis not present

## 2018-05-18 NOTE — Patient Instructions (Addendum)
Medication Instructions:   Your physician recommends that you continue on your current medications as directed. Please refer to the Current Medication list given to you today.   If you need a refill on your cardiac medications before your next appointment, please call your pharmacy.   Lab work: CBC TODAY    If you have labs (blood work) drawn today and your tests are completely normal, you will receive your results only by: Marland Kitchen MyChart Message (if you have MyChart) OR . A paper copy in the mail If you have any lab test that is abnormal or we need to change your treatment, we will call you to review the results.  Testing/Procedures: Your physician has requested that you have an echocardiogram. Echocardiography is a painless test that uses sound waves to create images of your heart. It provides your doctor with information about the size and shape of your heart and how well your heart's chambers and valves are working. This procedure takes approximately one hour. There are no restrictions for this procedure.   Follow-Up:  IN 2 WEEKS WITH DR [name] OR AVAILABLE APP ON DAY DR [name] IN OFFICE    Any Other Special Instructions Will Be Listed Below (If Applicable).

## 2018-05-18 NOTE — Progress Notes (Signed)
Cardiology Office Note    Date:  05/18/2018   ID:  Tammie Wade, DOB [birth year], MRN [id number]  PCP:  [name], NP-C  Cardiologist:  Dr. [name]   Chief Complaint:ER  follow up  History of Present Illness:   Tammie Wade is a 23 y.o. 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   Seen by Dr. [name] 08/2017 for abnormal EKG with anterior TWI.  Doubt ischemia given her age.  Likely related to prior heart surgery. Echo with LVEF of 45-50%, grade 1 DD, mild AI.   Seen in ER 04/28/18 for dyspnea. Felt better after nebulizer and steroids. CTA of chest negative for PE however showed pulmonary artery hypertension, stable emphysematous changes.   Here today for follow up. She has chronic dyspnea due to COPD/Emphysema. On Spiriva. However recently worsen, seen in ER as above. She also reports intermittent orthopnea, DOE and exertional chest discomfort. No LE edema, melena, palpitations or syncope. Sometime does feels extra beats.    Past Medical History:  Diagnosis Date  . Abnormal uterine bleeding   . ASD (atrial septal defect)   . Blood transfusion without reported diagnosis    13 yrs at [facility] in [place] -4 units  . Bronchopulmonary dysplasia   . Chronic lung disease   . Congenital thrombocytopenia ([place])   . COPD (chronic obstructive pulmonary disease) ([place])   . Emphysema lung ([place])   . Emphysema of lung ([place])   . 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 History of seizures   . Learning disabilities   . Meconium aspiration   . Prediabetes   . S/P repair of PDA   . Thrombocytopenia ([place])   . VSD (ventricular septal defect)     Past Surgical History:  Procedure Laterality Date  . 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 DILATATION & CURETTAGE/HYSTEROSCOPY WITH MYOSURE N/A 01/24/2018   Procedure: DILATATION &  CURETTAGE/HYSTEROSCOPY , PAP SMEAR;  Surgeon: Megan Salon, MD;  Location: [facility];  Service: Gynecology;  Laterality: N/A;  Possible Myosure  . EYE SURGERY Bilateral    x 2 weak muscles  . PATENT DUCTUS ARTERIOUS REPAIR     newborn  . VSD REPAIR     newborn    Current Medications: Prior to Admission medications   Medication Sig Start Date End Date Taking? Authorizing Provider  cetirizine (ZYRTEC ALLERGY) 10 MG tablet Take 1 tablet (10 mg total) by mouth daily. Patient not taking: Reported on 04/28/2018 08/23/15   Harland Dingwall L, NP-C  doxycycline (VIBRA-TABS) 100 MG tablet Take 1 tablet (100 mg total) by mouth 2 (two) times daily. 04/28/18   Francine Graven, DO  Multiple Vitamin (MULTIVITAMIN WITH MINERALS) TABS tablet Take 1 tablet by mouth daily.     [provider]  norgestrel-ethinyl estradiol (LOW-OGESTREL) 0.3-30 MG-MCG tablet Take 1 tablet by mouth daily. 08/10/17   Megan Salon, MD  predniSONE (DELTASONE) 20 MG tablet Take 2 tablets (40 mg total) by mouth daily. 04/28/18   Francine Graven, DO  PROAIR HFA 108 (90 Base) MCG/ACT inhaler INHALE 2 PUFFS INTO THE LUNGS EVERY 6 HOURS AS NEEDED FOR WHEEZING OR SHORTNESS OF BREATH Patient taking differently: Inhale 2 puffs into the lungs every 6 (six) hours as needed for wheezing or shortness of breath.  11/20/16   Henson, Vickie L, NP-C  SPIRIVA HANDIHALER 18 MCG inhalation capsule PLACE ONE CAPSULE INTO INHALER AND INHALE DAILY Patient not taking: Reported on 04/28/2018 11/18/16   Harland Dingwall L, NP-C  tiotropium (SPIRIVA HANDIHALER) 18 MCG inhalation capsule INHALE THE CONTENTS OF 1 CAPSULE VIA INHALATION DEVICE EVERY DAY Patient not taking: Reported on 04/28/2018 03/21/18   Harland Dingwall L, NP-C  umeclidinium bromide (INCRUSE ELLIPTA) 62.5 MCG/INH AEPB Inhale 1 puff into the lungs daily. 03/21/18   [name], NP-C    Allergies:   Patient has no known allergies.   Social History   Socioeconomic History  . Marital  status: Single    Spouse name: Not on file  . Number of children: Not on file  . Years of education: Not on file  . Highest education level: Not on file  Occupational History  . Not on file  Social Needs  . Financial resource strain: Not on file  . Food insecurity:    Worry: Not on file    Inability: Not on file  . Transportation needs:    Medical: Not on file    Non-medical: Not on file  Tobacco Use  . Smoking status: Never Smoker  . Smokeless tobacco: Never Used  Substance and Sexual Activity  . Alcohol use: No    Alcohol/week: 0.0 standard drinks  . Drug use: No  . Sexual activity: Never    Birth control/protection: Pill, Abstinence  Lifestyle  . Physical activity:    Days per week: Not on file    Minutes per session: Not on file  . Stress: Not on file  Relationships  . Social connections:    Talks on phone: Not on file    Gets together: Not on file    Attends religious service: Not on file    Active member of club or organization: Not on file    Attends meetings of clubs or organizations: Not on file    Relationship status: Not on file  Other Topics Concern  . Not on file  Social History Narrative   Originally from [place]. Moved to [place] from [facility] in 2016. Has also lived in [place]. No pets currently. No bird or hot tub exposure. Remotely had problems with mold in an apartment from [place] until 2007 in a bathroom. Does have carpet. Enjoys playing on her phone.      Family History:  The patient's family history includes Breast cancer in her maternal aunt; COPD in her maternal grandmother; Cancer in her maternal aunt; Diabetes in her maternal aunt, maternal grandfather, maternal grandmother, and mother; Emphysema in her maternal grandmother; Hyperlipidemia in her father, maternal aunt, maternal grandfather, and maternal grandmother; Hypertension in her father and mother; Lung cancer in her maternal grandmother; Osteopenia in her mother; Rheum arthritis in her cousin; Sickle cell anemia  in her maternal grandmother and maternal uncle.   ROS:   Please see the history of present illness.    ROS All other systems reviewed and are negative.   PHYSICAL EXAM:   VS:  BP 118/64   Pulse 80   Ht _0  (1.676 m)   Wt 124 lb 1.9 oz (56.3 kg)   LMP 04/19/2018   SpO2 99%   BMI 20.03 kg/m    GEN: Well nourished, well developed, in no acute distress  HEENT: normal  Neck: no JVD, carotid bruits, or masses Cardiac: RRR; soft murmurs, rubs, or gallops,no edema  Respiratory:  clear to auscultation bilaterally, normal work  of breathing GI: soft, nontender, nondistended, + BS MS: no deformity or atrophy  Skin: warm and dry, no rash Neuro:  Alert and Oriented x 3, Strength and sensation are intact Psych: euthymic mood, full affect  Wt Readings from Last 3 Encounters:  05/18/18 124 lb 1.9 oz (56.3 kg)  04/28/18 119 lb 2 oz (54 kg)  02/17/18 128 lb (58.1 kg)      Studies/Labs Reviewed:   EKG:  EKG is not ordered today.   EKG 05/01/2018 showed sinus rhythm, repolarization abnormality, anterior T wave and ventricular trigeminy  Recent Labs: 07/01/2017: ALT 17; TSH 1.430 04/28/2018: BUN 12; Creatinine, Ser 0.56; Hemoglobin 8.3; Platelets 117; Potassium 4.1; Sodium 137   Lipid Panel    Component Value Date/Time   CHOL 120 (L) 07/02/2015 0001   TRIG 91 07/02/2015 0001   HDL 49 07/02/2015 0001   CHOLHDL 2.4 07/02/2015 0001   VLDL 18 07/02/2015 0001   LDLCALC 53 07/02/2015 0001    Additional studies/ records that were reviewed today include:   Echocardiogram: 08/2017 Study Conclusions  - Left ventricle: The cavity size was normal. Wall thickness was   normal. Systolic function was mildly reduced. The estimated   ejection fraction was in the range of 45% to 50%. Doppler   parameters are consistent with abnormal left ventricular   relaxation (grade 1 diastolic dysfunction). - Ventricular septum: Septal motion showed paradox. - Aortic valve: There was mild regurgitation  directed eccentrically   in the LVOT and along the septum. - Mitral valve: Moderately thickened leaflets . Mobility was   restricted. 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 The findings are   consistent with trivial stenosis. There was moderate   regurgitation. Valve area by pressure half-time: 2.02 cm^2. - Left atrium: The atrium was moderately dilated. - Right ventricle: The cavity size was mildly dilated. Systolic   function was mildly to moderately reduced. - Right atrium: The atrium was mildly dilated. - Atrial septum: No defect or patent foramen ovale was identified. - Pulmonary arteries: Systolic pressure was moderately increased.   PA peak pressure: 59 mm Hg (S).    ASSESSMENT & PLAN:    1. Dyspnea/chest pressure -Patient has chronic dyspnea thought to be related to emphysema/COPD.  However, she does not have followed up with pulmonary.  CTA of the chest in hospital did not showed PE however showed pulmonary arterial hypertension and emphysematous changes. I have reviewed with DOD Dr. Rayann Heman.  Her EKG has repolarization abnormality with PVC and chronic T wave inversion anteriorly.  Will get echocardiogram for further evaluation.  Not suspicious for ischemic etiology however cannot rule out completely. -In the emergency room she was anemic at hemoglobin of 8.3 which is lower from her baseline 12.  Patient denies any bleeding issue.  Her platelets was improved in ER at 117 K from baseline of 80-90K.  Will get CBC checked today as well.  - Euvolemic by exam.   2.  Congenital thrombocytopenia - Followed by Dr. Lindi Adie  3. Hx of VSD/PDA - Repeat echo as above    Medication Adjustments/Labs and Tests Ordered: Current medicines are reviewed at length with the patient today.  Concerns regarding medicines are outlined above.  Medication changes, Labs and Tests ordered today are listed in the Patient Instructions below. Patient Instructions  Medication Instructions:   Your physician  recommends that you continue on your current medications as directed. Please refer to the Current Medication list given to you today.   If you need a  refill on your cardiac medications before your next appointment, please call your pharmacy.   Lab work: CBC TODAY    If you have labs (blood work) drawn today and your tests are completely normal, you will receive your results only by: Marland Kitchen MyChart Message (if you have MyChart) OR . A paper copy in the mail If you have any lab test that is abnormal or we need to change your treatment, we will call you to review the results.  Testing/Procedures: Your physician has requested that you have an echocardiogram. Echocardiography is a painless test that uses sound waves to create images of your heart. It provides your doctor with information about the size and shape of your heart and how well your heart's chambers and valves are working. This procedure takes approximately one hour. There are no restrictions for this procedure.   Follow-Up:  IN 2 WEEKS WITH DR [name] OR AVAILABLE APP ON DAY DR [name] IN OFFICE    Any Other Special Instructions Will Be Listed Below (If Applicable).       Jarrett Soho, [place]  05/18/2018 3:25 PM    [facility] [place], [place], [place]  [address] Phone: [phone]; Fax: [phone]

## 2018-05-19 ENCOUNTER — Telehealth: Payer: Self-pay | Admitting: Physician Assistant

## 2018-05-19 ENCOUNTER — Encounter (HOSPITAL_COMMUNITY): Payer: Self-pay | Admitting: Emergency Medicine

## 2018-05-19 ENCOUNTER — Observation Stay (HOSPITAL_COMMUNITY)
Admission: EM | Admit: 2018-05-19 | Discharge: 2018-05-20 | Disposition: A | Discharge status: Home / Self Care | Payer: BLUE CROSS/BLUE SHIELD | Attending: Family Medicine | Admitting: Family Medicine

## 2018-05-19 ENCOUNTER — Telehealth: Payer: Self-pay | Admitting: *Deleted

## 2018-05-19 ENCOUNTER — Other Ambulatory Visit: Payer: Self-pay

## 2018-05-19 DIAGNOSIS — Z79899 Other long term (current) drug therapy: Secondary | ICD-10-CM | POA: Diagnosis not present

## 2018-05-19 DIAGNOSIS — Z8249 Family history of ischemic heart disease and other diseases of the circulatory system: Secondary | ICD-10-CM | POA: Insufficient documentation

## 2018-05-19 DIAGNOSIS — I2723 Pulmonary hypertension due to lung diseases and hypoxia: Secondary | ICD-10-CM

## 2018-05-19 DIAGNOSIS — I272 Pulmonary hypertension, unspecified: Secondary | ICD-10-CM | POA: Insufficient documentation

## 2018-05-19 DIAGNOSIS — J449 Chronic obstructive pulmonary disease, unspecified: Secondary | ICD-10-CM

## 2018-05-19 DIAGNOSIS — D6942 Congenital and hereditary thrombocytopenia purpura: Secondary | ICD-10-CM | POA: Insufficient documentation

## 2018-05-19 DIAGNOSIS — N925 Other specified irregular menstruation: Secondary | ICD-10-CM | POA: Insufficient documentation

## 2018-05-19 DIAGNOSIS — D696 Thrombocytopenia, unspecified: Secondary | ICD-10-CM | POA: Diagnosis present

## 2018-05-19 DIAGNOSIS — R7303 Prediabetes: Secondary | ICD-10-CM | POA: Diagnosis not present

## 2018-05-19 DIAGNOSIS — D509 Iron deficiency anemia, unspecified: Principal | ICD-10-CM

## 2018-05-19 DIAGNOSIS — J439 Emphysema, unspecified: Secondary | ICD-10-CM | POA: Insufficient documentation

## 2018-05-19 DIAGNOSIS — R634 Abnormal weight loss: Secondary | ICD-10-CM

## 2018-05-19 DIAGNOSIS — D649 Anemia, unspecified: Secondary | ICD-10-CM | POA: Diagnosis present

## 2018-05-19 DIAGNOSIS — N920 Excessive and frequent menstruation with regular cycle: Secondary | ICD-10-CM | POA: Diagnosis not present

## 2018-05-19 DIAGNOSIS — N921 Excessive and frequent menstruation with irregular cycle: Secondary | ICD-10-CM

## 2018-05-19 LAB — CBC WITH DIFFERENTIAL/PLATELET
Abs Immature Granulocytes: 0.02 10*3/uL (ref 0.00–0.07)
Basophils Absolute: 0 10*3/uL (ref 0.0–0.1)
Basophils Relative: 0 %
EOS PCT: 0 %
Eosinophils Absolute: 0 10*3/uL (ref 0.0–0.5)
HCT: 26.4 % — ABNORMAL LOW (ref 36.0–46.0)
Hemoglobin: 7.3 g/dL — ABNORMAL LOW (ref 12.0–15.0)
Immature Granulocytes: 1 %
Lymphocytes Relative: 20 %
Lymphs Abs: 0.9 10*3/uL (ref 0.7–4.0)
MCH: 21.4 pg — ABNORMAL LOW (ref 26.0–34.0)
MCHC: 27.7 g/dL — AB (ref 30.0–36.0)
MCV: 77.4 fL — ABNORMAL LOW (ref 80.0–100.0)
Monocytes Absolute: 0.4 10*3/uL (ref 0.1–1.0)
Monocytes Relative: 9 %
Neutro Abs: 3.1 10*3/uL (ref 1.7–7.7)
Neutrophils Relative %: 70 %
Platelets: 89 10*3/uL — ABNORMAL LOW (ref 150–400)
RBC: 3.41 MIL/uL — ABNORMAL LOW (ref 3.87–5.11)
RDW: 16.5 % — AB (ref 11.5–15.5)
WBC: 4.4 10*3/uL (ref 4.0–10.5)
nRBC: 0 % (ref 0.0–0.2)

## 2018-05-19 LAB — CBC
Hematocrit: 23.5 % — ABNORMAL LOW (ref 34.0–46.6)
Hemoglobin: 6.9 g/dL — CL (ref 11.1–15.9)
MCH: 21.2 pg — ABNORMAL LOW (ref 26.6–33.0)
MCHC: 29.4 g/dL — ABNORMAL LOW (ref 31.5–35.7)
MCV: 72 fL — ABNORMAL LOW (ref 79–97)
PLATELETS: 90 10*3/uL — AB (ref 150–450)
RBC: 3.25 x10E6/uL — AB (ref 3.77–5.28)
RDW: 14.5 % (ref 11.7–15.4)
WBC: 5.9 10*3/uL (ref 3.4–10.8)

## 2018-05-19 LAB — IRON AND TIBC
Iron: 20 ug/dL — ABNORMAL LOW (ref 28–170)
Saturation Ratios: 3 % — ABNORMAL LOW (ref 10.4–31.8)
TIBC: 615 ug/dL — ABNORMAL HIGH (ref 250–450)
UIBC: 595 ug/dL

## 2018-05-19 LAB — VITAMIN B12: VITAMIN B 12: 550 pg/mL (ref 180–914)

## 2018-05-19 LAB — I-STAT BETA HCG BLOOD, ED (MC, WL, AP ONLY): I-stat hCG, quantitative: <5 m[IU]/mL (ref <5)

## 2018-05-19 LAB — COMPREHENSIVE METABOLIC PANEL
ALT: 27 U/L (ref 0–44)
AST: 27 U/L (ref 15–41)
Albumin: 4.2 g/dL (ref 3.5–5.0)
Alkaline Phosphatase: 24 U/L — ABNORMAL LOW (ref 38–126)
Anion gap: 7 (ref 5–15)
BUN: 8 mg/dL (ref 6–20)
CALCIUM: 9 mg/dL (ref 8.9–10.3)
CO2: 22 mmol/L (ref 22–32)
Chloride: 108 mmol/L (ref 98–111)
Creatinine, Ser: 0.62 mg/dL (ref 0.44–1.00)
GFR calc Af Amer: >60 mL/min (ref >60)
GFR calc non Af Amer: >60 mL/min (ref >60)
Glucose, Bld: 70 mg/dL (ref 70–99)
Potassium: 3.8 mmol/L (ref 3.5–5.1)
Sodium: 137 mmol/L (ref 135–145)
Total Bilirubin: 1.5 mg/dL — ABNORMAL HIGH (ref 0.3–1.2)
Total Protein: 7.7 g/dL (ref 6.5–8.1)

## 2018-05-19 LAB — RETICULOCYTES
Immature Retic Fract: 16.9 % — ABNORMAL HIGH (ref 2.3–15.9)
RBC.: 3.41 MIL/uL — ABNORMAL LOW (ref 3.87–5.11)
RETIC CT PCT: 1.3 % (ref 0.4–3.1)
Retic Count, Absolute: 43 10*3/uL (ref 19.0–186.0)

## 2018-05-19 LAB — FOLATE: Folate: 18.5 ng/mL (ref >5.9)

## 2018-05-19 LAB — POC OCCULT BLOOD, ED: Fecal Occult Bld: NEGATIVE

## 2018-05-19 LAB — FERRITIN: Ferritin: 4 ng/mL — ABNORMAL LOW (ref 11–307)

## 2018-05-19 LAB — PREPARE RBC (CROSSMATCH)

## 2018-05-19 LAB — LACTATE DEHYDROGENASE: LDH: 183 U/L (ref 98–192)

## 2018-05-19 MED ORDER — UMECLIDINIUM BROMIDE 62.5 MCG/INH IN AEPB
1.0000 | INHALATION_SPRAY | Freq: Every day | RESPIRATORY_TRACT | Status: DC
  Administered 2018-05-19 – 2018-05-20 (×2): 1 via RESPIRATORY_TRACT
  Filled 2018-05-19: qty 7

## 2018-05-19 MED ORDER — SODIUM CHLORIDE 0.9 % IV SOLN
10.0000 mL/h | Freq: Once | INTRAVENOUS | Status: AC
  Administered 2018-05-19: 10 mL/h via INTRAVENOUS

## 2018-05-19 MED ORDER — SODIUM CHLORIDE 0.9% FLUSH: 3.0000 mL | Freq: Two times a day (BID) | INTRAVENOUS | Status: DC

## 2018-05-19 MED ORDER — ALBUTEROL SULFATE (2.5 MG/3ML) 0.083% IN NEBU: 2.5000 mg | INHALATION_SOLUTION | Freq: Four times a day (QID) | RESPIRATORY_TRACT | Status: DC | PRN

## 2018-05-19 NOTE — Telephone Encounter (Signed)
Returned pts call and advised her that we don't have her results for her blood work back at this time, and when they come in, someone will call her with those. Pt thanked me for the call.

## 2018-05-19 NOTE — ED Notes (Signed)
ED TO INPATIENT HANDOFF REPORT  Name/Age/Gender Tammie Wade 23 y.o. female  Code Status   Home/SNF/Other Home  Chief Complaint Needs Blood Transfusion  Level of Care/Admitting Diagnosis ED Disposition    ED Disposition Condition Comment   Admit  Hospital Area: [facility] [[id number]]  Level of Care: Telemetry [5]  Admit to tele based on following criteria: Eval of Syncope  Diagnosis: Anemia [374827]  Admitting Physician: Nita Sells [phone]  Attending Physician: Nita Sells [[id number]]  PT Class (Do Not Modify): Observation [104]  PT Acc Code (Do Not Modify): Observation [10022]       Medical History Past Medical History:  Diagnosis Date  . Abnormal uterine bleeding   . ASD (atrial septal defect)   . Blood transfusion without reported diagnosis    13 yrs at [facility] in [place] -4 units  . Bronchopulmonary dysplasia   . Chronic lung disease   . Congenital thrombocytopenia ([place])   . COPD (chronic obstructive pulmonary disease) ([place])   . Emphysema lung ([place])   . Emphysema of lung ([place])   . 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 History of seizures   . Learning disabilities   . Meconium aspiration   . Prediabetes   . S/P repair of PDA   . Thrombocytopenia ([place])   . VSD (ventricular septal defect)     Allergies No Known Allergies  IV Location/Drains/Wounds Patient Lines/Drains/Airways Status   Active Line/Drains/Airways    Name:   Placement date:   Placement time:   Site:   Days:   Peripheral IV 05/19/18 Right Antecubital   05/19/18    1402    Antecubital   less than 1   Incision (Closed) 01/24/18 Vagina   01/24/18    0831     115          Labs/Imaging Results for orders placed or performed during the hospital encounter of 05/19/18 (from the past 48 hour(s))  Comprehensive metabolic panel     Status: Abnormal   Collection Time: 05/19/18  2:04 PM  Result Value Ref Range   Sodium 137 135  - 145 mmol/L   Potassium 3.8 3.5 - 5.1 mmol/L   Chloride 108 98 - 111 mmol/L   CO2 22 22 - 32 mmol/L   Glucose, Bld 70 70 - 99 mg/dL   BUN 8 6 - 20 mg/dL   Creatinine, Ser 0.62 0.44 - 1.00 mg/dL   Calcium 9.0 8.9 - 10.3 mg/dL   Total Protein 7.7 6.5 - 8.1 g/dL   Albumin 4.2 3.5 - 5.0 g/dL   AST 27 15 - 41 U/L   ALT 27 0 - 44 U/L   Alkaline Phosphatase 24 (L) 38 - 126 U/L   Total Bilirubin 1.5 (H) 0.3 - 1.2 mg/dL   GFR calc non Af Amer >60 >60 mL/min   GFR calc Af Amer >60 >60 mL/min   Anion gap 7 5 - 15    Comment: Performed at [facility], [place] [address] [place], [place] [address]  CBC with Differential     Status: Abnormal   Collection Time: 05/19/18  2:04 PM  Result Value Ref Range   WBC 4.4 4.0 - 10.5 K/uL   RBC 3.41 (L) 3.87 - 5.11 MIL/uL   Hemoglobin 7.3 (L) 12.0 - 15.0 g/dL    Comment: Reticulocyte Hemoglobin testing may be clinically indicated, consider ordering this additional test [id number]  HCT 26.4 (L) 36.0 - 46.0 %   MCV 77.4 (L) 80.0 - 100.0 fL   MCH 21.4 (L) 26.0 - 34.0 pg   MCHC 27.7 (L) 30.0 - 36.0 g/dL   RDW 16.5 (H) 11.5 - 15.5 %   Platelets 89 (L) 150 - 400 K/uL    Comment: Immature Platelet Fraction may be clinically indicated, consider ordering this additional test [id number]    nRBC 0.0 0.0 - 0.2 %   Neutrophils Relative % 70 %   Neutro Abs 3.1 1.7 - 7.7 K/uL   Lymphocytes Relative 20 %   Lymphs Abs 0.9 0.7 - 4.0 K/uL   Monocytes Relative 9 %   Monocytes Absolute 0.4 0.1 - 1.0 K/uL   Eosinophils Relative 0 %   Eosinophils Absolute 0.0 0.0 - 0.5 K/uL   Basophils Relative 0 %   Basophils Absolute 0.0 0.0 - 0.1 K/uL   Immature Granulocytes 1 %   Abs Immature Granulocytes 0.02 0.00 - 0.07 K/uL    Comment: Performed at [facility], [place] [address] [place], [place] [address]  Vitamin B12     Status: None   Collection Time: 05/19/18  2:04 PM  Result Value Ref Range   Vitamin B-12 550 180 - 914 pg/mL     Comment: (NOTE) This assay is not validated for testing neonatal or myeloproliferative syndrome specimens for Vitamin B12 levels. Performed at [facility], [place] [address] [place], [place] [address]   Folate     Status: None   Collection Time: 05/19/18  2:04 PM  Result Value Ref Range   Folate 18.5 >5.9 ng/mL    Comment: Performed at [facility], [place] [address], [place], [place] [address]  Iron and TIBC     Status: Abnormal   Collection Time: 05/19/18  2:04 PM  Result Value Ref Range   Iron 20 (L) 28 - 170 ug/dL   TIBC 615 (H) 250 - 450 ug/dL   Saturation Ratios 3 (L) 10.4 - 31.8 %   UIBC 595 ug/dL    Comment: Performed at [facility], [place] [address], [place], [place] [address]  Ferritin     Status: Abnormal   Collection Time: 05/19/18  2:04 PM  Result Value Ref Range   Ferritin 4 (L) 11 - 307 ng/mL    Comment: Performed at [facility], [place] [address], [place], [place] [address]  Reticulocytes     Status: Abnormal   Collection Time: 05/19/18  2:04 PM  Result Value Ref Range   Retic Ct Pct 1.3 0.4 - 3.1 %   RBC. 3.41 (L) 3.87 - 5.11 MIL/uL   Retic Count, Absolute 43.0 19.0 - 186.0 K/uL   Immature Retic Fract 16.9 (H) 2.3 - 15.9 %    Comment: Performed at [facility], [place] [address], [place], [place] [address]  Type and screen [place]     Status: None (Preliminary result)   Collection Time: 05/19/18  2:07 PM  Result Value Ref Range   ABO/RH(D) B POS    Antibody Screen NEG    Sample Expiration 05/22/2018    Unit Number [id number]    Blood Component Type RED CELLS,LR    Unit division 00    Status of Unit ALLOCATED    Transfusion Status OK TO TRANSFUSE    Crossmatch Result      Compatible Performed at [facility], [place] [address] [place], [place]  [address]   ABO/Rh     Status: None (Preliminary result)   Collection Time: 05/19/18   2:07 PM  Result Value Ref Range   ABO/RH(D)      B POS Performed at [facility], [place] [address] [place], [place] [address]   I-Stat beta hCG blood, ED     Status: None   Collection Time: 05/19/18  2:14 PM  Result Value Ref Range   I-stat hCG, quantitative <5.0 <5 mIU/mL   Comment 3            Comment:   GEST. AGE      CONC.  (mIU/mL)   <=1 WEEK        5 - 50     2 WEEKS       50 - 500     3 WEEKS       100 - 10,000     4 WEEKS     1,000 - 30,000        FEMALE AND NON-PREGNANT FEMALE:     LESS THAN 5 mIU/mL   Prepare RBC     Status: None   Collection Time: 05/19/18  3:58 PM  Result Value Ref Range   Order Confirmation      ORDER PROCESSED BY BLOOD BANK Performed at [facility], [place] [address] [place], [place] [address]   Lactate dehydrogenase     Status: None   Collection Time: 05/19/18  4:40 PM  Result Value Ref Range   LDH 183 98 - 192 U/L    Comment: Performed at [facility], [place] [address], [place], [place] [address]  POC occult blood, ED Provider will collect     Status: None   Collection Time: 05/19/18  4:44 PM  Result Value Ref Range   Fecal Occult Bld NEGATIVE NEGATIVE   No results found. None  Pending Labs [employer] (From admission, onward)    Start     Ordered   Signed and Held  CBC  Tomorrow morning,   R     Signed and Held   Signed and Held  Comprehensive metabolic panel  Tomorrow morning,   R     Signed and Held   Signed and Held  Protime-INR  Tomorrow morning,   R     Signed and Held          Vitals/Pain Today's Vitals   05/19/18 1530 05/19/18 1545 05/19/18 1615 05/19/18 1630  BP: 105/61 (!) 111/57 115/70 105/61  Pulse: 76 74 76 72  Resp: (!) _0 (!) 25  Temp:      TempSrc:      SpO2: 100% 99% 98% 99%  Weight:      Height:      PainSc:        Isolation Precautions No active isolations  Medications Medications  0.9 %  sodium chloride infusion (has no  administration in time range)    Mobility walks

## 2018-05-19 NOTE — ED Provider Notes (Addendum)
[facility] DEPT Provider Note   CSN: [id number] Arrival date & time: 05/19/18  1221     History   Chief Complaint Chief Complaint  Patient presents with  . Abnormal Lab    HPI Tammie Wade is a 23 y.o. female.  HPI  Patient is a 23 year old female with a history of abnormal uterine bleeding, repaired VSD, ASD, bronchopulmonary dysplasia, emphysema, unknown etiology, hearing loss, presenting for low hemoglobin and shortness of breath.  Patient reports that she is always short of breath, and has yet to see pulmonologist, however this is been progressively worsening over the last couple months.  She has a history of heavy menses, usually lasting 7 to 10 days, and is followed by [employer].  She reports that she had a D&C in October 2019 to try to resolve the heavy bleeding, however her periods have shortened but they are still heavy.  Patient reports that she had lab work checked at her cardiology office yesterday and hemoglobin returned at 6.9 today.  She reports that she just finished her last menstrual period 2 days ago and is no longer bleeding.  She denies any hematochezia, melena or dysuria.  She denies any history of GI blood losses, but does report that she had to have a blood transfusion for abnormal uterine bleeding approximately 11 years ago.  She reports she is comfortable at rest, denying any chest pain or shortness of breath currently.  Past Medical History:  Diagnosis Date  . Abnormal uterine bleeding   . ASD (atrial septal defect)   . Blood transfusion without reported diagnosis    13 yrs at [facility] in [place] -4 units  . Bronchopulmonary dysplasia   . Chronic lung disease   . 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 COPD (chronic obstructive pulmonary disease) ([place])   . Emphysema lung ([place])   . Emphysema of lung ([place])   . 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 History of seizures   . Learning  disabilities   . Meconium aspiration   . Prediabetes   . S/P repair of PDA   . Thrombocytopenia ([place])   . VSD (ventricular septal defect)     Patient Active Problem List   Diagnosis Date Noted  . S/P PDA repair 01/02/2018  . Scoliosis 01/02/2018  . VSD (ventricular septal defect) 08/26/2017  . PDA (patent ductus arteriosus) 08/26/2017  . Family history of tuberous sclerosis 09/23/2016  . 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 COPD, severe ([place]) 07/12/2015  . Emphysema lung ([place]) 07/12/2015  . GERD (gastroesophageal reflux disease) 07/12/2015  . 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 Asthma, mild intermittent 01/02/2004  . 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 Difficulty hearing 06/22/2003  . ASD (atrial septal defect), ostium secundum 01/06/1994    Past Surgical History:  Procedure Laterality Date  . 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 DILATATION & CURETTAGE/HYSTEROSCOPY WITH MYOSURE N/A 01/24/2018   Procedure: DILATATION & CURETTAGE/HYSTEROSCOPY , PAP SMEAR;  Surgeon: Megan Salon, MD;  Location: [facility];  Service: Gynecology;  Laterality: N/A;  Possible Myosure  . EYE SURGERY Bilateral    x 2 weak muscles  . PATENT DUCTUS ARTERIOUS REPAIR     newborn  . VSD REPAIR     newborn     OB History    [name]  0   Para  0   Term  0   Preterm  0   AB  0   Living  0     SAB  0   TAB  0   Ectopic  0   Multiple  0   Live Births               Home Medications    Prior to Admission medications   Medication Sig Start Date End Date Taking? Authorizing Provider  Multiple Vitamin (MULTIVITAMIN WITH MINERALS) TABS tablet Take 1 tablet by mouth daily.    Yes [provider]  norgestrel-ethinyl estradiol (LOW-OGESTREL) 0.3-30 MG-MCG tablet Take 1 tablet by mouth daily. 08/10/17  Yes Megan Salon, MD  umeclidinium bromide (INCRUSE ELLIPTA) 62.5 MCG/INH  AEPB Inhale 1 puff into the lungs daily. 03/21/18  Yes Henson, Vickie L, NP-C  albuterol (PROVENTIL HFA;VENTOLIN HFA) 108 (90 Base) MCG/ACT inhaler Inhale 2 puffs into the lungs every 6 (six) hours as needed for wheezing or shortness of breath.    [provider]  doxycycline (VIBRA-TABS) 100 MG tablet Take 1 tablet (100 mg total) by mouth 2 (two) times daily. Patient not taking: Reported on 05/19/2018 04/28/18   Francine Graven, DO    Family History Family History  Problem Relation Age of Onset  . Osteopenia Mother   . Hypertension Mother   . Diabetes Mother   . Breast cancer Maternal Aunt   . Cancer Maternal Aunt   . Hyperlipidemia Maternal Aunt   . Diabetes Maternal Aunt   . Lung cancer Maternal Grandmother   . Hyperlipidemia Maternal Grandmother   . Diabetes Maternal Grandmother   . COPD Maternal Grandmother   . Emphysema Maternal Grandmother   . Sickle cell anemia Maternal Grandmother   . Hyperlipidemia Maternal Grandfather   . Diabetes Maternal Grandfather   . Hypertension Father   . Hyperlipidemia Father   . Sickle cell anemia Maternal Uncle   . Rheum arthritis Cousin     Social History Social History   Tobacco Use  . Smoking status: Never Smoker  . Smokeless tobacco: Never Used  Substance Use Topics  . Alcohol use: No    Alcohol/week: 0.0 standard drinks  . Drug use: No     Allergies   Patient has no known allergies.   Review of Systems Review of Systems  Constitutional: Negative for chills and fever.  HENT: Negative for congestion and sore throat.   Eyes: Negative for visual disturbance.  Respiratory: Positive for shortness of breath. Negative for cough and chest tightness.   Cardiovascular: Negative for chest pain.  Gastrointestinal: Negative for abdominal pain, blood in stool, nausea and vomiting.  Genitourinary: Negative for dysuria and flank pain.  Musculoskeletal: Negative for back pain and myalgias.  Skin: Negative for rash.    Neurological: Positive for light-headedness. Negative for dizziness, syncope and headaches.     Physical Exam Updated Vital Signs BP (!) 116/58   Pulse 62   Temp 99 F (37.2 C) (Oral)   Resp 16   Ht _0  (1.676 m)   Wt 56.2 kg   LMP 04/19/2018   SpO2 100%   BMI 20.01 kg/m   Physical Exam Vitals signs and nursing note reviewed.  Constitutional:      General: She is not in acute distress.    Appearance: She is well-developed.  HENT:     Head: Normocephalic and atraumatic.  Eyes:     Pupils: Pupils are equal, round, and  reactive to light.     Comments: Pale conjunctiva. Pale mucous membranes.   Neck:     Musculoskeletal: Normal range of motion and neck supple.  Cardiovascular:     Rate and Rhythm: Normal rate and regular rhythm.     Heart sounds: S1 normal and S2 normal. Murmur present.     Comments: Systolic murmur.  Pulmonary:     Effort: Pulmonary effort is normal.     Breath sounds: Normal breath sounds.     Comments: Coarse lung sounds in bilateral bases.  Abdominal:     General: There is no distension.     Palpations: Abdomen is soft.     Tenderness: There is no abdominal tenderness. There is no guarding.  Musculoskeletal: Normal range of motion.        General: No deformity.  Lymphadenopathy:     Cervical: No cervical adenopathy.  Skin:    General: Skin is warm and dry.     Findings: No erythema or rash.  Neurological:     Mental Status: She is alert.     Comments: Cranial nerves grossly intact. Patient moves extremities symmetrically and with good coordination.  Psychiatric:        Behavior: Behavior normal.        Thought Content: Thought content normal.        Judgment: Judgment normal.      ED Treatments / Results  Labs (all labs ordered are listed, but only abnormal results are displayed) Labs Reviewed  COMPREHENSIVE METABOLIC PANEL - Abnormal; Notable for the following components:      Result Value   Alkaline Phosphatase 24 (*)    Total  Bilirubin 1.5 (*)    All other components within normal limits  CBC WITH DIFFERENTIAL/PLATELET - Abnormal; Notable for the following components:   RBC 3.41 (*)    Hemoglobin 7.3 (*)    HCT 26.4 (*)    MCV 77.4 (*)    MCH 21.4 (*)    MCHC 27.7 (*)    RDW 16.5 (*)    Platelets 89 (*)    All other components within normal limits  IRON AND TIBC - Abnormal; Notable for the following components:   Iron 20 (*)    TIBC 615 (*)    Saturation Ratios 3 (*)    All other components within normal limits  FERRITIN - Abnormal; Notable for the following components:   Ferritin 4 (*)    All other components within normal limits  RETICULOCYTES - Abnormal; Notable for the following components:   RBC. 3.41 (*)    Immature Retic Fract 16.9 (*)    All other components within normal limits  VITAMIN B12  FOLATE  LACTATE DEHYDROGENASE  POC OCCULT BLOOD, ED  I-STAT BETA HCG BLOOD, ED (MC, [facility], AP ONLY)  TYPE AND SCREEN  PREPARE RBC (CROSSMATCH)  ABO/RH    EKG None  Radiology No results found.  Procedures .Critical Care Performed by: Albesa Seen, PA-C Authorized by: Albesa Seen, PA-C   Critical care provider statement:    Critical care time (minutes):  35   Critical care time was exclusive of:  Separately billable procedures and treating other patients   Critical care was necessary to treat or prevent imminent or life-threatening deterioration of the following conditions: Symptomatic anemia requiring blood transfusion.   Critical care was time spent personally by me on the following activities:  Blood draw for specimens, ordering and performing treatments and interventions, ordering and review of  laboratory studies, development of treatment plan with patient or surrogate, discussions with consultants, discussions with primary provider, re-evaluation of patient's condition, examination of patient, review of old charts and obtaining history from patient or surrogate   (including critical  care time)  Medications Ordered in ED Medications - No data to display   Initial Impression / Assessment and Plan / ED Course  I have reviewed the triage vital signs and the nursing notes.  Pertinent labs & imaging results that were available during my care of the patient were reviewed by me and considered in my medical decision making (see chart for details).  Clinical Course as of May 20 1803  Thu May 19, 2018  1558 Up from 6.9 yesterday. Pt still symptomatic.   Hemoglobin(!): 7.3 [AM]  1558 C/w IDA.   Iron and TIBC(!) [AM]    Clinical Course User Index [AM] Albesa Seen, PA-C    Patient nontoxic-appearing and hemodynamically stable in the emergency department.  Suspect chronic losses secondary to heavy vaginal bleeding.  Will confirm hemoglobin and transfuse as necessary.  Hemoglobin 7.3 today.  Patient is still symptomatic, and will transfuse.  Hematocrit 26.4.  Anemia panel consistent with iron deficiency anemia.  Platelets are 89.  6:01 PM Spoke with Dr. Sabra Heck of [facility] who states that patient may be a candidate for a different method of controlling her menses.  She is currently on combined oral contraceptives, however she may be a candidate for IUD or Depo-Provera.  Dr. Sabra Heck also feels that patient may benefit from iron transfusions which she can be set up for as an outpatient.  She will help facilitate this after patient is discharged.  She does not feel that patient requires pelvic ultrasound repeated at this time.  I appreciate her involvement in the care of this patient.  Dr. Verlon Au accepting of patient for observation. Appreciate his involvement in the care of this patient.   Final Clinical Impressions(s) / ED Diagnoses   Final diagnoses:  Symptomatic anemia  Thrombocytopenia [facility])    ED Discharge Orders    None       Tamala Julian 05/19/18 1805    Sherwood Gambler, MD 05/20/18 [address], PA-C 06/09/18 1617      Sherwood Gambler, MD 06/20/18 [phone]

## 2018-05-19 NOTE — Telephone Encounter (Signed)
Discussed pt's critical lab value with Vin Bhagat, PA-C, per Vin, pt needs to go to the Emergency Room. Called pt and advised pt and pt's mother that she needed to go. Pt's mom verbalized that they will go to [facility].

## 2018-05-19 NOTE — Telephone Encounter (Signed)
Pt called asking about the results of her bloodwork

## 2018-05-19 NOTE — ED Triage Notes (Addendum)
Patient report that she was see by here primary care doctor yesterday for follow up. States that they called her this morning due to low hemoglobin. Hx of COPD and chronic thrombocytopenia.

## 2018-05-19 NOTE — Telephone Encounter (Signed)
Received call from Santiago Glad with [facility] with critical result Hemoglobin 6.9.

## 2018-05-19 NOTE — H&P (Signed)
HPI  Tammie Wade FBP:[id number] DOB: April 05, 1996 DOA: 05/19/2018  PCP: [name], NP-C   Chief Complaint: Low hemoglobin  HPI:  23 year old African-American female H/o VSD/PDA repair 2 weeks of life Congenital thrombocytopenia Menorrhagia since menarche at age 69-previously 45-day cycles on Depo-Provera but taken off of this after 1 year for unknown reasons--prior [facility] 01/24/2018 Possible COPD?  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 and is able to do her activities without usual shortness of breath  Getting up to just step 3-4 steps she has been more winded than recently In discussing with her mother she is lost a lot of weight since last year Tells me that menses started back again at the end of December despite having the [facility]   ED Course: Patient was seen and evaluated labs were performed-found to have a hemoglobin 7.3 down from her baseline 10-12 platelets are 89 no Maiers count Iron level was 20 saturation ratio was 3 EKG was not performed  Review of Systems:   Negative for fever, visual changes, sore throat, rash, new muscle aches, chest pain, SOB, dysuria, bleeding, n/v/abdominal pain.  Has been coughing to some degree recently was changed over from [employer] to [employer] She has had no dark or tarry stool or change in bowel movements or habits no vomiting she has had some chest pain and pressure with exertion she has not had a rash she does not feel weak on any one-sided body with strokelike symptoms she has not had any  seizures she is not had any blurred or double vision   Past Medical History:  Diagnosis Date  . Abnormal uterine bleeding   . ASD (atrial septal defect)   . Blood transfusion without reported diagnosis    13 yrs at [facility] in [place] -4 units  . Bronchopulmonary dysplasia   . Chronic lung disease   . 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 COPD (chronic obstructive pulmonary disease) ([place])   . Emphysema lung ([place])   . Emphysema of lung ([place])   . 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 History of seizures   . Learning disabilities   . Meconium aspiration   . Prediabetes   . S/P repair of PDA   . Thrombocytopenia ([place])   . VSD (ventricular septal defect)     Past Surgical History:  Procedure Laterality Date  . CARDIAC SURGERY     open heart at 53 weeks old - bottom 2 chambers of heart were closed  . DILATATION & CURETTAGE/HYSTEROSCOPY WITH MYOSURE N/A 01/24/2018   Procedure: DILATATION & CURETTAGE/HYSTEROSCOPY , PAP SMEAR;  Surgeon: Megan Salon, MD;  Location: [facility];  Service: Gynecology;  Laterality: N/A;  Possible Myosure  . EYE SURGERY Bilateral    x 2 weak muscles  . PATENT DUCTUS ARTERIOUS REPAIR     newborn  . VSD REPAIR     newborn     reports that she has never smoked. She has never used  smokeless tobacco. She reports that she does not drink alcohol or use drugs. Mobility: Independent  No Known Allergies  Family History  Problem Relation Age of Onset  . Osteopenia Mother   . Hypertension Mother   . Diabetes Mother   . Breast cancer Maternal Aunt   . Cancer Maternal Aunt   . Hyperlipidemia Maternal Aunt   . Diabetes Maternal Aunt   . Lung cancer Maternal Grandmother   . Hyperlipidemia Maternal Grandmother   . Diabetes Maternal Grandmother   . COPD Maternal Grandmother   . Emphysema Maternal Grandmother   . Sickle cell anemia Maternal Grandmother   . Hyperlipidemia Maternal Grandfather   . Diabetes Maternal Grandfather   .  Hypertension Father   . Hyperlipidemia Father   . Sickle cell anemia Maternal Uncle   . Rheum arthritis Cousin      Prior to Admission medications   Medication Sig Start Date End Date Taking? Authorizing Provider  Multiple Vitamin (MULTIVITAMIN WITH MINERALS) TABS tablet Take 1 tablet by mouth daily.    Yes [provider]  norgestrel-ethinyl estradiol (LOW-OGESTREL) 0.3-30 MG-MCG tablet Take 1 tablet by mouth daily. 08/10/17  Yes Megan Salon, MD  umeclidinium bromide (INCRUSE ELLIPTA) 62.5 MCG/INH AEPB Inhale 1 puff into the lungs daily. 03/21/18  Yes Henson, Vickie L, NP-C  albuterol (PROVENTIL HFA;VENTOLIN HFA) 108 (90 Base) MCG/ACT inhaler Inhale 2 puffs into the lungs every 6 (six) hours as needed for wheezing or shortness of breath.    [provider]  doxycycline (VIBRA-TABS) 100 MG tablet Take 1 tablet (100 mg total) by mouth 2 (two) times daily. Patient not taking: Reported on 05/19/2018 04/28/18   Francine Graven, DO    Physical Exam:  Vitals:   05/19/18 1615 05/19/18 1630  BP: 115/70 105/61  Pulse: 76 72  Resp: 19 (!) 25  Temp:    SpO2: 98% 99%     Awake alert strabismus noted to left eye  Mild pallor no icterus asthenic build  Mouth is clear with no tonsillar hypertrophy no submandibular lymphadenopathy no thyromegaly  Chest is clinically clear  Anteriorly there is a sternal scar  Q2-I2 holosystolic 3/6 to 4/6 murmur left fifth intercostal space outside of the PMI  I have personally reviewed following labs and imaging studies   Active Problems:   Menorrhagia with irregular cycle   Anemia   Iron deficiency anemia   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)   Weight loss   Assessment/Plan Menorrhagia with recent D&C 01/2018 Unclear what side effect or rationale was behind stopping Depo-Provera-gynecology to discuss with patient regarding the same-may be beneficial to just take Depo-Medrol tablets for 20 days versus Megace-we will defer  to them Severe iron deficiency anemia Transfusing 1 unit of packed red blood cells now-prior to discharge will need IV iron and will forward to her oncologist for reestablishment of care Dyspnea on exertion Weight loss in addition to dyspnea on exertion points to possible high-output heart failure-I will check a TSH for completeness but doubt that this will be abnormal COPD/chronic lung disease versus emphysema These were diagnosed on CT scan 04/28/2018 and she will follow-up with pulmonology as an outpatient-can continue Incruse Ellipta 1 puff daily as well as Ventolin inhalers she does not have any overt wheeze VSD/PDA repair at age 91 weeks Pulmonary hypertension likely resultant from this Echo has been ordered by cardiology as an outpatient and can be followed by them she is euvolemic at this time Congenital thrombocytopenia Unclear if this  is secondary to PDA repair or other causes she has been worked up extensively in [place] where she used to live and follows with oncology care-I would place her on SCDs going forward  Full code, obs, SCDs, discussed with mother in detail    Severity of Illness: The appropriate patient status for this patient is OBSERVATION. Observation status is judged to be reasonable and necessary in order to provide the required intensity of service to ensure the patient's safety. The patient's presenting symptoms, physical exam findings, and initial radiographic and laboratory data in the context of their medical condition is felt to place them at decreased risk for further clinical deterioration. Furthermore, it is anticipated that the patient will be medically stable for discharge from the hospital within 2 midnights of admission. The following factors support the patient status of observation.   " The patient's presenting symptoms include sob. " The physical exam findings include weak tired. " The initial radiographic and laboratory data are stable.   Time  spent: 29 minutes  Verlon Au, MD  [facility] Direct contact: [phone] --[place] [place]  --[email or web address]; password [email or web address]  7PM-7AM contact night coverage as above  05/19/2018, 4:58 PM

## 2018-05-20 ENCOUNTER — Encounter: Payer: Self-pay | Admitting: Family Medicine

## 2018-05-20 DIAGNOSIS — D649 Anemia, unspecified: Secondary | ICD-10-CM | POA: Diagnosis not present

## 2018-05-20 DIAGNOSIS — D509 Iron deficiency anemia, unspecified: Secondary | ICD-10-CM | POA: Diagnosis not present

## 2018-05-20 LAB — COMPREHENSIVE METABOLIC PANEL
ALBUMIN: 3.3 g/dL — AB (ref 3.5–5.0)
ALT: 21 U/L (ref 0–44)
AST: 21 U/L (ref 15–41)
Alkaline Phosphatase: 20 U/L — ABNORMAL LOW (ref 38–126)
Anion gap: 10 (ref 5–15)
BILIRUBIN TOTAL: 1.2 mg/dL (ref 0.3–1.2)
BUN: 8 mg/dL (ref 6–20)
CALCIUM: 8.4 mg/dL — AB (ref 8.9–10.3)
CO2: 19 mmol/L — ABNORMAL LOW (ref 22–32)
Chloride: 106 mmol/L (ref 98–111)
Creatinine, Ser: 0.6 mg/dL (ref 0.44–1.00)
GFR calc Af Amer: >60 mL/min (ref >60)
GFR calc non Af Amer: >60 mL/min (ref >60)
Glucose, Bld: 93 mg/dL (ref 70–99)
Potassium: 4 mmol/L (ref 3.5–5.1)
Sodium: 135 mmol/L (ref 135–145)
Total Protein: 6.4 g/dL — ABNORMAL LOW (ref 6.5–8.1)

## 2018-05-20 LAB — PROTIME-INR
INR: 1.06
Prothrombin Time: 13.7 seconds (ref 11.4–15.2)

## 2018-05-20 LAB — ABO/RH: ABO/RH(D): B POS

## 2018-05-20 LAB — CBC
HCT: 27.4 % — ABNORMAL LOW (ref 36.0–46.0)
HEMOGLOBIN: 8 g/dL — AB (ref 12.0–15.0)
MCH: 22.8 pg — ABNORMAL LOW (ref 26.0–34.0)
MCHC: 29.2 g/dL — ABNORMAL LOW (ref 30.0–36.0)
MCV: 78.1 fL — AB (ref 80.0–100.0)
Platelets: 82 10*3/uL — ABNORMAL LOW (ref 150–400)
RBC: 3.51 MIL/uL — ABNORMAL LOW (ref 3.87–5.11)
RDW: 17.1 % — ABNORMAL HIGH (ref 11.5–15.5)
WBC: 5.2 10*3/uL (ref 4.0–10.5)
nRBC: 0 % (ref 0.0–0.2)

## 2018-05-20 LAB — TYPE AND SCREEN
ABO/RH(D): B POS
Antibody Screen: NEGATIVE
Unit division: 0

## 2018-05-20 LAB — BPAM RBC
Blood Product Expiration Date: 202003052359
ISSUE DATE / TIME: 202002132237
Unit Type and Rh: 7300

## 2018-05-20 MED ORDER — SODIUM CHLORIDE 0.9 % IV SOLN
25.0000 mg | Freq: Once | INTRAVENOUS | Status: AC
  Administered 2018-05-20: 25 mg via INTRAVENOUS
  Filled 2018-05-20: qty 2

## 2018-05-20 NOTE — Discharge Summary (Signed)
Physician Discharge Summary  Tammie Wade WNU:[id number] DOB: 1996-02-17 DOA: 05/19/2018  PCP: [name], NP-C  Admit date: 05/19/2018 Discharge date: 05/20/2018  Time spent: 25 minutes  Recommendations for Outpatient Follow-up:  1. Patient will need outpatient follow-up with Dr. Hale Bogus gynecology for consideration of either Depo-Medrol injections or further D&C and other management 2. Would recommend follow-up with Dr. Lindi Adie for IV iron infusions or if this can be coordinated through PCP office that would be a good idea 3. Needs CBC in about 1 week 4. 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 and is able to do her activities without usual shortness of breath  Getting up to just step 3-4 steps she has been more winded than recently In discussing with her mother she is lost a lot of weight since last year Tells me that menses started  back again at the end of December despite having the [facility]  Patient was kept overnight in the hospital given a unit of blood transfusion hemoglobin went from 6.3-8.0 she was able to ambulate in the hallway did not feel winded or tired-gynecology was consulted by the emergency room and defer decision making as an outpatient regarding her menorrhagia She will get a dose of IV iron given her iron saturation levels were extremely low on this admission and she can follow-up with cardiology as an outpatient for further work-up of dyspnea although I believe that this was more likely secondary to her anemia-of note I did not get a TSH on her this was probably low yield given resolution of her symptoms  Discharge Exam: Vitals:   05/20/18 0115 05/20/18 0541  BP: (!) 103/43 95/66  Pulse: 72 72  Resp: 18 18  Temp: 98.4 F (36.9 C) 98.5 F (36.9 C)  SpO2: 98% 100%    General: Awake alert pleasant no distress Cardiovascular: S1-S2 no murmur rub or gallop Respiratory: Clinically clear no added sound Abdomen soft no rebound no guarding Neurologically intact  Discharge Instructions   Discharge Instructions    Diet - low sodium heart healthy   Complete by:  As directed    Discharge instructions   Complete by:  As directed    Follow-up with your primary care physician as an outpatient with regards to further management of your heavy bleeding Your gynecologist may need to be involved in addition to help you with modalities of treatment of this Please get lab work in about  1 week and we will place you on IV iron and forward ur details to your oncologist so that this can be set up as an outpatient if there is a need   Increase activity slowly   Complete by:  As directed      Allergies as of 05/20/2018   No Known Allergies     Medication List    TAKE these medications   albuterol 108 (90 Base) MCG/ACT inhaler Commonly known as:  PROVENTIL HFA;VENTOLIN HFA Inhale 2 puffs into the lungs every 6  (six) hours as needed for wheezing or shortness of breath.   doxycycline 100 MG tablet Commonly known as:  VIBRA-TABS Take 1 tablet (100 mg total) by mouth 2 (two) times daily.   multivitamin with minerals Tabs tablet Take 1 tablet by mouth daily.   norgestrel-ethinyl estradiol 0.3-30 MG-MCG tablet Commonly known as:  LOW-OGESTREL Take 1 tablet by mouth daily.   umeclidinium bromide 62.5 MCG/INH Aepb Commonly known as:  INCRUSE ELLIPTA Inhale 1 puff into the lungs daily.      No Known Allergies    The results of significant diagnostics from this hospitalization (including imaging, microbiology, ancillary and laboratory) are listed below for reference.    Significant Diagnostic Studies: Dg Chest 2 View  Result Date: 04/28/2018 CLINICAL DATA:  Cough EXAM: CHEST - 2 VIEW COMPARISON:  01/05/2016 FINDINGS: Hyperinflation with diffuse bilateral hyper lucency. Chronic pleural thickening at the CP angles. Postsurgical changes of the mediastinum. Stable cardiomediastinal silhouette with enlarged pulmonary conus. No pneumothorax. IMPRESSION: No active cardiopulmonary disease. Similar appearance of cardiomegaly and hyperinflation. Electronically Signed   By: Donavan Foil M.D.   On: 04/28/2018 18:19   Ct Angio Chest Pe W/cm &/or Wo Cm  Result Date: 04/28/2018 CLINICAL DATA:  Shortness of breath. History of chronic lung disease, emphysema and pediatric heart surgery. EXAM: CT ANGIOGRAPHY CHEST WITH CONTRAST TECHNIQUE: Multidetector CT imaging of the chest was performed using the standard protocol during bolus administration of intravenous contrast. Multiplanar CT image reconstructions and MIPs were obtained to evaluate the vascular anatomy. CONTRAST:  169m ISOVUE-370 IOPAMIDOL (ISOVUE-370) INJECTION 76% COMPARISON:  Chest radiograph April 28, 2018 and CT chest July 23, 2015. FINDINGS: CARDIOVASCULAR: Adequate contrast opacification of the pulmonary artery's. Similarly enlarged main pulmonary  artery at 4.9 cm. No pulmonary arterial filling defects to level of the subsegmental branches. The heart is moderately enlarged with LEFT ventricle thinning and enlarged RIGHT ventricle. Abnormal configuration of aortic root, incompletely assessed. MEDIASTINUM/NODES: No lymphadenopathy by CT size criteria. LUNGS/PLEURA: Tracheobronchial tree is patent, no pneumothorax. Similar hypoplastic and segmented lower lobes with overall decreased parenchymal densities, unchanged. No pleural effusion or focal consolidation. 3 mm RIGHT upper lobe pulmonary nodule, no routine indicated follow-up given patient's age of less than 89years. UPPER ABDOMEN: Non-acute. MUSCULOSKELETAL: Non-acute.  Remote median sternotomy. Review of the MIP images confirms the above findings. IMPRESSION: 1. No acute pulmonary embolism. Chronically enlarged main pulmonary artery seen with pulmonary arterial hypertension the there may be a developmental component. 2. Stable emphysematous changes and chronic interstitial changes. 3. Cardiomegaly and abnormal appearance of aortic root; findings of congenital heart disease. Electronically Signed   By: CElon AlasM.D.   On: 04/28/2018 22:50    Microbiology: No results found for this or any previous visit (from the past 240 hour(s)).   Labs: Basic Metabolic Panel: Recent Labs  Lab 05/19/18 1404 05/20/18 0642  NA 137 135  K 3.8 4.0  CL 108 106  CO2 22 19*  GLUCOSE 70 93  BUN 8 8  CREATININE 0.62 0.60  CALCIUM 9.0 8.4*   Liver Function Tests: Recent Labs  Lab 05/19/18 1404 05/20/18 0642  AST 27 21  ALT 27 21  ALKPHOS 24* 20*  BILITOT 1.5* 1.2  PROT 7.7 6.4*  ALBUMIN 4.2 3.3*   No results for input(s): LIPASE, AMYLASE in the last 168 hours. No results for input(s): AMMONIA in the last 168 hours. CBC: Recent Labs  Lab 05/18/18 1542 05/19/18 1404 05/20/18 0642  WBC 5.9 4.4 5.2  NEUTROABS  --  3.1  --   HGB 6.9* 7.3* 8.0*  HCT 23.5* 26.4* 27.4*  MCV 72* 77.4*  78.1*  PLT 90* 89* 82*   Cardiac Enzymes: No results for input(s): CKTOTAL, CKMB, CKMBINDEX, TROPONINI in the last 168 hours. BNP: BNP (last 3 results) No results for input(s): BNP in the last 8760 hours.  ProBNP (last 3 results) No results for input(s): PROBNP in the last 8760 hours.  CBG: No results for input(s): GLUCAP in the last 168 hours.     Signed:  Nita Sells MD   [facility] 05/20/2018, 9:11 AM

## 2018-05-20 NOTE — Care Management Note (Signed)
Case Management Note  Patient Details  Name: Tammie Wade MRN: [id number] Date of Birth: 1996/01/01  Subjective/Objective:                  discharged  Action/Plan: Discharged to home with self-care, orders checked for hhc needs. No CM needs present at time of discharge.  Patient is able to arrangement own appointments and home care.  Expected Discharge Date:  05/20/18               Expected Discharge Plan:  Home/Self Care  In-House Referral:     Discharge planning Services  CM Consult  Post Acute Care Choice:    Choice offered to:     DME Arranged:    DME Agency:     HH Arranged:    HH Agency:     Status of Service:  Completed, signed off  If discussed at [employer] of Stay Meetings, dates discussed:    Additional Comments:  Leeroy Cha, RN 05/20/2018, 11:14 AM

## 2018-05-23 ENCOUNTER — Telehealth: Payer: Self-pay

## 2018-05-23 ENCOUNTER — Other Ambulatory Visit (HOSPITAL_COMMUNITY): Payer: Medicaid Other

## 2018-05-23 DIAGNOSIS — D509 Iron deficiency anemia, unspecified: Secondary | ICD-10-CM

## 2018-05-23 NOTE — Telephone Encounter (Signed)
Left message for patient to return call regarding following up with Dr. Lindi Adie.

## 2018-05-24 ENCOUNTER — Telehealth: Payer: Self-pay | Admitting: Hematology and Oncology

## 2018-05-24 NOTE — Telephone Encounter (Signed)
Scheduled appt per 2/17 sch message - left message for patient with apt date and time - scheduled next available MD and infusion time

## 2018-05-25 ENCOUNTER — Encounter (HOSPITAL_COMMUNITY): Payer: Self-pay | Admitting: *Deleted

## 2018-05-25 ENCOUNTER — Ambulatory Visit (HOSPITAL_COMMUNITY): Payer: BLUE CROSS/BLUE SHIELD | Attending: Physician Assistant

## 2018-05-25 DIAGNOSIS — D508 Other iron deficiency anemias: Secondary | ICD-10-CM | POA: Diagnosis present

## 2018-05-25 DIAGNOSIS — Q250 Patent ductus arteriosus: Secondary | ICD-10-CM | POA: Diagnosis present

## 2018-05-25 DIAGNOSIS — R9431 Abnormal electrocardiogram [ECG] [EKG]: Secondary | ICD-10-CM | POA: Diagnosis present

## 2018-05-25 DIAGNOSIS — Q210 Ventricular septal defect: Secondary | ICD-10-CM | POA: Diagnosis present

## 2018-05-25 DIAGNOSIS — R0600 Dyspnea, unspecified: Secondary | ICD-10-CM | POA: Insufficient documentation

## 2018-05-25 NOTE — Progress Notes (Signed)
Patient ID: Tammie Wade, female   DOB: 1995-10-02, 23 y.o.   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  Results taken to DOD Dr. Burt Knack, and given her history he feels it is safe to release Tammie Wade to follow up with Referring provider.   Deliah Boston, RDCS

## 2018-05-26 ENCOUNTER — Telehealth: Payer: Self-pay | Admitting: Hematology and Oncology

## 2018-05-26 NOTE — Telephone Encounter (Signed)
Called patient per 2/20 sch message - left message for patient - unable to reschedule due to MD availability.

## 2018-05-27 ENCOUNTER — Inpatient Hospital Stay: Payer: BLUE CROSS/BLUE SHIELD | Attending: Hematology and Oncology

## 2018-05-27 ENCOUNTER — Other Ambulatory Visit: Payer: Self-pay

## 2018-05-27 ENCOUNTER — Telehealth: Payer: Self-pay | Admitting: Hematology and Oncology

## 2018-05-27 ENCOUNTER — Encounter: Payer: Self-pay | Admitting: Obstetrics & Gynecology

## 2018-05-27 ENCOUNTER — Ambulatory Visit: Payer: Medicaid Other | Admitting: Obstetrics & Gynecology

## 2018-05-27 VITALS — BP 110/66 | HR 86 | Resp 14 | Ht 64.0 in | Wt 124.1 lb

## 2018-05-27 DIAGNOSIS — N920 Excessive and frequent menstruation with regular cycle: Secondary | ICD-10-CM | POA: Diagnosis not present

## 2018-05-27 DIAGNOSIS — H9190 Unspecified hearing loss, unspecified ear: Secondary | ICD-10-CM | POA: Insufficient documentation

## 2018-05-27 DIAGNOSIS — E611 Iron deficiency: Secondary | ICD-10-CM | POA: Diagnosis not present

## 2018-05-27 DIAGNOSIS — J449 Chronic obstructive pulmonary disease, unspecified: Secondary | ICD-10-CM | POA: Diagnosis not present

## 2018-05-27 DIAGNOSIS — R5383 Other fatigue: Secondary | ICD-10-CM | POA: Insufficient documentation

## 2018-05-27 DIAGNOSIS — D500 Iron deficiency anemia secondary to blood loss (chronic): Secondary | ICD-10-CM

## 2018-05-27 DIAGNOSIS — R0602 Shortness of breath: Secondary | ICD-10-CM | POA: Insufficient documentation

## 2018-05-27 DIAGNOSIS — Z79899 Other long term (current) drug therapy: Secondary | ICD-10-CM | POA: Insufficient documentation

## 2018-05-27 DIAGNOSIS — D509 Iron deficiency anemia, unspecified: Secondary | ICD-10-CM | POA: Insufficient documentation

## 2018-05-27 DIAGNOSIS — D6942 Congenital and hereditary thrombocytopenia purpura: Secondary | ICD-10-CM | POA: Insufficient documentation

## 2018-05-27 MED ORDER — NORGESTREL-ETHINYL ESTRADIOL 0.3-30 MG-MCG PO TABS: 1.0000 | ORAL_TABLET | Freq: Every day | ORAL | 4 refills | Status: DC

## 2018-05-27 NOTE — Telephone Encounter (Signed)
Called patient per VM scheduling log.  Patient did not answer.

## 2018-05-27 NOTE — Progress Notes (Signed)
GYNECOLOGY  VISIT  CC:   Follow up after having anemia  HPI: 23 y.o. G0P0000 Single Black or African American female here for follow up from ED on 05-19-2018 for symptomatic anemia.  Pt is on OCPs but cycles have changed over the past few months and is she having very heavy bleeding lasting 6-7 days with clots and soaking through pads.  She had ultrasound in the fall due to irregular bleeding with hematometra noted.  D&C with hysteroscopy was performed.  Pap smear was obtained at the same time.  Pap was negative and endometrial curettage was normal as well.   Iron infusion is scheduled for 05/30/2018 and she will see Dr. Lindi Adie the same day.   D/w pt and her mother options.  Depo provera, Nexplanon, Continuous active OCPs, high dosed progesterones, and IUDs discussed.  Pt's mother declines almost all of these--Depo Provera (used previously but she can't recall why they stopped it), Nexplanon (doesn't like the idea of something under the skin), high dosed progesterone (thinks same thing that happened with Depo Provera that she can't remember might happen with this), IUD (doesn't want daughter to use this).  This really only leaves continuous active OCPs.    Pt will start by trying to use 6 weeks together and then have a cycle AND give me update about bleeding.  Reviewed with pt and mother directions and both are clear about this.    Honestly, feel that Depo Provera monthly for 3 months would work better.  Reviewed with both of them my suggestion but they decline.    GYNECOLOGIC HISTORY: Patient's last menstrual period was 05/13/2018. Contraception: OCP Menopausal hormone therapy: none  Patient Active Problem List   Diagnosis Date Noted  . Anemia 05/19/2018  . Iron deficiency anemia 05/19/2018  . 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 Weight loss 05/19/2018  . S/P PDA repair 01/02/2018  . Scoliosis 01/02/2018  . VSD (ventricular septal defect) 08/26/2017  . PDA (patent ductus  arteriosus) 08/26/2017  . Family history of tuberous sclerosis 09/23/2016  . Menorrhagia with irregular cycle 11/20/2015  . 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 COPD, severe ([place]) 07/12/2015  . Emphysema lung ([place]) 07/12/2015  . GERD (gastroesophageal reflux disease) 07/12/2015  . Congenital thrombocytopenia ([place]) 07/10/2015  . Asthma, mild intermittent 01/02/2004  . 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 Difficulty hearing 06/22/2003  . ASD (atrial septal defect), ostium secundum 01/06/1994    Past Medical History:  Diagnosis Date  . Abnormal uterine bleeding   . ASD (atrial septal defect)   . Blood transfusion without reported diagnosis    [facility] yrs at [facility] in [place] -4 units  . Bronchopulmonary dysplasia   . Chronic lung disease   . Congenital thrombocytopenia ([place])   . COPD (chronic obstructive pulmonary disease) ([place])   . Emphysema lung ([place])   . Emphysema of lung ([place])   . 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 History of seizures   . Learning disabilities   . Meconium aspiration   . Prediabetes   . S/P repair of PDA   . Thrombocytopenia ([place])   . VSD (ventricular septal defect)     Past Surgical History:  Procedure Laterality Date  . 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 DILATATION & CURETTAGE/HYSTEROSCOPY WITH MYOSURE N/A 01/24/2018   Procedure: DILATATION &  CURETTAGE/HYSTEROSCOPY , PAP SMEAR;  Surgeon: Megan Salon, MD;  Location: [facility];  Service: Gynecology;  Laterality: N/A;  Possible Myosure  . EYE SURGERY Bilateral    x 2 weak muscles  . PATENT DUCTUS ARTERIOUS REPAIR     newborn  . VSD REPAIR     newborn    MEDS:   Current Outpatient Medications on File Prior to Visit  Medication Sig Dispense Refill  . albuterol (PROVENTIL HFA;VENTOLIN HFA) 108 (90 Base) MCG/ACT inhaler Inhale 2 puffs into the lungs every 6 (six) hours as needed for  wheezing or shortness of breath.    . doxycycline (VIBRA-TABS) 100 MG tablet Take 1 tablet (100 mg total) by mouth 2 (two) times daily. 14 tablet 0  . Multiple Vitamin (MULTIVITAMIN WITH MINERALS) TABS tablet Take 1 tablet by mouth daily.     . norgestrel-ethinyl estradiol (LOW-OGESTREL) 0.3-30 MG-MCG tablet Take 1 tablet by mouth daily. 84 tablet 4  . umeclidinium bromide (INCRUSE ELLIPTA) 62.5 MCG/INH AEPB Inhale 1 puff into the lungs daily. 30 each 1   No current facility-administered medications on file prior to visit.     ALLERGIES: Patient has no known allergies.  Family History  Problem Relation Age of Onset  . Osteopenia Mother   . Hypertension Mother   . Diabetes Mother   . Breast cancer Maternal Aunt   . Cancer Maternal Aunt   . Hyperlipidemia Maternal Aunt   . Diabetes Maternal Aunt   . Lung cancer Maternal Grandmother   . Hyperlipidemia Maternal Grandmother   . Diabetes Maternal Grandmother   . COPD Maternal Grandmother   . Emphysema Maternal Grandmother   . Sickle cell anemia Maternal Grandmother   . Hyperlipidemia Maternal Grandfather   . Diabetes Maternal Grandfather   . Hypertension Father   . Hyperlipidemia Father   . Sickle cell anemia Maternal Uncle   . Rheum arthritis Cousin     SH:  Single, non smoker  Review of Systems  Constitutional: Positive for unexpected weight change.       Weight loss  All other systems reviewed and are negative.   PHYSICAL EXAMINATION:    BP 110/66 (BP Location: Right Arm, Patient Position: Sitting, Cuff Size: Normal)   Pulse 86   Resp 14   Ht _0  (1.626 m)   Wt 124 lb 1.6 oz (56.3 kg)   LMP 05/13/2018   BMI 21.30 kg/m     General appearance: alert, cooperative and appears stated age Neck: no adenopathy, supple, symmetrical, trachea midline and thyroid normal to inspection and palpation CV:  Regular rate and rhythm Lungs:  clear to auscultation, no wheezes, rales or rhonchi, symmetric air entry Abdomen: soft,  non-tender; bowel sounds normal; no masses,  no organomegaly Lymph:  no inguinal LAD noted  Pelvic: Not performed at this is very difficult for pt to have completed.   Chaperone was present for exam.  Assessment: Menorrhagia with associated iron deficiency anemia S/P blood transfusions 2/20 due to anemia Iron deficiency H/o congential thrombocytopenia  Plan: Will start continuous active OCPs with 6 weeks of active medications together.  Rx to pharmacy. She has follow up on Monday with hematology for iron infusion.  Will watch for lab work as well. Referral to pulmonology per pt's mother placed today Plan recheck after pt is seen next week with hematology.   ~30 minutes spent with patient >50% of time was in face to face discussion of above.

## 2018-05-30 ENCOUNTER — Telehealth: Payer: Self-pay

## 2018-05-30 ENCOUNTER — Inpatient Hospital Stay (HOSPITAL_BASED_OUTPATIENT_CLINIC_OR_DEPARTMENT_OTHER): Payer: BLUE CROSS/BLUE SHIELD | Admitting: Hematology and Oncology

## 2018-05-30 ENCOUNTER — Inpatient Hospital Stay: Payer: BLUE CROSS/BLUE SHIELD

## 2018-05-30 VITALS — BP 131/47 | HR 83 | Temp 98.9°F | Resp 18 | Ht 64.0 in | Wt 122.8 lb

## 2018-05-30 DIAGNOSIS — N920 Excessive and frequent menstruation with regular cycle: Secondary | ICD-10-CM

## 2018-05-30 DIAGNOSIS — Z79899 Other long term (current) drug therapy: Secondary | ICD-10-CM | POA: Diagnosis not present

## 2018-05-30 DIAGNOSIS — H9190 Unspecified hearing loss, unspecified ear: Secondary | ICD-10-CM | POA: Diagnosis not present

## 2018-05-30 DIAGNOSIS — D509 Iron deficiency anemia, unspecified: Secondary | ICD-10-CM | POA: Diagnosis not present

## 2018-05-30 DIAGNOSIS — D6942 Congenital and hereditary thrombocytopenia purpura: Secondary | ICD-10-CM

## 2018-05-30 DIAGNOSIS — R0602 Shortness of breath: Secondary | ICD-10-CM | POA: Diagnosis not present

## 2018-05-30 DIAGNOSIS — R5383 Other fatigue: Secondary | ICD-10-CM | POA: Diagnosis not present

## 2018-05-30 DIAGNOSIS — R931 Abnormal findings on diagnostic imaging of heart and coronary circulation: Secondary | ICD-10-CM

## 2018-05-30 DIAGNOSIS — Q2549 Other congenital malformations of aorta: Secondary | ICD-10-CM

## 2018-05-30 LAB — CBC WITH DIFFERENTIAL (CANCER CENTER ONLY)
Abs Immature Granulocytes: 0.02 10*3/uL (ref 0.00–0.07)
BASOS PCT: 0 %
Basophils Absolute: 0 10*3/uL (ref 0.0–0.1)
Eosinophils Absolute: 0 10*3/uL (ref 0.0–0.5)
Eosinophils Relative: 1 %
HCT: 28.7 % — ABNORMAL LOW (ref 36.0–46.0)
Hemoglobin: 8.1 g/dL — ABNORMAL LOW (ref 12.0–15.0)
Immature Granulocytes: 0 %
Lymphocytes Relative: 16 %
Lymphs Abs: 0.9 10*3/uL (ref 0.7–4.0)
MCH: 22 pg — ABNORMAL LOW (ref 26.0–34.0)
MCHC: 28.2 g/dL — ABNORMAL LOW (ref 30.0–36.0)
MCV: 78 fL — ABNORMAL LOW (ref 80.0–100.0)
Monocytes Absolute: 0.5 10*3/uL (ref 0.1–1.0)
Monocytes Relative: 9 %
Neutro Abs: 4.2 10*3/uL (ref 1.7–7.7)
Neutrophils Relative %: 74 %
Platelet Count: 129 10*3/uL — ABNORMAL LOW (ref 150–400)
RBC: 3.68 MIL/uL — ABNORMAL LOW (ref 3.87–5.11)
RDW: 18.5 % — ABNORMAL HIGH (ref 11.5–15.5)
WBC Count: 5.6 10*3/uL (ref 4.0–10.5)
nRBC: 0 % (ref 0.0–0.2)

## 2018-05-30 LAB — SAMPLE TO BLOOD BANK

## 2018-05-30 NOTE — Progress Notes (Signed)
Patient Care Team: [name], NP-C as PCP - General (Family Medicine)  DIAGNOSIS:  Encounter Diagnoses  Name Primary?  . Congenital thrombocytopenia ([place])   . Iron deficiency anemia, unspecified iron deficiency anemia type Yes     CHIEF COMPLIANT: Severe iron deficiency anemia from recent hospitalization as a result of heavy uterine bleeding  INTERVAL HISTORY: Tammie Wade is a 23 year old lady with previous congenital thrombocytopenia who recently was hospitalized with severe anemia with a hemoglobin as down to 6 g and required blood transfusion and a dose of IV iron and was discharged from the hospital to be seen by [place] for further IV iron therapy.  She is accompanied today by her mother.  She reports that she is feeling a little bit better from previously with less shortness of breath to minimal exertion.  She was apparently diagnosed with COPD and emphysema as well. Severe generalized fatigue but no significant shortness of breath to exertion or palpitations or dizziness.  REVIEW OF SYSTEMS:   Constitutional: Denies fevers, chills or abnormal weight loss Eyes: Denies blurriness of vision Ears, nose, mouth, throat, and face: Denies mucositis or sore throat Respiratory: Denies cough, dyspnea or wheezes Cardiovascular: Denies palpitation, chest discomfort Gastrointestinal:  Denies nausea, heartburn or change in bowel habits Skin: Denies abnormal skin rashes Lymphatics: Denies new lymphadenopathy or easy bruising Neurological:Denies numbness, tingling or new weaknesses Behavioral/Psych: Mood is stable, no new changes  Extremities: No lower extremity edema   All other systems were reviewed with the patient and are negative.  I have reviewed the past medical history, past surgical history, social history and family history with the patient and they are unchanged from previous note.  ALLERGIES:  has No Known Allergies.  MEDICATIONS:  Current Outpatient Medications    Medication Sig Dispense Refill  . albuterol (PROVENTIL HFA;VENTOLIN HFA) 108 (90 Base) MCG/ACT inhaler Inhale 2 puffs into the lungs every 6 (six) hours as needed for wheezing or shortness of breath.    . doxycycline (VIBRA-TABS) 100 MG tablet Take 1 tablet (100 mg total) by mouth 2 (two) times daily. 14 tablet 0  . Multiple Vitamin (MULTIVITAMIN WITH MINERALS) TABS tablet Take 1 tablet by mouth daily.     . norgestrel-ethinyl estradiol (LOW-OGESTREL) 0.3-30 MG-MCG tablet Take 1 tablet by mouth daily. Skipping placebo pills.  She is taking continuous active OCPs. 84 tablet 4  . umeclidinium bromide (INCRUSE ELLIPTA) 62.5 MCG/INH AEPB Inhale 1 puff into the lungs daily. 30 each 1   No current facility-administered medications for this visit.     PHYSICAL EXAMINATION: ECOG PERFORMANCE STATUS: 1 - Symptomatic but completely ambulatory  Vitals:   05/30/18 1533  BP: (!) 131/47  Pulse: 83  Resp: 18  Temp: 98.9 F (37.2 C)  SpO2: 100%   Filed Weights   05/30/18 1533  Weight: 122 lb 12.8 oz (55.7 kg)    GENERAL:alert, no distress and comfortable SKIN: skin color, texture, turgor are normal, no rashes or significant lesions EYES: normal, Conjunctiva are pink and non-injected, sclera clear OROPHARYNX:no exudate, no erythema and lips, buccal mucosa, and tongue normal  NECK: supple, thyroid normal size, non-tender, without nodularity LYMPH:  no palpable lymphadenopathy in the cervical, axillary or inguinal LUNGS: clear to auscultation and percussion with normal breathing effort HEART: regular rate & rhythm and no murmurs and no lower extremity edema ABDOMEN:abdomen soft, non-tender and normal bowel sounds MUSCULOSKELETAL:no cyanosis of digits and no clubbing  NEURO: alert & oriented x 3 with fluent speech, no  focal motor/sensory deficits EXTREMITIES: No lower extremity edema   LABORATORY DATA:  I have reviewed the data as listed CMP Latest Ref Rng & Units 05/20/2018 05/19/2018 04/28/2018   Glucose 70 - 99 mg/dL 93 70 91  BUN 6 - 20 mg/dL _0 Creatinine 0.44 - 1.00 mg/dL 0.60 0.62 0.56  Sodium 135 - 145 mmol/L 135 137 137  Potassium 3.5 - 5.1 mmol/L 4.0 3.8 4.1  Chloride 98 - 111 mmol/L 106 108 108  CO2 22 - 32 mmol/L 19(L) 22 19(L)  Calcium 8.9 - 10.3 mg/dL 8.4(L) 9.0 8.9  Total Protein 6.5 - 8.1 g/dL 6.4(L) 7.7 -  Total Bilirubin 0.3 - 1.2 mg/dL 1.2 1.5(H) -  Alkaline Phos 38 - 126 U/L 20(L) 24(L) -  AST 15 - 41 U/L 21 27 -  ALT 0 - 44 U/L 21 27 -    Lab Results  Component Value Date   WBC 5.6 05/30/2018   HGB 8.1 (L) 05/30/2018   HCT 28.7 (L) 05/30/2018   MCV 78.0 (L) 05/30/2018   PLT 129 (L) 05/30/2018   NEUTROABS 4.2 05/30/2018    ASSESSMENT & PLAN:  Congenital thrombocytopenia ([place]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 She tells me that her platelet counts ranged from 60-90,000. Her problems with bleeding were severe during menarche but have subsequently improved after being treated with birth control pills. She does not have any current problems with bruising or bleeding.  Prior workup: An extensive workup by Dr. Melvenia Beam (in [place]) in the past showed no qualitative platelet defect (normal platelet aggregation testing) and no evidence of ITP. Bone marrow testing in the past has shown normal megakaryocytes, and there are no other findings on her CBC today suggest new marrow dysplasias.   Differential diagnosisfor congenital thrombocytopenia as is extremely wide which include amegakaryocytic thrombocytopenia especially because of the fact that she has hearing impairment. Other X-linked thrombocytopenias are also possible as well as MYH-9 -related diseases.  Iron deficiency anemia due to heavy uterine bleeding  Hospitalization 05/19/2018-05/20/2018 for menorrhagia with a hemoglobin of 6.3, received blood transfusions and 1 dose of IV iron infusion Lab review: 05/19/2018: Ferritin  4, hemoglobin 6.9, MCV 72, platelets 90 05/30/2018: Hemoglobin 8.1, MCV 78, platelets 129  Recommendation: IV iron therapy, 2 doses 1 week apart GYN follow-up regarding the menorrhagia.   Recheck CBC and follow-up iron studies in 1 month and follow-up after the IV iron therapy.    Orders Placed This Encounter  Procedures  . Ferritin    Standing Status:   Future    Standing Expiration Date:   05/30/2019  . Iron and TIBC    Standing Status:   Future    Standing Expiration Date:   05/30/2019  . CBC with Differential (Cancer Center Only)    Standing Status:   Future    Standing Expiration Date:   05/31/2019  . CBC with Differential (Cancer Center Only)    Standing Status:   Future    Number of Occurrences:   1    Standing Expiration Date:   05/31/2019  . Ferritin    Standing Status:   Future    Number of Occurrences:   1    Standing Expiration Date:   05/30/2019  . Iron and TIBC    Standing Status:   Future    Number of Occurrences:   1    Standing Expiration Date:   05/30/2019  . Sample to  Blood Bank    Standing Status:   Future    Number of Occurrences:   1    Standing Expiration Date:   05/31/2019   The patient has a good understanding of the overall plan. she agrees with it. she will call with any problems that may develop before the next visit here.   Harriette Ohara, MD 05/30/18

## 2018-05-30 NOTE — Assessment & Plan Note (Signed)
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 She tells me that her platelet counts ranged from 60-90,000. Her problems with bleeding were severe during menarche but have subsequently improved after being treated with birth control pills. She does not have any current problems with bruising or bleeding.  Blood work review:platelet count is82 on 07/28/2016  Prior workup: An extensive workup by Dr. Melvenia Beam (in [place]) in the past showed no qualitative platelet defect (normal platelet aggregation testing) and no evidence of ITP. Bone marrow testing in the past has shown normal megakaryocytes, and there are no other findings on her CBC today suggest new marrow dysplasias.   Differential diagnosisfor congenital thrombocytopenia as is extremely wide which include amegakaryocytic thrombocytopenia especially because of the fact that she has hearing impairment. Other X-linked thrombocytopenias are also possible as well as MYH-9 -related diseases.  Hospitalization 05/19/2018-05/20/2018 for menorrhagia with a hemoglobin of 6.3, received blood transfusions and 1 dose of IV iron infusion Lab review: 05/19/2018: Ferritin 4, hemoglobin 6.9, MCV 72, platelets 90 Recommendation: IV iron therapy GYN follow-up regarding the menorrhagia. Although the platelet count is low it is not severely low to require platelet transfusions.  Recheck CBC and follow-up iron studies in 3 months.

## 2018-05-30 NOTE — Telephone Encounter (Signed)
  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 Message sent to Dr. Cyndia Bent for review. Per Dr. 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 Per Dr. Cyndia Bent, patient should probably have a gated cardiac CT to evaluate the aortic root thoroughly and get precise measurement. Dr. Cyndia Bent will be happy to see her.   Called and made patient and her mother aware of echo results and recommendations for Cardiac CT for further evaluation. Cardiac CT ordered and scheduled for 2/28 at 3:00 PM. Patient with recent BMET on file. Appointment with Robbie Lis, PA cancelled and referral placed to [facility]. Will call patient and her mother tomorrow to review pre Cardiac CT instructions.

## 2018-05-31 ENCOUNTER — Telehealth: Payer: Self-pay | Admitting: Hematology and Oncology

## 2018-05-31 LAB — IRON AND TIBC
Iron: 14 ug/dL — ABNORMAL LOW (ref 41–142)
Saturation Ratios: 3 % — ABNORMAL LOW (ref 21–57)
TIBC: 514 ug/dL — ABNORMAL HIGH (ref 236–444)
UIBC: 500 ug/dL — ABNORMAL HIGH (ref 120–384)

## 2018-05-31 LAB — FERRITIN: Ferritin: 7 ng/mL — ABNORMAL LOW (ref 11–307)

## 2018-05-31 MED ORDER — METOPROLOL TARTRATE 50 MG PO TABS: ORAL_TABLET | ORAL | 0 refills | Status: DC

## 2018-05-31 NOTE — Telephone Encounter (Signed)
Discussed with Dr. [name] and patient will take 50 mg of lopressor prior to Cardiac CT.   Discussed information below the the patient's mother (DPR on file). She verbalized understanding to all instruction. She states that she will be present with patient at the appointment on 06/08/18 with Dr. Cyndia Bent and she is going to work on getting records from [place].    Please arrive at the [facility] main entrance of [facility] on 06/03/18 at 3:30 PM (30-45 minutes prior to test start time)  [facility] [place], [place] [address] [phone]  Proceed to the [facility] Radiology Department (First Floor).  Please follow these instructions carefully (unless otherwise directed):   On the Night Before the Test: . Be sure to Drink plenty of water. . Do not consume any caffeinated/decaffeinated beverages or chocolate 12 hours prior to your test. . Do not take any antihistamines 12 hours prior to your test.  On the Day of the Test: . Drink plenty of water. Do not drink any water within one hour of the test. . Do not eat any food 4 hours prior to the test. . You may take your regular medications prior to the test.  . Take metoprolol (Lopressor) two hours prior to test.     After the Test: . Drink plenty of water. . After receiving IV contrast, you may experience a mild flushed feeling. This is normal. . On occasion, you may experience a mild rash up to 24 hours after the test. This is not dangerous. If this occurs, you can take Benadryl 25 mg and increase your fluid intake. . If you experience trouble breathing, this can be serious. If it is severe call 911 IMMEDIATELY. If it is mild, please call our office.

## 2018-05-31 NOTE — Telephone Encounter (Signed)
Scheduled appt per 2/24 sch message - pt is aware of appt date and time

## 2018-06-01 ENCOUNTER — Telehealth (HOSPITAL_COMMUNITY): Payer: Self-pay | Admitting: Emergency Medicine

## 2018-06-01 NOTE — Telephone Encounter (Signed)
Reaching out to patient to offer assistance regarding upcoming cardiac imaging study; pt verbalizes understanding of appt date/time, parking situation and where to check in, pre-test NPO status and medications ordered, and verified current allergies; name and call back number provided for further questions should they arise Darneshia Demary RN Navigator Cardiac Imaging [facility] [phone] office [phone] cell 

## 2018-06-02 ENCOUNTER — Ambulatory Visit: Payer: Medicaid Other | Admitting: Physician Assistant

## 2018-06-03 ENCOUNTER — Ambulatory Visit (HOSPITAL_COMMUNITY): Admission: RE | Admit: 2018-06-03 | Payer: BLUE CROSS/BLUE SHIELD | Source: Ambulatory Visit

## 2018-06-03 ENCOUNTER — Ambulatory Visit (HOSPITAL_COMMUNITY)
Admission: RE | Admit: 2018-06-03 | Discharge: 2018-06-03 | Disposition: A | Discharge status: Home / Self Care | Payer: BLUE CROSS/BLUE SHIELD | Source: Ambulatory Visit | Attending: Interventional Cardiology | Admitting: Interventional Cardiology

## 2018-06-03 DIAGNOSIS — R931 Abnormal findings on diagnostic imaging of heart and coronary circulation: Secondary | ICD-10-CM | POA: Diagnosis present

## 2018-06-03 DIAGNOSIS — R9431 Abnormal electrocardiogram [ECG] [EKG]: Secondary | ICD-10-CM | POA: Diagnosis not present

## 2018-06-03 DIAGNOSIS — Q2549 Other congenital malformations of aorta: Secondary | ICD-10-CM

## 2018-06-03 MED ORDER — NITROGLYCERIN 0.4 MG SL SUBL
SUBLINGUAL_TABLET | SUBLINGUAL | Status: AC
  Filled 2018-06-03: qty 2

## 2018-06-03 MED ORDER — NITROGLYCERIN 0.4 MG SL SUBL
0.8000 mg | SUBLINGUAL_TABLET | Freq: Once | SUBLINGUAL | Status: AC
  Administered 2018-06-03: 0.8 mg via SUBLINGUAL
  Filled 2018-06-03: qty 25

## 2018-06-03 MED ORDER — METOPROLOL TARTRATE 5 MG/5ML IV SOLN
INTRAVENOUS | Status: AC
  Filled 2018-06-03: qty 10

## 2018-06-03 MED ORDER — IOPAMIDOL (ISOVUE-370) INJECTION 76%
80.0000 mL | Freq: Once | INTRAVENOUS | Status: AC | PRN
  Administered 2018-06-03: 80 mL via INTRAVENOUS

## 2018-06-06 ENCOUNTER — Other Ambulatory Visit: Payer: Self-pay | Admitting: Hematology and Oncology

## 2018-06-06 ENCOUNTER — Inpatient Hospital Stay: Payer: BLUE CROSS/BLUE SHIELD | Admitting: Hematology and Oncology

## 2018-06-06 ENCOUNTER — Inpatient Hospital Stay: Payer: BLUE CROSS/BLUE SHIELD | Attending: Hematology and Oncology

## 2018-06-06 ENCOUNTER — Inpatient Hospital Stay: Payer: BLUE CROSS/BLUE SHIELD

## 2018-06-06 VITALS — BP 115/51 | HR 88 | Temp 99.1°F | Resp 16

## 2018-06-06 DIAGNOSIS — D509 Iron deficiency anemia, unspecified: Secondary | ICD-10-CM

## 2018-06-06 DIAGNOSIS — D6942 Congenital and hereditary thrombocytopenia purpura: Secondary | ICD-10-CM | POA: Insufficient documentation

## 2018-06-06 LAB — CBC WITH DIFFERENTIAL (CANCER CENTER ONLY)
Abs Immature Granulocytes: 0.02 10*3/uL (ref 0.00–0.07)
Basophils Absolute: 0 10*3/uL (ref 0.0–0.1)
Basophils Relative: 0 %
Eosinophils Absolute: 0 10*3/uL (ref 0.0–0.5)
Eosinophils Relative: 0 %
HCT: 29.4 % — ABNORMAL LOW (ref 36.0–46.0)
Hemoglobin: 8.3 g/dL — ABNORMAL LOW (ref 12.0–15.0)
Immature Granulocytes: 0 %
LYMPHS PCT: 15 %
Lymphs Abs: 0.8 10*3/uL (ref 0.7–4.0)
MCH: 21.7 pg — ABNORMAL LOW (ref 26.0–34.0)
MCHC: 28.2 g/dL — ABNORMAL LOW (ref 30.0–36.0)
MCV: 77 fL — ABNORMAL LOW (ref 80.0–100.0)
Monocytes Absolute: 0.7 10*3/uL (ref 0.1–1.0)
Monocytes Relative: 13 %
Neutro Abs: 3.7 10*3/uL (ref 1.7–7.7)
Neutrophils Relative %: 72 %
Platelet Count: 118 10*3/uL — ABNORMAL LOW (ref 150–400)
RBC: 3.82 MIL/uL — ABNORMAL LOW (ref 3.87–5.11)
RDW: 18.6 % — ABNORMAL HIGH (ref 11.5–15.5)
WBC Count: 5.2 10*3/uL (ref 4.0–10.5)
nRBC: 0 % (ref 0.0–0.2)

## 2018-06-06 LAB — FERRITIN: FERRITIN: 4 ng/mL — AB (ref 11–307)

## 2018-06-06 LAB — IRON AND TIBC
Iron: 16 ug/dL — ABNORMAL LOW (ref 41–142)
Saturation Ratios: 3 % — ABNORMAL LOW (ref 21–57)
TIBC: 531 ug/dL — ABNORMAL HIGH (ref 236–444)
UIBC: 515 ug/dL — ABNORMAL HIGH (ref 120–384)

## 2018-06-06 MED ORDER — SODIUM CHLORIDE 0.9 % IV SOLN
Freq: Once | INTRAVENOUS | Status: AC
  Administered 2018-06-06: 09:00:00 via INTRAVENOUS
  Filled 2018-06-06: qty 250

## 2018-06-06 MED ORDER — SODIUM CHLORIDE 0.9 % IV SOLN
510.0000 mg | Freq: Once | INTRAVENOUS | Status: AC
  Administered 2018-06-06: 510 mg via INTRAVENOUS
  Filled 2018-06-06: qty 17

## 2018-06-06 NOTE — Assessment & Plan Note (Addendum)
Patient had previously been evaluated in [place] and subsequently in [place]. She tells me that her platelet counts ranged from 60-90,000. Her problems with bleeding were severe during menarche but have subsequently improved after being treated with birth control pills. She does not have any current problems with bruising or bleeding.  Prior workup: An extensive workup by Dr. Melvenia Beam (in [place]) in the past showed no qualitative platelet defect (normal platelet aggregation testing) and no evidence of ITP. Bone marrow testing in the past has shown normal megakaryocytes, and there are no other findings on her CBC today suggest new marrow dysplasias.   Differential diagnosisfor congenital thrombocytopenia as is extremely wide which include amegakaryocytic thrombocytopenia especially because of the fact that she has hearing impairment. Other X-linked thrombocytopenias are also possible as well as MYH-9 -related diseases.  Plan: Monitoring blood work

## 2018-06-06 NOTE — Patient Instructions (Signed)

## 2018-06-06 NOTE — Assessment & Plan Note (Signed)
Hospitalization 05/19/2018-05/20/2018 for menorrhagia with a hemoglobin of 6.3, received blood transfusions and 1 dose of IV iron infusion Lab review: 05/19/2018: Ferritin 4, hemoglobin 6.9, MCV 72, platelets 90 05/30/2018: Hemoglobin 8.1, MCV 78, platelets 129  Recommendation: IV iron therapy, 2 doses 1 week apart GYN follow-up regarding the menorrhagia.   Recheck CBC and follow-up iron studies in 1 month and follow-up after the IV iron therapy.

## 2018-06-08 ENCOUNTER — Encounter: Payer: Self-pay | Admitting: Surgery

## 2018-06-08 ENCOUNTER — Institutional Professional Consult (permissible substitution): Payer: BLUE CROSS/BLUE SHIELD | Admitting: Surgery

## 2018-06-08 ENCOUNTER — Other Ambulatory Visit: Payer: Self-pay

## 2018-06-08 VITALS — BP 107/63 | HR 55 | Resp 16 | Ht 66.0 in | Wt 121.0 lb

## 2018-06-08 DIAGNOSIS — Q2549 Other congenital malformations of aorta: Secondary | ICD-10-CM | POA: Diagnosis not present

## 2018-06-08 DIAGNOSIS — D6942 Congenital and hereditary thrombocytopenia purpura: Secondary | ICD-10-CM

## 2018-06-08 DIAGNOSIS — D509 Iron deficiency anemia, unspecified: Secondary | ICD-10-CM

## 2018-06-09 ENCOUNTER — Encounter: Payer: Self-pay | Admitting: Surgery

## 2018-06-09 NOTE — Progress Notes (Signed)
Cardiothoracic Surgery Consultation  PCP is [name], NP-C Referring Provider is [name], MD  Chief Complaint  Patient presents with  . Aneurysm    new patient consultation, Valsalva aneurysm... 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  She was initially sent to Dr. [name] in May 2019 for evaluation of an abnormal ECG noted by her primary care provider.  This showed anterior T wave inversions.  This is felt to be due to to her previous cardiac surgery.  She underwent an echocardiogram at that time which showed a mild reduction in left ventricular ejection fraction to 45 to 50% with grade 1 diastolic dysfunction.  There is mild aortic insufficiency.  There is moderate mitral regurgitation.  RV systolic function was mildly to moderately reduced with mild cavity dilation and pulmonary artery systolic pressure was measured at 59 mmHg.  There were no shunts at the atrial or ventricular level.  She presented to the emergency department in January 2020 with gradual onset of cough and worsening shortness of breath of several weeks duration.  She had a CT of the chest which was negative for pulmonary embolism.  It did show a chronically enlarged main pulmonary artery that was 4.9 cm.  The aortic root was felt to be abnormal but incompletely assessed.  There is moderate left ventricular dilation with left ventricular thinning and large right ventricle.  She underwent a repeat echocardiogram on  05/25/2018 which showed a left-ventricular ejection fraction of 40 to 45%.  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  There is mild eccentric aortic insufficiency.  There is moderate mitral regurgitation with a thickened mitral valve suggesting previous repair. Dr. [name] asked me to review her echocardiogram and I then recommended doing a gated cardiac CTA to better visualize the aortic root and sinuses of Valsalva.  This showed aneurysmal dilation of the noncoronary sinus of Valsalva with a measurement of 40 x 31 mm.  This was unchanged compared to a prior CT scan of the chest on 07/23/2015.  The aortic root diameter across the noncoronary sinus was 48 mm.  The sinotubular junction was 38 x 35 mm and the ascending aorta was 40 mm.  The descending thoracic aorta was 24 mm.  The patient is here with her mother today who does most of the talking.  According to her mother they previously lived in [place] where she had her cardiac surgery as an infant.  They have moved around a fair amount living in [place] as well as in [place] before moving to [place] in 2016.  She was previously followed for her congenital heart disease but has not been seen in many years.  Her mother says that her husband has been quite ill over the past several years and just recently died and most of her concentration has been on his health over the past couple years.  The patient and her mother report progressive exertional dyspnea and fatigue  as well as intermittent orthopnea.  She has had no lower extremity edema.  She denies dizziness and syncope.  She has had occasional chest pain.  Review of her medical record shows that her hemoglobin has been 11-12 in the past.  She was seen at the end of January and had a hemoglobin of 8.3 and then presented on 05/18/2018 with shortness of breath and a hemoglobin of 6.9.  She was transfused with a  rise in her hemoglobin to 8.0 and was given intravenous iron and discharge with cardiology follow-up.  She has been followed by Dr. Sonny Dandy for her chronic thrombocytopenia and anemia.  Her OB/GYN is Dr. Hale Bogus.  Past Medical History:  Diagnosis Date  . Abnormal uterine bleeding   . ASD (atrial septal defect)   . Blood transfusion without reported diagnosis    11 yrs at [facility] in [place] -4 units  . Bronchopulmonary dysplasia   . Chronic lung disease   . Congenital thrombocytopenia ([place])   . COPD (chronic obstructive pulmonary disease) ([place])   . Emphysema lung ([place])   . Emphysema of lung ([place])   . 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 History of seizures   . Learning disabilities   . Meconium aspiration   . Prediabetes   . S/P repair of PDA   . Thrombocytopenia ([place])   . VSD (ventricular septal defect)     Past Surgical History:  Procedure Laterality Date  . CARDIAC SURGERY     open heart at 56 weeks old - bottom 2 chambers of heart were closed  . DILATATION & CURETTAGE/HYSTEROSCOPY WITH MYOSURE N/A 01/24/2018   Procedure: DILATATION & CURETTAGE/HYSTEROSCOPY , PAP SMEAR;  Surgeon: Megan Salon, MD;  Location: [facility];  Service: Gynecology;  Laterality: N/A;  Possible Myosure  . EYE SURGERY Bilateral    x 2 weak muscles  . PATENT DUCTUS ARTERIOUS REPAIR     newborn  . VSD REPAIR     newborn    Family History  Problem Relation Age of Onset  . Osteopenia Mother   . Hypertension Mother   . Diabetes Mother   . Breast cancer Maternal Aunt   . Cancer Maternal Aunt   . Hyperlipidemia Maternal Aunt   . Diabetes Maternal Aunt   . Lung cancer Maternal Grandmother   . Hyperlipidemia Maternal Grandmother   . Diabetes Maternal Grandmother   . COPD Maternal Grandmother   . Emphysema Maternal Grandmother   . Sickle cell anemia Maternal Grandmother   . Hyperlipidemia Maternal Grandfather   . Diabetes Maternal Grandfather   . Hypertension Father     . Hyperlipidemia Father   . Sickle cell anemia Maternal Uncle   . Rheum arthritis Cousin     Social History Social History   Tobacco Use  . Smoking status: Never Smoker  . Smokeless tobacco: Never Used  Substance Use Topics  . Alcohol use: No    Alcohol/week: 0.0 standard drinks  . Drug use: No    Current Outpatient Medications  Medication Sig Dispense Refill  . albuterol (PROVENTIL HFA;VENTOLIN HFA) 108 (90 Base) MCG/ACT inhaler Inhale 2 puffs into the lungs every 6 (six) hours as needed for wheezing or shortness of breath.    . doxycycline (VIBRA-TABS) 100 MG tablet Take 1 tablet (100 mg total) by mouth 2 (two) times daily. 14 tablet 0  . metoprolol tartrate (LOPRESSOR) 50 MG tablet Take 1 tablet 2 hours prior to  Cardiac CT 1 tablet 0  . Multiple Vitamin (MULTIVITAMIN WITH MINERALS) TABS tablet Take 1 tablet by mouth daily.     . norgestrel-ethinyl estradiol (LOW-OGESTREL) 0.3-30 MG-MCG tablet Take 1 tablet by mouth daily. Skipping placebo pills.  She is taking continuous active OCPs. 84 tablet 4  . umeclidinium bromide (INCRUSE ELLIPTA) 62.5 MCG/INH AEPB Inhale 1 puff into the lungs daily. 30 each 1   No current facility-administered medications for this visit.     No Known Allergies  Review of Systems  Constitutional: Positive for activity change, appetite change, fatigue and unexpected weight change. Negative for chills and fever.  HENT:       Sees a dentist regularly  Eyes: Negative.   Respiratory: Positive for chest tightness, shortness of breath and wheezing.   Cardiovascular: Positive for chest pain. Negative for palpitations and leg swelling.  Gastrointestinal: Negative.   Endocrine: Negative.   Genitourinary: Negative.        Heavy menstrual bleeding  Musculoskeletal: Negative.   Skin: Negative.   Neurological: Negative for dizziness, syncope and headaches.  Hematological: Negative.   Psychiatric/Behavioral: Negative.     BP 107/63 (BP Location: Right  Arm, Patient Position: Sitting, Cuff Size: Normal)   Pulse (!) 55   Resp 16   Ht _0  (1.676 m)   Wt 121 lb (54.9 kg)   LMP 05/13/2018   SpO2 97% Comment: RA  BMI 19.53 kg/m  Physical Exam Constitutional:      Appearance: Normal appearance. She is normal weight.  HENT:     Head: Normocephalic and atraumatic.     Mouth/Throat:     Mouth: Mucous membranes are moist.     Pharynx: Oropharynx is clear.  Eyes:     Extraocular Movements: Extraocular movements intact.     Conjunctiva/sclera: Conjunctivae normal.     Pupils: Pupils are equal, round, and reactive to light.  Neck:     Musculoskeletal: Normal range of motion and neck supple.     Vascular: No carotid bruit.  Cardiovascular:     Rate and Rhythm: Normal rate and regular rhythm.     Pulses: Normal pulses.     Comments: 2/6 systolic murmur along the left lower sternal border Pulmonary:     Effort: Pulmonary effort is normal.     Breath sounds: No wheezing or rales.     Comments: Distant breath sounds throughout Abdominal:     General: Abdomen is flat. Bowel sounds are normal. There is no distension.     Palpations: Abdomen is soft.  Musculoskeletal: Normal range of motion.        General: No swelling.  Lymphadenopathy:     Cervical: No cervical adenopathy.  Skin:    General: Skin is warm and dry.  Neurological:     General: No focal deficit present.     Mental Status: She is alert and oriented to person, place, and time.  Psychiatric:        Mood and Affect: Mood normal.        Behavior: Behavior normal.        Thought Content: Thought content normal.        Judgment: Judgment normal.      Diagnostic Tests:   ECHOCARDIOGRAM REPORT       Patient Name:   Tammie Wade Date of Exam: 05/25/2018 Medical Rec #:  [id number]             Height:       66.0 in Accession #:  [id number]            Weight:       124.0 lb Date of Birth:  1995/09/27              BSA:          1.63 m Patient Age:    22 years               BP:           118/64 mmHg Patient Gender: F                     HR:           55 bpm. Exam Location:  [address]    Procedure: 2D Echo, 3D Echo, Cardiac Doppler and Color Doppler  Indications:    R94.31.Abnormal EKG                 R06.00 Dyspnea                 Q21.0 VSD Repair                 Q25.0 PDA Repair   History:        Patient has prior history of Echocardiogram examinations, most                 recent 09/03/2017. COPD. VSD and PDA status Post Repair at 29                 weeks old, Probable Cushion Defect. Chronic Lung Disease.   Sonographer:    Deliah Boston RDCS Referring Phys: [id number] [place]    1. The left ventricle has mild-moderately reduced systolic function, with an ejection fraction of 40-45%. The cavity size was normal. Left ventricular diastolic Doppler parameters are consistent with pseudonormalization Left ventricular diffuse  hypokinesis. No residual VSD noted.  2. The tricuspid valve is normal in structure.  3. The aortic valve is probably trileaflet. There is a 5.5 x 3.7 cm sinus of valsalva aneurysm involving the noncoronary sinus. Mild eccentric regurgitation. No aortic stenosis.  4. The pulmonic valve was normal in structure. Pulmonic valve regurgitation is mild by color flow Doppler.  5. There is mild dilatation of the ascending aorta measuring 41 mm.  6. The mitral valve appears to have been repaired, ?repaired cleft valve. There does not appear to be significant stenosis. There is at least moderate mitral regurgitation, eccentric. If patient is symptomatic, would consider TEE to assess for higher  grade mitral regurgitation.  7. The right ventricle has normal systolic function. The cavity was mildly enlarged. There is mildly increased right ventricular wall thickness.  8. Left atrial size was severely dilated.  9. Right atrial size was mildly dilated. 10. The inferior vena cava was normal in size with <50%  respiratory variability. 11. PA systolic pressure 61 mmHg.  FINDINGS  Left Ventricle: The left ventricle has mild-moderately reduced systolic function, with an ejection fraction of 40-45%. The cavity size was normal. There is no increase in left ventricular wall thickness. Left ventricular diastolic Doppler parameters are  consistent with pseudonormalization Left ventricular diffuse hypokinesis. Right Ventricle: The right ventricle has normal systolic function. The cavity was mildly enlarged. There is mildly increased right ventricular wall thickness. Left Atrium: left atrial size was severely dilated Right Atrium: right atrial size was mildly dilated. Right atrial pressure is estimated at 8 mmHg. Interatrial Septum: No atrial level shunt detected by  color flow Doppler. Pericardium: There is no evidence of pericardial effusion. Mitral Valve: The mitral valve has been repaired/replaced. Mitral valve regurgitation is moderate by color flow Doppler. No evidence of mitral valve stenosis. The mitral valve appears to have been repaired, ?repaired cleft valve. Tricuspid Valve: The tricuspid valve is normal in structure. Tricuspid valve regurgitation is mild by color flow Doppler. Aortic Valve: The aortic valve is tricuspid Aortic valve regurgitation is mild by color flow Doppler. There is no stenosis of the aortic valve, with a calculated valve area of 2.64 cm. The aortic valve is probably trileaflet. There is a 3 x 2.2 cm sinus  of valsalva aneurysm involving the noncoronary sinus. Pulmonic Valve: The pulmonic valve was normal in structure. Pulmonic valve regurgitation is mild by color flow Doppler. Aorta: There is mild dilatation of the ascending aorta measuring 41 mm. Venous: The inferior vena cava is normal in size with less than 50% respiratory variability.   LEFT VENTRICLE PLAX 2D (Teich) LV EF:          45.9 %   Diastology LVIDd:          4.56 cm  LV e' lateral:   10.60 cm/s LVIDs:           3.52 cm  LV E/e' lateral: 13.4 LV PW:          0.83 cm  LV e' medial:    7.62 cm/s LV IVS:         0.84 cm  LV E/e' medial:  18.6 LVOT diam:      2.30 cm LV SV:          44 ml LVOT Area:      4.15 cm  RIGHT VENTRICLE RV S prime:     12.60 cm/s TAPSE (M-mode): 1.7 cm RVSP:           60.7 mmHg  LEFT ATRIUM             Index       RIGHT ATRIUM           Index LA diam:        4.50 cm 2.76 cm/m  RA Pressure: 8 mmHg LA Vol (A2C):   95.0 ml 58.21 ml/m RA Area:     20.60 cm LA Vol (A4C):   76.5 ml 46.87 ml/m RA Volume:   65.50 ml  40.13 ml/m LA Biplane Vol: 86.2 ml 52.81 ml/m  AORTIC VALVE AV Area (Vmax):    2.47 cm AV Area (Vmean):   2.27 cm AV Area (VTI):     2.64 cm AV Vmax:           170.00 cm/s AV Vmean:          113.000 cm/s AV VTI:            0.400 m AV Peak Grad:      11.6 mmHg AV Mean Grad:      6.0 mmHg LVOT Vmax:         101.00 cm/s LVOT Vmean:        61.800 cm/s LVOT VTI:          0.254 m LVOT/AV VTI ratio: 0.64 AR PHT:            422 msec   AORTA Ao Root diam: 3.70 cm Ao Asc diam:  4.10 cm  MITRAL VALVE               TRICUSPID VALVE MV Area (PHT): 1.30 cm  TR Peak grad:   52.7 mmHg MV Peak grad:  14.0 mmHg   TR Vmax:        363.00 cm/s MV Mean grad:  4.0 mmHg    RVSP:           60.7 mmHg MV Vmax:       1.87 m/s MV Vmean:      88.5 cm/s MV VTI:        0.80 m MV PHT:        169.36 msec MV Decel Time: 584 msec MR Peak grad: 118.4 mmHg MR Mean grad: 74.0 mmHg MR Vmax:      544.00 cm/s MR Vmean:     402.0 cm/s MV E velocity: 142.00 cm/s MV A velocity: 130.00 cm/s MV E/A ratio:  1.09    Loralie Champagne MD Electronically signed by Loralie Champagne MD Signature Date/Time: 05/25/2018/5:49:18 PM       Final     ADDENDUM REPORT: 06/06/2018 19:00  CLINICAL DATA:  23 year old female with congenital thrombocytopenia, h/o VSD and PDA repaired at age 41 weeks, now with findings of dilatation of the Sinus of the Valsalva of an  echocardiogram.  EXAM: Cardiac/Coronary  CT  TECHNIQUE: The patient was scanned on a [employer].  FINDINGS: A 120 kV prospective scan was triggered in the descending thoracic aorta at 111 HU's. Axial non-contrast 3 mm slices were carried out through the heart. The data set was analyzed on a dedicated work station and scored using the [place]. Gantry rotation speed was 250 msecs and collimation was .6 mm. No beta blockade and 0.8 mg of sl NTG was given. The 3D data set was reconstructed in 5% intervals of the 67-82 % of the R-R cycle. Diastolic phases were analyzed on a dedicated work station using MPR, MIP and VRT modes. The patient received 80 cc of contrast.  Aorta:  Normal size.  No calcifications.  No dissection.  Sinotubular Junction: 38 x 35 mm  Ascending Thoracic Aorta: 40 x 40 mm (not the entire aorta is visualized)  Aortic Arch: Not visualized.  Descending Thoracic Aorta: 24 x 24 mm  Aortic Valve: Trileaflet. No calcifications. There is aneurysmal dilatation of the non-coronary sinus measuring 40 x 31 mm.  Sinus of Valsalva Measurements:  Non-coronary: 48 mm  Right -coronary: 37 mm  Left -coronary: 38 mm  Coronary Arteries:  Normal coronary origin.  Right dominance.  RCA is a large dominant artery that gives rise to PDA and PLVB. There is no plaque.  Left main is a large artery that gives rise to LAD, ramus intermedius and LCX arteries.  LAD is a large vessel that has no plaque.  Ramus intermedius is a medium size artery without a plaque.  LCX is a non-dominant artery that gives rise to one small OM1 branch. There is no plaque.  Other findings:  Normal pulmonary vein drainage into the left atrium.  Normal let atrial appendage without a thrombus.  Normal size of the pulmonary artery.  IMPRESSION: 1. Coronary calcium score of 0. This was 0 percentile for age and sex matched control.  2. Normal  coronary origin with right dominance.  3. No evidence of CAD.  4. Severely dilated pulmonary artery measuring 47 mm suggestive of pulmonary hypertension.  5. No evidence for VSD or ASD.  6. There is aneurysmal dilatation of the non-coronary sinus measuring 40 x 31 mm. This is unchanged from the prior study on 07/23/2015.   Electronically Signed   By:  Ena Dawley   On: 06/06/2018 19:00   Addended by Dorothy Spark, MD on 06/06/2018 7:03 PM    Study Result   EXAM: OVER-READ INTERPRETATION  CT CHEST  The following report is an over-read performed by radiologist Dr. Aletta Edouard of [facility], [place] on 06/03/2018. This over-read does not include interpretation of cardiac or coronary anatomy or pathology. The coronary CTA interpretation by the cardiologist is attached.  COMPARISON:  CTA of the chest on 04/28/2018  FINDINGS: Vascular: Stable substantially dilated main pulmonary artery measuring up to roughly 4.7 cm. See coronary CTA report for abnormal morphology and dilatation of the aortic root.  Mediastinum/Nodes: Visualized mediastinum and hilar regions show no lymphadenopathy or masses.  Lungs/Pleura: Stable appearance of bronchopulmonary dysplasia of bilateral lower lobes and the right middle lobe and hyperexpanded bilateral upper lobes and lingula with bilateral air trapping. Visualized lungs show no evidence of edema, pneumothorax, nodule or pleural fluid.  Upper Abdomen: No acute abnormality.  Musculoskeletal: No chest wall mass or suspicious bone lesions identified.  IMPRESSION: Stable chronic abnormalities including massively dilated main pulmonary artery and bronchopulmonary dysplasia of both lower lobes and right middle lobe with resultant hyperexpansion and air trapping in both upper lobes and the lingula.  Electronically Signed: By: Aletta Edouard M.D. On: 06/03/2018 15:50        Impression:  This  23 year old woman has several issues going on some of which are interrelated.  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  This was measured at 40 x 31 mm and the aortic root diameter at this level was 48 mm.  Compared to her chest CT scan on 07/23/2015 this is unchanged.  There is mild eccentric aortic insufficiency that is of no consequence at this time.  I do not think she requires surgical treatment at this time for her sinus of Valsalva aneurysm but should be followed because it may enlarge over time and the degree of aortic insufficiency could worsen.  I do not think this sinus of Valsalva aneurysm is causing any of her symptoms and I reviewed the CT and echo images with the patient and her mother and explained that to them.  I think of more importance is her biventricular systolic dysfunction with evidence of severe pulmonary hypertension and a markedly dilated main pulmonary artery at 4.8 cm.  The etiology of her pulmonary hypertension is not completely clear but may be related to her history of bronchopulmonary dysplasia with severe obstructive lung disease and severe reduction in diffusion capacity as well as at least moderate mitral regurgitation and marked anemia.  This is all likely contributing to her symptoms of exertional fatigue and shortness of breath.  It sounds like her symptoms have been progressing over the past year and she could certainly end up with progressively worsening biventricular dysfunction.  She has been seen by a pulmonary medicine physician in 2017 and said that she currently is waiting to hear from another pulmonary medicine physician since the previous physician had left [employer].  It would probably be worthwhile having her seen by a cardiologist who specializes in adult congenital heart disease to help with further work-up and medical management.  I recommended that she have a repeat gated cardiac CTA in 1 year to follow-up on her sinus of  Valsalva aneurysm since this gives the most accurate measurement at this level.  She should probably also have an echocardiogram yearly to follow her left and right ventricular function as well  as her degree of AI and MR.   Plan:  She will continue to follow-up with Dr. [name] for her cardiology care at this time and I will plan to see her back in 1 year with a gated cardiac CTA to follow-up on her sinus of Valsalva aneurysm.   I spent 60 minutes performing this consultation and > 50% of this time was spent face to face counseling and coordinating the care of this patient's sinus of Valsalva aneurysm.  Gaye Pollack, MD [facility] [phone]

## 2018-06-13 ENCOUNTER — Inpatient Hospital Stay: Payer: BLUE CROSS/BLUE SHIELD

## 2018-06-13 ENCOUNTER — Telehealth: Payer: Self-pay | Admitting: *Deleted

## 2018-06-13 VITALS — BP 111/41 | HR 75 | Temp 99.1°F | Resp 16

## 2018-06-13 DIAGNOSIS — D6942 Congenital and hereditary thrombocytopenia purpura: Secondary | ICD-10-CM | POA: Diagnosis not present

## 2018-06-13 DIAGNOSIS — D509 Iron deficiency anemia, unspecified: Secondary | ICD-10-CM

## 2018-06-13 MED ORDER — SODIUM CHLORIDE 0.9 % IV SOLN
Freq: Once | INTRAVENOUS | Status: AC
  Administered 2018-06-13: 09:00:00 via INTRAVENOUS
  Filled 2018-06-13: qty 250

## 2018-06-13 MED ORDER — SODIUM CHLORIDE 0.9 % IV SOLN
510.0000 mg | Freq: Once | INTRAVENOUS | Status: AC
  Administered 2018-06-13: 510 mg via INTRAVENOUS
  Filled 2018-06-13: qty 17

## 2018-06-13 NOTE — Patient Instructions (Signed)

## 2018-06-13 NOTE — Telephone Encounter (Signed)
Called [facility] regarding referral.  They have contacted patient but have not returned call.  Spoke with patients Mother and gave phone number to [facility].  She will call directly.  Routing to provider and will close encounter.

## 2018-06-13 NOTE — Telephone Encounter (Signed)
Patients' mom Monique (on dpr) calling to check on referral to a pulmonologist.

## 2018-06-17 ENCOUNTER — Encounter: Payer: Self-pay | Admitting: Pulmonary Disease

## 2018-06-17 ENCOUNTER — Ambulatory Visit (INDEPENDENT_AMBULATORY_CARE_PROVIDER_SITE_OTHER): Payer: BLUE CROSS/BLUE SHIELD | Admitting: Pulmonary Disease

## 2018-06-17 ENCOUNTER — Other Ambulatory Visit: Payer: Self-pay

## 2018-06-17 DIAGNOSIS — I2723 Pulmonary hypertension due to lung diseases and hypoxia: Secondary | ICD-10-CM | POA: Diagnosis not present

## 2018-06-17 DIAGNOSIS — J449 Chronic obstructive pulmonary disease, unspecified: Secondary | ICD-10-CM | POA: Diagnosis not present

## 2018-06-17 MED ORDER — UMECLIDINIUM-VILANTEROL 62.5-25 MCG/INH IN AEPB: 1.0000 | INHALATION_SPRAY | Freq: Every day | RESPIRATORY_TRACT | 0 refills | Status: DC

## 2018-06-17 NOTE — Assessment & Plan Note (Addendum)
She does seem to have severe emphysema by imaging and by PFTs We will schedule PFTs and compared to see if there is any worsening over the past 3 years. Meanwhile, we will step up from Incruse to Anoro-was provided a sample today and she will call back for prescription if this works better.  She does not seem to desaturate on exertion so hopefully lung function does not worsen  Obviously other causes of dyspnea include anemia and pulmonary hypertension

## 2018-06-17 NOTE — Assessment & Plan Note (Signed)
Appears secondary, may be related to emphysema but could also be related to congenital heart disease and now due to LV dysfunction.  She does not have symptoms of overt heart failure such as pedal edema or orthopnea She will need a right heart cath to assess whether she would benefit from vasodilator therapy.  Will discuss with cardiology and schedule and referred to the appropriate cardiologist

## 2018-06-17 NOTE — Progress Notes (Signed)
Subjective:    Patient ID: Tammie Wade, female    DOB: [birth year], 23 y.o.   MRN: [id number]  HPI  Chief Complaint  Patient presents with  . Pulm Consult    Per Mom, she has a history for COPD. Low iron levels. Advised to see a pulmonologist by cardiologist.    23 yo never smoker with history of bronchopulmonary dysplasia & severe airways obstruction who presents to reestablish care and also for assessment of new diagnosis of pulmonary hypertension.  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  Based on her prior evaluation at our office with Dr. Ashok Cordia, she has severe airway obstruction and decreased DLCO and CT imaging showed severe bullous emphysema.  She was maintained on a regimen of Spiriva which in the past year has been changed to Incruse.  She denies intercurrent infections but does have occasional wheezing for which she uses Proventil MDI.  She had to give up her job at [employer] due to dyspnea this would be classed as NYHA class II-III with occasional dyspnea on routine activities.  Over the past few months she has been diagnosed with severe anemia which was attributed to menorrhagia.  She required blood and iron transfusion and hemoglobin has improved from 6.2-8.3 on 3/2.  She follows up with oncology.  She went to [facility] long ED on 04/28/2018 and underwent CT angiogram which was negative for pulmonary bothersome and showed stable emphysema and ILD with enlarged pulmonary arteries at 4.9 cm suggestive of pulmonary hypretension.  She was referred to cardiology for an abnormal EKG showing T wave inversions in anterior leads. Echo showed mild reduction in LVEF to 45 to 50% with moderate MR and RVSP of 59 mm with dilated RV.  No shunting was noted. 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  She has been evaluated by T CTS and it was felt that she does not need surgery at this time.  She was referred back to [place] due to the finding of severe pulmonary hypertension.   She has lived in [place] in [place] and in [place] before moving to [place] in 2016.  Today mom does most of the talking and provides the history patient herself speaks in monosyllables or short sentences.  I have reviewed all CT imaging studies which show dysplasia of both lower lobes and right middle lobe with resulting hyperexpansion and air trapping in both upper lobes and lingula      Significant tests/ events reviewed  PFT 07/23/15: FVC 2.67 L (79%) FEV1 1.38 L (47%)ratio 50 no bronchodilator response TLC 5.42 L (105%)  DLCO uncorrected 38% (hemoglobin 13.4) 04/20/14: FVC 2.50 L (77%) FEV1 1.37 L (47%) ratio.55  no bronchodilator response TLC 5.03 L (98%)  DLCO uncorrected 56%   CT CHEST W/O 07/23/15 : Bullae noted throughout both lungs. Abnormal dysplastic formation/changes in both lungs. Volume loss in both lower lobes  07/12/15 Alpha-1 antitrypsin: MM (247) Alpha-1 Antitrypsin (05/01/14): PiM (>34 microM)  Past Medical History:  Diagnosis Date  . Abnormal uterine bleeding   . ASD (atrial septal defect)   . Blood transfusion without reported diagnosis    13 yrs at [facility] in [place] -4 units  . Bronchopulmonary dysplasia   . Chronic lung disease   . 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  COPD (chronic obstructive pulmonary disease) ([place])   . Emphysema lung ([place])   . Emphysema of lung ([place])   . 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 History of seizures   . Learning disabilities   . Meconium aspiration   . Prediabetes   . S/P repair of PDA   . Thrombocytopenia ([place])   . VSD (ventricular septal defect)    Past Surgical History:  Procedure Laterality Date  . CARDIAC SURGERY     open heart at 69 weeks old - bottom 2 chambers of heart were closed  .  DILATATION & CURETTAGE/HYSTEROSCOPY WITH MYOSURE N/A 01/24/2018   Procedure: DILATATION & CURETTAGE/HYSTEROSCOPY , PAP SMEAR;  Surgeon: Megan Salon, MD;  Location: [facility];  Service: Gynecology;  Laterality: N/A;  Possible Myosure  . EYE SURGERY Bilateral    x 2 weak muscles  . PATENT DUCTUS ARTERIOUS REPAIR     newborn  . VSD REPAIR     newborn    No Known Allergies  Social History   Socioeconomic History  . Marital status: Single    Spouse name: Not on file  . Number of children: Not on file  . Years of education: Not on file  . Highest education level: Not on file  Occupational History  . Not on file  Social Needs  . Financial resource strain: Not on file  . Food insecurity:    Worry: Not on file    Inability: Not on file  . Transportation needs:    Medical: Not on file    Non-medical: Not on file  Tobacco Use  . Smoking status: Never Smoker  . Smokeless tobacco: Never Used  Substance and Sexual Activity  . Alcohol use: No    Alcohol/week: 0.0 standard drinks  . Drug use: No  . Sexual activity: Never    Birth control/protection: Pill, Abstinence  Lifestyle  . Physical activity:    Days per week: Not on file    Minutes per session: Not on file  . Stress: Not on file  Relationships  . Social connections:    Talks on phone: Not on file    Gets together: Not on file    Attends religious service: Not on file    Active member of club or organization: Not on file    Attends meetings of clubs or organizations: Not on file    Relationship status: Not on file  . Intimate partner violence:    Fear of current or ex partner: Not on file    Emotionally abused: Not on file    Physically abused: Not on file    Forced sexual activity: Not on file  Other Topics Concern  . Not on file  Social History Narrative   Originally from [place]. Moved to [place] from [facility] in 2016. Has also lived in [place]. No pets currently. No bird or hot tub exposure. Remotely had problems with mold in an  apartment from [place] until 2007 in a bathroom. Does have carpet. Enjoys playing on her phone.      Family History  Problem Relation Age of Onset  . Osteopenia Mother   . Hypertension Mother   . Diabetes Mother   . Breast cancer Maternal Aunt   . Cancer Maternal Aunt   . Hyperlipidemia Maternal Aunt   . Diabetes Maternal Aunt   . Lung cancer Maternal Grandmother   . Hyperlipidemia Maternal Grandmother   . Diabetes Maternal Grandmother   .  COPD Maternal Grandmother   . Emphysema Maternal Grandmother   . Sickle cell anemia Maternal Grandmother   . Hyperlipidemia Maternal Grandfather   . Diabetes Maternal Grandfather   . Hypertension Father   . Hyperlipidemia Father   . Sickle cell anemia Maternal Uncle   . Rheum arthritis Cousin      Review of Systems  Constitutional: Negative for fever and unexpected weight change.  HENT: Negative for congestion, dental problem, ear pain, nosebleeds, postnasal drip, rhinorrhea, sinus pressure, sneezing, sore throat and trouble swallowing.   Eyes: Negative for redness and itching.  Respiratory: Negative for cough, chest tightness, shortness of breath and wheezing.   Cardiovascular: Negative for palpitations and leg swelling.  Gastrointestinal: Negative for nausea and vomiting.  Genitourinary: Negative for dysuria.  Musculoskeletal: Negative for joint swelling.  Skin: Negative for rash.  Allergic/Immunologic: Negative.  Negative for environmental allergies, food allergies and immunocompromised state.  Neurological: Negative for headaches.  Hematological: Does not bruise/bleed easily.  Psychiatric/Behavioral: Negative for dysphoric mood. The patient is not nervous/anxious.        Objective:   Physical Exam   Gen. Pleasant,  in no distress, normal affect ENT - mild pallor,icterus, no post nasal drip, class 2 airway Neck: No JVD, no thyromegaly, no carotid bruits Lungs: no use of accessory muscles, no dullness to percussion, decreased  without rales or rhonchi  Cardiovascular: Rhythm regular, heart sounds  normal, no murmurs or gallops, no peripheral edema Abdomen: soft and non-tender, no hepatosplenomegaly, BS normal. Musculoskeletal: No deformities, no cyanosis or clubbing Neuro:  alert, non focal, no tremors, halting speech, speaks in monosyllables, mom does most of the talking        Assessment & Plan:

## 2018-06-17 NOTE — Patient Instructions (Signed)
Change from Incruse to Anoro -1 puff daily Is back for prescription if this works. Schedule PFTs  I will discuss with your heart doctor about possible heart cath to measure the exact pressure in the lungs

## 2018-06-20 ENCOUNTER — Encounter: Payer: Self-pay | Admitting: Physician Assistant

## 2018-06-20 NOTE — Progress Notes (Signed)
Chart has been sent to Dr. Jeralyn Bennett at [facility]

## 2018-06-21 ENCOUNTER — Telehealth: Payer: Self-pay | Admitting: Internal Medicine

## 2018-06-21 NOTE — Telephone Encounter (Signed)
Called patient re appt on 3/18 with Gerrianne Scale She was seen by Lendell Caprice in 5/19  Recomm rescheduling given her lung problems and corona virus    Will forward for cancelling  With reschedule to Lendell Caprice later this spring  Per the pt, Dr Elsworth Soho would like to have the pt be considered fro a R heart cath  Pt understands curr plan

## 2018-06-22 ENCOUNTER — Ambulatory Visit: Payer: Medicaid Other | Admitting: Physician Assistant

## 2018-06-30 ENCOUNTER — Telehealth: Payer: Self-pay | Admitting: Pulmonary Disease

## 2018-06-30 MED ORDER — UMECLIDINIUM-VILANTEROL 62.5-25 MCG/INH IN AEPB: 1.0000 | INHALATION_SPRAY | Freq: Every day | RESPIRATORY_TRACT | 3 refills | Status: DC

## 2018-06-30 NOTE — Telephone Encounter (Signed)
Patient was last seen by RA on 06/17/18 and was advised to follow up on 07/22/18. I called patient to get her rescheduled and asked how she was doing. She stated that she was feeling about the same and that the Anoro has helped her. She requested to have a RX sent to [facility].   Advised patient that I would need to push her appt to June/July but if she needed care before then, she could call us. She verbalized understanding. Recall has been placed.   Nothing further needed at time of call.

## 2018-07-04 ENCOUNTER — Ambulatory Visit: Payer: Medicaid Other | Admitting: Family Medicine

## 2018-07-11 NOTE — Telephone Encounter (Signed)
Virtual Visit Pre-Appointment Phone Call   TELEPHONE CALL NOTE  [facility] has been deemed a candidate for a follow-up tele-health visit to limit community exposure during the Covid-19 pandemic. I spoke with the patient via phone to ensure availability of phone/video source, confirm preferred email & phone number, and discuss instructions and expectations.  I reminded Jisele Antionette Kun to be prepared with any vital sign and/or heart rhythm information that could potentially be obtained via home monitoring, at the time of her visit. I reminded [place] Antionette Froemming to expect a phone call at the time of her visit if her visit.  Did the patient verbally acknowledge consent to treatment? YES  Spoke with patient's mother. Requested VIDEO Visit with Dr. [name] on 4/7  Cleon Gustin, [place] 07/11/2018 11:19 AM   DOWNLOADING THE [place]  - If Apple, go to [employer] and type in WebEx in the search bar. [place] [employer], the blue/green circle. The app is free but as with any other app downloads, their phone may require them to verify saved payment information or Apple password. The patient does NOT have to create an account.  - If Android, ask patient to go to [employer] and type in WebEx in the search bar. [place] [employer], the blue/green circle. The app is free but as with any other app downloads, their phone may require them to verify saved payment information or Android password. The patient does NOT have to create an account.   CONSENT FOR TELE-HEALTH VISIT - PLEASE REVIEW  I hereby voluntarily request, consent and authorize [facility] and its employed or contracted physicians, physician assistants, nurse practitioners or other licensed health care professionals (the Practitioner), to provide me with telemedicine health care services (the "Services") as deemed necessary by the treating Practitioner. I acknowledge and  consent to receive the Services by the Practitioner via telemedicine. I understand that the telemedicine visit will involve communicating with the Practitioner through live audiovisual communication technology and the disclosure of certain medical information by electronic transmission. I acknowledge that I have been given the opportunity to request an in-person assessment or other available alternative prior to the telemedicine visit and am voluntarily participating in the telemedicine visit.  I understand that I have the right to withhold or withdraw my consent to the use of telemedicine in the course of my care at any time, without affecting my right to future care or treatment, and that the Practitioner or I may terminate the telemedicine visit at any time. I understand that I have the right to inspect all information obtained and/or recorded in the course of the telemedicine visit and may receive copies of available information for a reasonable fee.  I understand that some of the potential risks of receiving the Services via telemedicine include:  Marland Kitchen Delay or interruption in medical evaluation due to technological equipment failure or disruption; . Information transmitted may not be sufficient (e.g. poor resolution of images) to allow for appropriate medical decision making by the Practitioner; and/or  . In rare instances, security protocols could fail, causing a breach of personal health information.  Furthermore, I acknowledge that it is my responsibility to provide information about my medical history, conditions and care that is complete and accurate to the best of my ability. I acknowledge that Practitioner's advice, recommendations, and/or decision may be based on factors not within their control, such as incomplete or inaccurate data provided by me or distortions  of diagnostic images or specimens that may result from electronic transmissions. I understand that the practice of medicine is not an  exact science and that Practitioner makes no warranties or guarantees regarding treatment outcomes. I acknowledge that I will receive a copy of this consent concurrently upon execution via email to the email address I last provided but may also request a printed copy by calling the office of [place] [place].    I understand that my insurance will be billed for this visit.   I have read or had this consent read to me. . I understand the contents of this consent, which adequately explains the benefits and risks of the Services being provided via telemedicine.  . I have been provided ample opportunity to ask questions regarding this consent and the Services and have had my questions answered to my satisfaction. . I give my informed consent for the services to be provided through the use of telemedicine in my medical care  By participating in this telemedicine visit I agree to the above.

## 2018-07-12 ENCOUNTER — Telehealth (INDEPENDENT_AMBULATORY_CARE_PROVIDER_SITE_OTHER): Payer: BLUE CROSS/BLUE SHIELD | Admitting: Interventional Cardiology

## 2018-07-12 ENCOUNTER — Other Ambulatory Visit: Payer: Self-pay

## 2018-07-12 ENCOUNTER — Encounter: Payer: Self-pay | Admitting: Interventional Cardiology

## 2018-07-12 VITALS — BP 104/60 | HR 79 | Ht 66.0 in | Wt 125.4 lb

## 2018-07-12 DIAGNOSIS — Q210 Ventricular septal defect: Secondary | ICD-10-CM

## 2018-07-12 DIAGNOSIS — J449 Chronic obstructive pulmonary disease, unspecified: Secondary | ICD-10-CM

## 2018-07-12 DIAGNOSIS — I2723 Pulmonary hypertension due to lung diseases and hypoxia: Secondary | ICD-10-CM

## 2018-07-12 DIAGNOSIS — Q250 Patent ductus arteriosus: Secondary | ICD-10-CM

## 2018-07-12 DIAGNOSIS — D509 Iron deficiency anemia, unspecified: Secondary | ICD-10-CM

## 2018-07-12 DIAGNOSIS — Z8774 Personal history of (corrected) congenital malformations of heart and circulatory system: Secondary | ICD-10-CM

## 2018-07-12 NOTE — Progress Notes (Signed)
Virtual Visit via Video Note   This visit type was conducted due to national recommendations for restrictions regarding the COVID-19 Pandemic (e.g. social distancing) in an effort to limit this patient's exposure and mitigate transmission in our community.  Due to her co-morbid illnesses, this patient is at least at moderate risk for complications without adequate follow up.  This format is felt to be most appropriate for this patient at this time.  All issues noted in this document were discussed and addressed.  A limited physical exam was performed with this format.  Please refer to the patient's chart for her consent to telehealth for [facility].   Evaluation Performed:  Follow-up visit  Date:  07/12/2018   ID:  Tammie Wade, DOB 1995/07/03, MRN [id number]  Patient Location: Home  Provider Location: Home  PCP:  [name], NP-C  Cardiologist:  No primary care provider on file. [place] Electrophysiologist:  None   Chief Complaint: Congenital heart disease, VSD/PDA repair  History of Present Illness:    Tammie Wade is a 23 y.o. female who presents via Engineer, civil (consulting) for a telehealth visit today.     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  I saw her in May 2019 for evaluation of an abnormal ECG noted by her primary care provider.  This showed anterior T wave inversions.  This is felt to be due to to her previous cardiac surgery.  She underwent an echocardiogram at that time which showed a mild reduction in left ventricular ejection fraction  to 45 to 50% with grade 1 diastolic dysfunction.  There is mild aortic insufficiency.  There is moderate mitral regurgitation.  RV systolic function was mildly to moderately reduced with mild cavity dilation and pulmonary artery systolic pressure was measured at 59 mmHg.  There were no shunts at the atrial or ventricular level.    She presented to the emergency department in January 2020 with gradual onset of cough and worsening shortness of breath of several weeks duration.  She had a CT of the chest which was negative for pulmonary embolism.  It did show a chronically enlarged main pulmonary artery that was 4.9 cm.  The aortic root was felt to be abnormal but incompletely assessed.  There is moderate left ventricular dilation with left ventricular thinning and large right ventricle.  She underwent a repeat echocardiogram on 05/25/2018 which showed a left-ventricular ejection fraction of 40 to 45%.  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  There is mild eccentric aortic insufficiency.  There is moderate mitral regurgitation with a thickened mitral valve suggesting previous repair. I asked Dr. Cyndia Bent to review her echocardiogram and she then had a gated cardiac CTA to better visualize the aortic root and sinuses of Valsalva.  This showed aneurysmal dilation of the noncoronary sinus of Valsalva with a measurement of 40 x 31 mm.  This was unchanged compared to a prior CT scan of the chest on 07/23/2015.  The aortic root diameter across the noncoronary sinus was 48 mm.  The  sinotubular junction was 38 x 35 mm and the ascending aorta was 40 mm.  The descending thoracic aorta was 24 mm.  In the past,  According to her mother they previously lived in [place] where she had her cardiac surgery as an infant.  They have moved around a fair amount living in [place] as well as in [place] before moving to [place] in  May 12, 2014.  She was previously followed for her congenital heart disease but has not been seen in many years.  Her mother says that her husband has been quite ill over the past several years and died in 2017/05/12 and most of her concentration has been on his health over the past couple years.  The patient and her mother report progressive exertional dyspnea and fatigue as well as intermittent orthopnea.  She has had no lower extremity edema.  She denies dizziness and syncope.  She has had occasional chest pain.  Review of her medical record shows that her hemoglobin has been 11-12 in the past.  She was seen at the end of January and had a hemoglobin of 8.3 and then presented on 05/18/2018 with shortness of breath and a hemoglobin of 6.9.  She was transfused with a rise in her hemoglobin to 8.0 and was given intravenous iron and discharge with cardiology follow-up.  She has been followed by Dr. Sonny Dandy for her chronic thrombocytopenia and anemia.  Her OB/GYN is Dr. Hale Bogus.  She saw Dr. Elsworth Soho.  She was started on Anoro and has felt better.  She walks daily in her neighborhood.  Given the pulmonary hypertension noted on her echo, there is question of whether she needed right heart catheterization.  We have been trying to get her referred to the [facility] but of had some difficulty communicating the referral.  I have communicated with Dr. Corine Shelter directly.  Denies : Chest pain. Dizziness. Leg edema. Nitroglycerin use. Orthopnea. Palpitations. Paroxysmal nocturnal dyspnea. Shortness of breath. Syncope.    She has noticed the difference since starting her inhaler.  Her mother also reports that she is walking up stairs easier as well.  The patient does not have symptoms concerning for COVID-19 infection (fever, chills, cough, or new shortness of breath).    Past Medical History:  Diagnosis Date   Abnormal uterine bleeding    ASD (atrial septal defect)    Blood transfusion  without reported diagnosis    11 yrs at [facility] in [place] -4 units   Bronchopulmonary dysplasia    Chronic lung disease    Congenital thrombocytopenia (HCC)    COPD (chronic obstructive pulmonary disease) (HCC)    Emphysema lung (HCC)    Emphysema of lung (HCC)    Hearing loss    75% loss in right ear - no hearing aids   History of seizures    Learning disabilities    Meconium aspiration    Prediabetes    S/P repair of PDA    Thrombocytopenia (HCC)    VSD (ventricular septal defect)    Past Surgical History:  Procedure Laterality Date   CARDIAC SURGERY     open heart at 52 weeks old - bottom 2 chambers of heart were closed   DILATATION & CURETTAGE/HYSTEROSCOPY WITH MYOSURE N/A 01/24/2018   Procedure: DILATATION & CURETTAGE/HYSTEROSCOPY , PAP SMEAR;  Surgeon: Megan Salon, MD;  Location: [facility];  Service: Gynecology;  Laterality: N/A;  Possible Myosure   EYE SURGERY Bilateral  x 2 weak muscles   PATENT DUCTUS ARTERIOUS REPAIR     newborn   VSD REPAIR     newborn     Current Meds  Medication Sig   albuterol (PROVENTIL HFA;VENTOLIN HFA) 108 (90 Base) MCG/ACT inhaler Inhale 2 puffs into the lungs every 6 (six) hours as needed for wheezing or shortness of breath.   Multiple Vitamin (MULTIVITAMIN WITH MINERALS) TABS tablet Take 1 tablet by mouth daily.    norgestrel-ethinyl estradiol (LOW-OGESTREL) 0.3-30 MG-MCG tablet Take 1 tablet by mouth daily. Skipping placebo pills.  She is taking continuous active OCPs.   umeclidinium-vilanterol (ANORO ELLIPTA) 62.5-25 MCG/INH AEPB Inhale 1 puff into the lungs daily.     Allergies:   Patient has no known allergies.   Social History   Tobacco Use   Smoking status: Never Smoker   Smokeless tobacco: Never Used  Substance Use Topics   Alcohol use: No    Alcohol/week: 0.0 standard drinks   Drug use: No     Family Hx: The patient's family history includes Breast cancer in her maternal aunt; COPD in  her maternal grandmother; Cancer in her maternal aunt; Diabetes in her maternal aunt, maternal grandfather, maternal grandmother, and mother; Emphysema in her maternal grandmother; Hyperlipidemia in her father, maternal aunt, maternal grandfather, and maternal grandmother; Hypertension in her father and mother; Lung cancer in her maternal grandmother; Osteopenia in her mother; Rheum arthritis in her cousin; Sickle cell anemia in her maternal grandmother and maternal uncle.  ROS:   Please see the history of present illness.     All other systems reviewed and are negative.   Prior CV studies:   The following studies were reviewed today:  Prior echo result reviewed.,  CT result reviewed.  Labs/Other Tests and Data Reviewed:    EKG:  Normal sinus rhythm, PVCs, diffuse ST changes in January 2020  Recent Labs: 05/20/2018: ALT 21; BUN 8; Creatinine, Ser 0.60; Potassium 4.0; Sodium 135 06/06/2018: Hemoglobin 8.3; Platelet Count 118   Recent Lipid Panel Lab Results  Component Value Date/Time   CHOL 120 (L) 07/02/2015 12:01 AM   TRIG 91 07/02/2015 12:01 AM   HDL 49 07/02/2015 12:01 AM   CHOLHDL 2.4 07/02/2015 12:01 AM   LDLCALC 53 07/02/2015 12:01 AM    Wt Readings from Last 3 Encounters:  07/12/18 125 lb 6.4 oz (56.9 kg)  06/17/18 124 lb (56.2 kg)  06/08/18 121 lb (54.9 kg)     Objective:    Vital Signs:  BP 104/60    Pulse 79    Ht _0  (1.676 m)    Wt 125 lb 6.4 oz (56.9 kg)    BMI 20.24 kg/m    Well nourished, well developed female in no acute distress. No apparent shortness of breath.  ASSESSMENT & PLAN:    1. H/o VSD/PDA: SHOB improved with Anoro.  Status post corrective surgery.  No shunt noted on 2019 echocardiogram. 2. Wil check with PharmD if flax seed oil and sea moss are ok for her condition.  Breathing has improved with these supplements.  3. Pulm HTN: Noted on echo.  From a breathing standpoint, she is feeling better.  Will refer back to [facility].  She may need a repeat echocardiogram once the virus concerns have decreased. 4. Chronic iron deficiency anemia.  COVID-19 Education: The signs and symptoms of COVID-19 were discussed with the patient and how to seek care for testing (follow up with PCP or arrange E-visit).  The importance of social distancing was discussed today.  Time:   Today, I have spent 20 minutes with the patient with telehealth technology discussing the above problems.     Medication Adjustments/Labs and Tests Ordered: Current medicines are reviewed at length with the patient today.  Concerns regarding medicines are outlined above.  Tests Ordered: No orders of the defined types were placed in this encounter.  Medication Changes: No orders of the defined types were placed in this encounter.   Disposition:  Follow up prn, based on evaluation by [facility].  Signed, Larae Grooms, MD  07/12/2018 8:41 AM    [facility]

## 2018-07-12 NOTE — Progress Notes (Signed)
It is my understanding that she is no longer my patient and that her mother was planning on having her see a new PCP. Can we check on this so that I no longer get the referral messages? Thanks.

## 2018-07-12 NOTE — Progress Notes (Signed)
See previous message. Forgot to route

## 2018-07-12 NOTE — Progress Notes (Signed)
Pt is going to be seeing [facility] as her New pcp but right now they are not seeing new pts so that's why this is being referred to you. I will take you off as PCP

## 2018-07-12 NOTE — Patient Instructions (Signed)
Medication Instructions:  Your physician recommends that you continue on your current medications as directed. Please refer to the Current Medication list given to you today.  If you need a refill on your cardiac medications before your next appointment, please call your pharmacy.   Lab work: None Ordered  If you have labs (blood work) drawn today and your tests are completely normal, you will receive your results only by: Marland Kitchen MyChart Message (if you have MyChart) OR . A paper copy in the mail If you have any lab test that is abnormal or we need to change your treatment, we will call you to review the results.  Testing/Procedures: None ordered  Follow-Up: You have been referred to Dr. Jeralyn Bennett  Follow up AS NEEDED with Dr. [name]

## 2018-07-14 ENCOUNTER — Telehealth: Payer: Self-pay | Admitting: Pulmonary Disease

## 2018-07-14 ENCOUNTER — Telehealth: Payer: Self-pay

## 2018-07-14 MED ORDER — UMECLIDINIUM-VILANTEROL 62.5-25 MCG/INH IN AEPB: 1.0000 | INHALATION_SPRAY | Freq: Every day | RESPIRATORY_TRACT | 3 refills | Status: DC

## 2018-07-14 NOTE — Telephone Encounter (Signed)
Called and spoke with patient , verified pharmacy, and sent in refill until next office visit in July with Dr. Elsworth Soho   Nothing further needed at this time.

## 2018-07-14 NOTE — Telephone Encounter (Signed)
Called and made patient's mother (DPR on file) aware that it is okay for the patient  to take flax seed and sea moss supplements, but she will need her PCP to keep an eye on her thyroid fxn if she continues with the sea moss supplement as it contains a decent amount of iodine. She verbalized understanding and thanked me for the call.

## 2018-07-14 NOTE — Progress Notes (Signed)
Please let them know about need for thyroid monitoring, per [facility].

## 2018-07-14 NOTE — Telephone Encounter (Signed)
Tammie Booze, MD  You 4 hours ago (8:08 AM)      Please let them know about need for thyroid monitoring, per [facility].      Documentation     Supple, Harlon Flor, RPH  Tammie Booze, MD 2 days ago    [place] to take flax seed and sea moss supplements - she will need her PCP to keep an eye on her thyroid fxn if she continues with the sea moss supplement as it contains a decent amount of iodine

## 2018-07-14 NOTE — Telephone Encounter (Signed)
Left message for patient to call back

## 2018-07-14 NOTE — Telephone Encounter (Signed)
New Message   Patient returning Brittany's phone call would like to be called back.

## 2018-07-22 ENCOUNTER — Ambulatory Visit: Payer: Medicaid Other | Admitting: Pulmonary Disease

## 2018-08-08 NOTE — Assessment & Plan Note (Deleted)
Hospitalization 05/19/2018-05/20/2018 for menorrhagia with a hemoglobin of 6.3, received blood transfusions and 1 dose of IV iron infusion Lab review:  05/19/2018: Ferritin 4, hemoglobin 6.9, MCV 72, platelets 90 05/30/2018: Hemoglobin 8.1, MCV 78, platelets 129   IV iron: March 2020 GYN follow-up regarding the menorrhagia.  Recheck CBC and follow-up iron studiesin 3 months for follow-up

## 2018-08-10 ENCOUNTER — Telehealth: Payer: Self-pay | Admitting: Hematology and Oncology

## 2018-08-10 ENCOUNTER — Telehealth: Payer: Self-pay | Admitting: *Deleted

## 2018-08-10 NOTE — Telephone Encounter (Signed)
Received call from pts mother wanting to cancel apt for tomorrow 08/11/2018.  She states the pt has been asymptomatic and has no signs of bleeding and does not want to come in until the covid 19 is gone.  Message will be sent to scheduling to schedule pt for in June for labs and MD apt and pts mother educated to call us if the pt does start to experience any symptoms of anemia.  Verbalized understanding.

## 2018-08-10 NOTE — Telephone Encounter (Signed)
Scheduled appts per sch msg. Mailed printout

## 2018-08-11 ENCOUNTER — Inpatient Hospital Stay: Payer: BLUE CROSS/BLUE SHIELD

## 2018-08-11 ENCOUNTER — Inpatient Hospital Stay: Payer: BLUE CROSS/BLUE SHIELD | Admitting: Hematology and Oncology

## 2018-08-18 ENCOUNTER — Other Ambulatory Visit: Payer: Self-pay

## 2018-08-18 ENCOUNTER — Encounter: Payer: Self-pay | Admitting: Family Medicine

## 2018-08-18 ENCOUNTER — Ambulatory Visit: Payer: BLUE CROSS/BLUE SHIELD | Attending: Family Medicine | Admitting: Family Medicine

## 2018-08-18 DIAGNOSIS — I2723 Pulmonary hypertension due to lung diseases and hypoxia: Secondary | ICD-10-CM | POA: Diagnosis not present

## 2018-08-18 DIAGNOSIS — J449 Chronic obstructive pulmonary disease, unspecified: Secondary | ICD-10-CM

## 2018-08-18 DIAGNOSIS — N920 Excessive and frequent menstruation with regular cycle: Secondary | ICD-10-CM | POA: Diagnosis not present

## 2018-08-18 DIAGNOSIS — D500 Iron deficiency anemia secondary to blood loss (chronic): Secondary | ICD-10-CM | POA: Diagnosis not present

## 2018-08-18 DIAGNOSIS — J439 Emphysema, unspecified: Secondary | ICD-10-CM

## 2018-08-18 DIAGNOSIS — D6942 Congenital and hereditary thrombocytopenia purpura: Secondary | ICD-10-CM

## 2018-08-18 NOTE — Progress Notes (Signed)
Virtual Visit via Telephone Note  I connected with Tammie Wade and her mother  on 08/18/18 at  8:50 AM EDT by telephone and verified that I am speaking with the correct person using two identifiers.   I discussed the limitations, risks, security and privacy concerns of performing an evaluation and management service by telephone and the availability of in person appointments. I also discussed with the patient that there may be a patient responsible charge related to this service. The patient expressed understanding and agreed to proceed.  Patient Location: Home Provider Location: Office Others participating in call: call initiated by Emilio Aspen, RMA and mother of patient    History of Present Illness:      23 year old female new to the practice.  Patient was the initial person whom I spoke with but patient requested that her mother answered the questions and had her mother come pick up the phone.  Per patient's mother, Tammie Wade, patient with a somewhat complicated medical history.  Mother reports that patient had issues during the birthing process with meconium aspiration and was also found to have a congenital heart defect and had to have open heart surgery when she was 26 weeks old for correction of a ventral septal defect and patent ductus arteriosus.  Patient also had issues with bronchopulmonary dysplasia at birth.  Patient later had onset of her menses at age 11 and was diagnosed with a blood disorder causing patient to have a very low platelet count.      Mom reports that a few months ago, patient was having issues with weight loss, loss of appetite but mom attributed this to the fact that patient's father passed away in 04/16/2022 from renal failure.  Mother thought the patient might be depressed but she knew that the patient was not her normal self.  Patient had follow-up with her cardiologist who also thought that the patient did not look like her normal self and patient had blood work  and was found to have anemia which required transfusion on 05/19/2018.  She is also been having issues with increased shortness of breath which is thought to be related to her bronchopulmonary dysplasia at birth.  She does see a pulmonologist as well as a cardiologist.  Patient's cardiologist however has recommended that patient see a specialist connected with an academic Medical Center as patient now appears to be developing issues with enlargement of her aorta and increased issues with shortness of breath and fatigue.  Mother states that she is not sure of the referral has already been made because she has not been able to have patient follow-up with her cardiologist or pulmonologist due to the current COVID 19 pandemic which is caused visits to be delayed or rescheduled.  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  Patient is not currently working and is staying at home to limit her exposure to others due to the COVID-19 pandemic.  Mother states that patient does have some developmental delays/educational deficiency due to her multiple health problems.  Mother reports that patient continues to have some shortness of breath with exertion, occasional nonproductive cough.  Some fatigue but this is improved status post transfusion.  Patient has had no recent complaints of abdominal pain, no nausea/vomiting/diarrhea or constipation.  No fever or chills.  No loss of appetite.  No  complaints of ear pain or nasal congestion.  No issues with rashes/wounds to the skin no complaints of chest pain or palpitations.  Shortness of breath did improve with recent addition of Anoro per pulmonology.      Patient's current anemia is thought to be secondary to menorrhagia.  Patient does have a gynecologist, Hale Bogus and patient has had prior D&C and is currently  on oral contraceptives to help decrease heavy menses.   Past Medical History:  Diagnosis Date   Abnormal uterine bleeding    ASD (atrial septal defect)    Blood transfusion without reported diagnosis    11 yrs at [facility] in [place] -4 units   Bronchopulmonary dysplasia    Chronic lung disease    Congenital thrombocytopenia (HCC)    COPD (chronic obstructive pulmonary disease) (HCC)    Emphysema lung (HCC)    Emphysema of lung (HCC)    Hearing loss    75% loss in right ear - no hearing aids   History of seizures    Learning disabilities    Meconium aspiration    Prediabetes    S/P repair of PDA    Thrombocytopenia (HCC)    VSD (ventricular septal defect)     Past Surgical History:  Procedure Laterality Date   CARDIAC SURGERY     open heart at 64 weeks old - bottom 2 chambers of heart were closed   DILATATION & CURETTAGE/HYSTEROSCOPY WITH MYOSURE N/A 01/24/2018   Procedure: DILATATION & CURETTAGE/HYSTEROSCOPY , PAP SMEAR;  Surgeon: Megan Salon, MD;  Location: [facility];  Service: Gynecology;  Laterality: N/A;  Possible Myosure   EYE SURGERY Bilateral    x 2 weak muscles   PATENT DUCTUS ARTERIOUS REPAIR     newborn   VSD REPAIR     newborn    Family History  Problem Relation Age of Onset   Osteopenia Mother    Hypertension Mother    Diabetes Mother    Breast cancer Maternal Aunt    Cancer Maternal Aunt    Hyperlipidemia Maternal Aunt    Diabetes Maternal Aunt    Lung cancer Maternal Grandmother    Hyperlipidemia Maternal Grandmother    Diabetes Maternal Grandmother    COPD Maternal Grandmother    Emphysema Maternal Grandmother    Sickle cell anemia Maternal Grandmother    Hyperlipidemia Maternal Grandfather    Diabetes Maternal Grandfather    Hypertension Father    Hyperlipidemia Father    Sickle cell anemia Maternal Uncle    Rheum arthritis Cousin     Social History   Tobacco Use   Smoking status: Never Smoker    Smokeless tobacco: Never Used  Substance Use Topics   Alcohol use: No    Alcohol/week: 0.0 standard drinks   Drug use: No     No Known Allergies     Observations/Objective: No vital signs or physical exam conducted as visit was done via telephone  Assessment and Plan: 1. Iron deficiency anemia due to chronic blood loss Patient's notes from transfusion reviewed as well as recent specialty appointment visits.  Patient is status post D&C in October 2019 and is currently on OCPs to help with bleeding.  Patient's last hemoglobin on review of labs was 8.3 on 06/06/2018.  When it is safe to do so, perhaps in another 3 to 4 weeks or more, patient should come into the lab for blood work if she has not already had follow-up CBC done by another specialist by that  time.  2. Congenital thrombocytopenia ([place]) Patient with congenital thrombocytopenia causing excessive bleeding/menorrhagia.  Patient is being followed by OB/GYN.  Patient will have CBC here in the office and 4 to 6 weeks depending on the number of community COVID cases.  3. Pulmonary hypertension due to COPD [facility]) We will contact patient's cardiologist regarding the referral to a specialist as mother states that she has not received any information.  Patient with history of PDA requiring surgery shortly after birth as well as history of atrial septal defect.  Patient has had evidence of emphysema on imaging and his follow-up with pulmonology but is waiting for further follow-up with an academic specialist due to her complicated cardiopulmonary history  4. Bronchopulmonary dysplasia; 5.  Pulmonary hypertension Patient with bronchopulmonary dysplasia at birth and now with imaging consistent with pulmonary emphysema/COPD and patient with possible asthma/reactive airways.  She is being followed currently by pulmonology and per mother, patient has some improvement in her breathing with a neuro that was recently prescribed and has as needed use  of albuterol.  Breathing also seems to be better since patient is no longer working as she did have some exposure to chemicals that could cause airway irritation with her recent job.    Follow Up Instructions: Due to the current COVID-19 pandemic and patient's multiple health issues, patient should not be seen in the office at this time and should continue to limit social interactions.  If the number of COVID-19 cases have declined over the next 4 to 6 weeks, she may come into the office for exam/office visit as well as labs.  Mother should otherwise call or contact the office for patient's specialist if she has any problems or concerns.    I discussed the assessment and treatment plan with the patient. The patient was provided an opportunity to ask questions and all were answered. The patient agreed with the plan and demonstrated an understanding of the instructions.   The patient was advised to call back or seek an in-person evaluation if the symptoms worsen or if the condition fails to improve as anticipated.  I provided 23 minutes of non-face-to-face time during this encounter.   Antony Blackbird, MD

## 2018-08-18 NOTE — Progress Notes (Signed)
New Patient est care. Per patient she is feeling better. Per patient she was having issues with bleeding and have a medical back grown. Per pt last month or two, she have been noticing losing a lot of weight(Nov-Dec 2019). Went to [facility] for chest pains and can't breath that good and her Hemoglobin was always 6.2 but it has always been low but not that low. Per pt mother, patient received blood transfusion in February 2020. Per pt mother patient have a blood disorder and see a doctor at the Cancer center for this. October 2019 patient had DNT done. Per pt she was born with 3 lung diseases.   Per pt mother she wanted to switch her PCP and they never gived her a regular PCP. Per pt mother she wanted patient to be followed but an actual doctor. Per pt mother she is doing much better.

## 2018-09-16 ENCOUNTER — Telehealth: Payer: Self-pay | Admitting: Hematology and Oncology

## 2018-09-16 NOTE — Assessment & Plan Note (Signed)
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 She tells me that her platelet counts ranged from 60-90,000. Her problems with bleeding were severe during menarche but have subsequently improved after being treated with birth control pills. She does not have any current problems with bruising or bleeding.  Prior workup: An extensive workup by Dr. Melvenia Beam (in [place]) in the past showed no qualitative platelet defect (normal platelet aggregation testing) and no evidence of ITP. Bone marrow testing in the past has shown normal megakaryocytes, and there are no other findings on her CBC today suggest new marrow dysplasias.   Differential diagnosisfor congenital thrombocytopenia as is extremely wide which include amegakaryocytic thrombocytopenia especially because of the fact that she has hearing impairment. Other X-linked thrombocytopenias are also possible as well as MYH-9 -related diseases.  Lab review:

## 2018-09-16 NOTE — Assessment & Plan Note (Signed)
Hospitalization 05/19/2018-05/20/2018 for menorrhagia with a hemoglobin of 6.3, received blood transfusions and 1 dose of IV iron infusion Lab review: 05/19/2018: Ferritin 4, hemoglobin 6.9, MCV 72, platelets 90 05/30/2018: Hemoglobin 8.1, MCV 78, platelets 129  Prior treatment: IV iron therapy March 2020 GYN follow-up regarding the menorrhagia.   Recheck CBC and follow-up iron studies in 3 months and follow-up

## 2018-09-16 NOTE — Telephone Encounter (Signed)
I talk with patient regarding video visit °

## 2018-09-21 ENCOUNTER — Telehealth: Payer: Self-pay | Admitting: Hematology and Oncology

## 2018-09-21 NOTE — Progress Notes (Signed)
HEMATOLOGY-ONCOLOGY MYCHART VIDEO VISIT PROGRESS NOTE  I connected with [place] on 09/22/2018 at  9:15 AM EDT by MyChart video conference and verified that I am speaking with the correct person using two identifiers.  I discussed the limitations, risks, security and privacy concerns of performing an evaluation and management service by MyChart and the availability of in person appointments.  I also discussed with the patient that there may be a patient responsible charge related to this service. The patient expressed understanding and agreed to proceed.  Patient's Location: Home Physician Location: Clinic  CHIEF COMPLIANT: Severe iron deficiency anemia   INTERVAL HISTORY: Tammie Wade is a 23 y.o. female with above-mentioned history of congenital thrombocytopenia and severe anemia previously requiring blood transfusion and IV iron. She presents over MyChart today for 4 month follow-up to review recent labs.   REVIEW OF SYSTEMS:   Constitutional: Denies fevers, chills or abnormal weight loss Eyes: Denies blurriness of vision Ears, nose, mouth, throat, and face: Denies mucositis or sore throat Respiratory: Denies cough, dyspnea or wheezes Cardiovascular: Denies palpitation, chest discomfort Gastrointestinal:  Denies nausea, heartburn or change in bowel habits Skin: Denies abnormal skin rashes Lymphatics: Denies new lymphadenopathy or easy bruising Neurological:Denies numbness, tingling or new weaknesses Behavioral/Psych: Mood is stable, no new changes  Extremities: No lower extremity edema Breast: denies any pain or lumps or nodules in either breasts All other systems were reviewed with the patient and are negative.  Observations/Objective:  There were no vitals filed for this visit. There is no height or weight on file to calculate BMI.  I have reviewed the data as listed CMP Latest Ref Rng & Units 05/20/2018 05/19/2018 04/28/2018  Glucose 70 - 99 mg/dL 93 70 91   BUN 6 - 20 mg/dL _0 Creatinine 0.44 - 1.00 mg/dL 0.60 0.62 0.56  Sodium 135 - 145 mmol/L 135 137 137  Potassium 3.5 - 5.1 mmol/L 4.0 3.8 4.1  Chloride 98 - 111 mmol/L 106 108 108  CO2 22 - 32 mmol/L 19(L) 22 19(L)  Calcium 8.9 - 10.3 mg/dL 8.4(L) 9.0 8.9  Total Protein 6.5 - 8.1 g/dL 6.4(L) 7.7 -  Total Bilirubin 0.3 - 1.2 mg/dL 1.2 1.5(H) -  Alkaline Phos 38 - 126 U/L 20(L) 24(L) -  AST 15 - 41 U/L 21 27 -  ALT 0 - 44 U/L 21 27 -    Lab Results  Component Value Date   WBC 5.2 06/06/2018   HGB 8.3 (L) 06/06/2018   HCT 29.4 (L) 06/06/2018   MCV 77.0 (L) 06/06/2018   PLT 118 (L) 06/06/2018   NEUTROABS 3.7 06/06/2018      Assessment Plan:  Congenital thrombocytopenia ([place]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 She tells me that her platelet counts ranged from 60-90,000. Her problems with bleeding were severe during menarche but have subsequently improved after being treated with birth control pills. She does not have any current problems with bruising or bleeding.  Prior workup: An extensive workup by Dr. Melvenia Beam (in [place]) in the past showed no qualitative platelet defect (normal platelet aggregation testing) and no evidence of ITP. Bone marrow testing in the past has shown normal megakaryocytes, and there are no other findings on her CBC today suggest new marrow dysplasias.   Differential diagnosisfor congenital thrombocytopenia as is extremely wide which include amegakaryocytic thrombocytopenia especially because of the fact that she has hearing impairment. Other X-linked thrombocytopenias are also possible  as well as MYH-9 -related diseases.    Iron deficiency anemia Hospitalization 05/19/2018-05/20/2018 for menorrhagia with a hemoglobin of 6.3, received blood transfusions and 1 dose of IV iron infusion Lab review: 05/19/2018: Ferritin 4, hemoglobin 6.9, MCV 72, platelets 90 05/30/2018: Hemoglobin  8.1, MCV 78, platelets 129  Prior treatment: IV iron therapy March 2020 GYN follow-up regarding the menorrhagia.   We will need to check her blood work.  She will come today at 2:00 with a CBC. I will call her tomorrow with results of this test and then determine further follow-up.    I discussed the assessment and treatment plan with the patient. The patient was provided an opportunity to ask questions and all were answered. The patient agreed with the plan and demonstrated an understanding of the instructions. The patient was advised to call back or seek an in-person evaluation if the symptoms worsen or if the condition fails to improve as anticipated.   I provided 15 minutes of face-to-face MyChart video visit time during this encounter.    Rulon Eisenmenger, MD 09/22/2018   I, Molly Dorshimer, am acting as scribe for Nicholas Lose, MD.  I have reviewed the above documentation for accuracy and completeness, and I agree with the above.

## 2018-09-21 NOTE — Telephone Encounter (Signed)
Confirmed appt and verified info.

## 2018-09-22 ENCOUNTER — Other Ambulatory Visit: Payer: Self-pay

## 2018-09-22 ENCOUNTER — Inpatient Hospital Stay: Payer: Medicaid Other | Attending: Hematology and Oncology | Admitting: Hematology and Oncology

## 2018-09-22 ENCOUNTER — Inpatient Hospital Stay: Payer: Medicaid Other

## 2018-09-22 ENCOUNTER — Other Ambulatory Visit: Payer: BLUE CROSS/BLUE SHIELD

## 2018-09-22 DIAGNOSIS — D500 Iron deficiency anemia secondary to blood loss (chronic): Secondary | ICD-10-CM | POA: Diagnosis not present

## 2018-09-22 DIAGNOSIS — N920 Excessive and frequent menstruation with regular cycle: Secondary | ICD-10-CM | POA: Diagnosis not present

## 2018-09-22 DIAGNOSIS — D6942 Congenital and hereditary thrombocytopenia purpura: Secondary | ICD-10-CM | POA: Diagnosis present

## 2018-09-22 LAB — CBC WITH DIFFERENTIAL (CANCER CENTER ONLY)
Abs Immature Granulocytes: 0.01 10*3/uL (ref 0.00–0.07)
Basophils Absolute: 0 10*3/uL (ref 0.0–0.1)
Basophils Relative: 0 %
Eosinophils Absolute: 0.1 10*3/uL (ref 0.0–0.5)
Eosinophils Relative: 1 %
HCT: 41.8 % (ref 36.0–46.0)
Hemoglobin: 13.2 g/dL (ref 12.0–15.0)
Immature Granulocytes: 0 %
Lymphocytes Relative: 17 %
Lymphs Abs: 0.8 10*3/uL (ref 0.7–4.0)
MCH: 29.5 pg (ref 26.0–34.0)
MCHC: 31.6 g/dL (ref 30.0–36.0)
MCV: 93.3 fL (ref 80.0–100.0)
Monocytes Absolute: 0.5 10*3/uL (ref 0.1–1.0)
Monocytes Relative: 10 %
Neutro Abs: 3.5 10*3/uL (ref 1.7–7.7)
Neutrophils Relative %: 72 %
Platelet Count: 101 10*3/uL — ABNORMAL LOW (ref 150–400)
RBC: 4.48 MIL/uL (ref 3.87–5.11)
RDW: 14.3 % (ref 11.5–15.5)
WBC Count: 4.9 10*3/uL (ref 4.0–10.5)
nRBC: 0 % (ref 0.0–0.2)

## 2018-09-22 LAB — IRON AND TIBC
Iron: 153 ug/dL — ABNORMAL HIGH (ref 41–142)
Saturation Ratios: 43 % (ref 21–57)
TIBC: 359 ug/dL (ref 236–444)
UIBC: 205 ug/dL (ref 120–384)

## 2018-09-22 LAB — FERRITIN: Ferritin: 93 ng/mL (ref 11–307)

## 2018-11-01 ENCOUNTER — Other Ambulatory Visit: Payer: Self-pay

## 2018-11-04 ENCOUNTER — Ambulatory Visit: Payer: Medicaid Other | Admitting: Obstetrics & Gynecology

## 2018-11-04 ENCOUNTER — Telehealth: Payer: Self-pay | Admitting: Obstetrics & Gynecology

## 2018-11-04 NOTE — Telephone Encounter (Signed)
Patient [facility] today's appointment.

## 2018-11-04 NOTE — Progress Notes (Deleted)
23 y.o. Tammie Wade here for annual exam.    No LMP recorded.          Sexually active: {yes no:314532}  The current method of family planning is {contraception:315051}.    Exercising: {yes no:314532}  {types:19826} Smoker:  {YES P5382123  Health Maintenance: Pap:  01/24/18 neg  11/20/15 neg History of abnormal Pap:  No Gardasil:  TDaP:  *** Screening Labs: ***   reports that she has never smoked. She has never used smokeless tobacco. She reports that she does not drink alcohol or use drugs.  Past Medical History:  Diagnosis Date  . Abnormal uterine bleeding   . ASD (atrial septal defect)   . Blood transfusion without reported diagnosis    11 yrs at [facility] in [place] -4 units  . Bronchopulmonary dysplasia   . Chronic lung disease   . Congenital thrombocytopenia ([place])   . COPD (chronic obstructive pulmonary disease) ([place])   . Emphysema lung ([place])   . Emphysema of lung ([place])   . 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 History of seizures   . Learning disabilities   . Meconium aspiration   . Prediabetes   . S/P repair of PDA   . Thrombocytopenia ([place])   . VSD (ventricular septal defect)     Past Surgical History:  Procedure Laterality Date  . 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 DILATATION & CURETTAGE/HYSTEROSCOPY WITH MYOSURE N/A 01/24/2018   Procedure: DILATATION & CURETTAGE/HYSTEROSCOPY , PAP SMEAR;  Surgeon: Megan Salon, MD;  Location: [facility];  Service: Gynecology;  Laterality: N/A;  Possible Myosure  . EYE SURGERY Bilateral    x 2 weak muscles  . PATENT DUCTUS ARTERIOUS REPAIR     newborn  . VSD REPAIR     newborn    Current Outpatient Medications  Medication Sig Dispense Refill  . albuterol (PROVENTIL HFA;VENTOLIN HFA) 108 (90 Base) MCG/ACT inhaler Inhale 2 puffs into the lungs every 6 (six) hours as needed for wheezing or shortness of  breath.    . Multiple Vitamin (MULTIVITAMIN WITH MINERALS) TABS tablet Take 1 tablet by mouth daily.     . norgestrel-ethinyl estradiol (LOW-OGESTREL) 0.3-30 MG-MCG tablet Take 1 tablet by mouth daily. Skipping placebo pills.  She is taking continuous active OCPs. 84 tablet 4  . umeclidinium-vilanterol (ANORO ELLIPTA) 62.5-25 MCG/INH AEPB Inhale 1 puff into the lungs daily. 1 each 3   No current facility-administered medications for this visit.     Family History  Problem Relation Age of Onset  . Osteopenia Mother   . Hypertension Mother   . Diabetes Mother   . Breast cancer Maternal Aunt   . Cancer Maternal Aunt   . Hyperlipidemia Maternal Aunt   . Diabetes Maternal Aunt   . Lung cancer Maternal Grandmother   . Hyperlipidemia Maternal Grandmother   . Diabetes Maternal Grandmother   . COPD Maternal Grandmother   . Emphysema Maternal Grandmother   . Sickle cell anemia Maternal Grandmother   . Hyperlipidemia Maternal Grandfather   . Diabetes Maternal Grandfather   . Hypertension Father   . Hyperlipidemia Father   . Renal Disease Father   . Sickle cell anemia Maternal Uncle   . Rheum arthritis Cousin     Review of Systems  Exam:   There were no vitals taken for  this visit.  Height:      Ht Readings from Last 3 Encounters:  07/12/18 _0  (1.676 m)  06/17/18 _1  (1.676 m)  06/08/18 _2  (1.676 m)    General appearance: alert, cooperative and appears stated age Head: Normocephalic, without obvious abnormality, atraumatic Neck: no adenopathy, supple, symmetrical, trachea midline and thyroid {EXAM; THYROID:18604} Lungs: clear to auscultation bilaterally Breasts: {Exam; breast:13139::"normal appearance, no masses or tenderness"} Heart: regular rate and rhythm Abdomen: soft, non-tender; bowel sounds normal; no masses,  no organomegaly Extremities: extremities normal, atraumatic, no cyanosis or edema Skin: Skin color, texture, turgor normal. No rashes or lesions Lymph  nodes: Cervical, supraclavicular, and axillary nodes normal. No abnormal inguinal nodes palpated Neurologic: Grossly normal   Pelvic: External genitalia:  no lesions              Urethra:  normal appearing urethra with no masses, tenderness or lesions              Bartholins and Skenes: normal                 Vagina: normal appearing vagina with normal color and discharge, no lesions              Cervix: {exam; cervix:14595}              Pap taken: {yes no:314532} Bimanual Exam:  Uterus:  {exam; uterus:12215}              Adnexa: {exam; adnexa:12223}               Rectovaginal: Confirms               Anus:  normal sphincter tone, no lesions  Chaperone was present for exam.  A:  Well Woman with normal exam  P:   {plan; gyn:5269::"mammogram","pap smear","return annually or prn"}

## 2018-11-15 ENCOUNTER — Other Ambulatory Visit: Payer: Self-pay | Admitting: Obstetrics & Gynecology

## 2018-11-15 NOTE — Telephone Encounter (Signed)
Medication refill request: OCP  Last AEX:  08/10/17 SM Next AEX: 12/07/18 SM Last MMG (if hormonal medication request): none Refill authorized: 05/27/18 #[id number]. To [place]. State patient transferred Rx to another pharmacy.  They pulled back Rx and will call pt when it is ready.   Left voicemail for patient. Pharmacy is getting Rx ready.

## 2018-11-15 NOTE — Telephone Encounter (Signed)
Medication refill request: OCP  Last AEX:  08/10/17 SM Next AEX: none Last MMG (if hormonal medication request): none Refill authorized: 2//21/20 #4 packs/4R to [employer] [facility] [address]

## 2018-11-15 NOTE — Telephone Encounter (Signed)
Patient is calling regarding refill for norgestrel-ethinyl estradiol. Patient rescheduled annual at time of call for 12/07/2018.

## 2018-12-06 ENCOUNTER — Other Ambulatory Visit: Payer: Self-pay

## 2018-12-07 ENCOUNTER — Encounter: Payer: Self-pay | Admitting: Obstetrics & Gynecology

## 2018-12-07 ENCOUNTER — Ambulatory Visit: Payer: Medicaid Other | Admitting: Obstetrics & Gynecology

## 2018-12-07 VITALS — BP 110/80 | HR 88 | Temp 97.7°F | Ht 64.25 in | Wt 135.2 lb

## 2018-12-07 DIAGNOSIS — Z01419 Encounter for gynecological examination (general) (routine) without abnormal findings: Secondary | ICD-10-CM | POA: Diagnosis not present

## 2018-12-07 DIAGNOSIS — N898 Other specified noninflammatory disorders of vagina: Secondary | ICD-10-CM

## 2018-12-07 DIAGNOSIS — Z0000 Encounter for general adult medical examination without abnormal findings: Secondary | ICD-10-CM

## 2018-12-07 NOTE — Progress Notes (Signed)
23 y.o. [place] female here for annual exam.  Had video visit with Dr. Lindi Adie in June.  Hb was 13.2, improved from 8.3.  Has received four IV iron infusions.  She is also being followed for idiopathic thrombocytopenia.    Cycles are much better.  She is taking her OCPs continuous active.  If she does not miss pills, she does not bleeding.  She has noticed some vaginal discharge.  Her mother has suggested that she "needs" to cycle.   Reminded pt that her hemoglobin was 8.3 earlier this year and she had to have four IV iron infusions.  Reports she used a vaginal "pill" that was OTC to help with the odor.  Using it caused her to have some bleeding.  The bleeding didn't last very long.  Thinks she may have scratched her vaginal sidewall.  Pt called her mother when she was here and mother reports it was boric acid.    Patient's last menstrual period was 05/13/2018 (exact date).          Sexually active: No.  The current method of family planning is OCP (estrogen/progesterone).    Exercising: Yes.    walking Smoker:  no  Health Maintenance: Pap:  01/24/18 Neg   11/20/15 Neg  History of abnormal Pap:  no Gardasil: had 2 TDaP:  ? Screening Labs: PCP   reports that she has never smoked. She has never used smokeless tobacco. She reports that she does not drink alcohol or use drugs.  Past Medical History:  Diagnosis Date  . Abnormal uterine bleeding   . ASD (atrial septal defect)   . Blood transfusion without reported diagnosis    13 yrs at [facility] in [place] -4 units  . Bronchopulmonary dysplasia   . Chronic lung disease   . Congenital thrombocytopenia ([place])   . COPD (chronic obstructive pulmonary disease) ([place])   . Emphysema lung ([place])   . Emphysema of lung ([place])   . 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 History of seizures   . Learning disabilities   . Meconium aspiration   . Prediabetes   . S/P repair of PDA   .  Thrombocytopenia ([place])   . VSD (ventricular septal defect)     Past Surgical History:  Procedure Laterality Date  . CARDIAC SURGERY     open heart at 14 weeks old - bottom 2 chambers of heart were closed  . DILATATION & CURETTAGE/HYSTEROSCOPY WITH MYOSURE N/A 01/24/2018   Procedure: DILATATION & CURETTAGE/HYSTEROSCOPY , PAP SMEAR;  Surgeon: Megan Salon, MD;  Location: [facility];  Service: Gynecology;  Laterality: N/A;  Possible Myosure  . EYE SURGERY Bilateral    x 2 weak muscles  . PATENT DUCTUS ARTERIOUS REPAIR     newborn  . VSD REPAIR     newborn    Current Outpatient Medications  Medication Sig Dispense Refill  . albuterol (PROVENTIL HFA;VENTOLIN HFA) 108 (90 Base) MCG/ACT inhaler Inhale 2 puffs into the lungs every 6 (six) hours as needed for wheezing or shortness of breath.    . cetirizine (ZYRTEC) 10 MG tablet Take 10 mg by mouth daily as needed for allergies.    . norgestrel-ethinyl estradiol (LOW-OGESTREL) 0.3-30 MG-MCG tablet Take 1 tablet by mouth daily. Skipping placebo pills.  She is taking continuous active OCPs. 84 tablet 4  . umeclidinium-vilanterol (ANORO ELLIPTA) 62.5-25 MCG/INH AEPB Inhale 1 puff into the lungs daily.  1 each 3   No current facility-administered medications for this visit.     Family History  Problem Relation Age of Onset  . Osteopenia Mother   . Hypertension Mother   . Diabetes Mother   . Breast cancer Maternal Aunt   . Cancer Maternal Aunt   . Hyperlipidemia Maternal Aunt   . Diabetes Maternal Aunt   . Lung cancer Maternal Grandmother   . Hyperlipidemia Maternal Grandmother   . Diabetes Maternal Grandmother   . COPD Maternal Grandmother   . Emphysema Maternal Grandmother   . Sickle cell anemia Maternal Grandmother   . Hyperlipidemia Maternal Grandfather   . Diabetes Maternal Grandfather   . Hypertension Father   . Hyperlipidemia Father   . Renal Disease Father   . Sickle cell anemia Maternal Uncle   . Rheum arthritis Cousin      Review of Systems  Genitourinary: Positive for vaginal discharge.       Odor  All other systems reviewed and are negative.   Exam:   BP 110/80   Pulse 88   Temp 97.7 F (36.5 C) (Temporal)   Ht 5' 4.25" (1.632 m)   Wt 135 lb 3.2 oz (61.3 kg)   LMP 05/13/2018 (Exact Date)   BMI 23.03 kg/m   Height: 5' 4.25" (163.2 cm)  Ht Readings from Last 3 Encounters:  12/07/18 5' 4.25" (1.632 m)  07/12/18 _0  (1.676 m)  06/17/18 _1  (1.676 m)    General appearance: alert, cooperative and appears stated age Head: Normocephalic, without obvious abnormality, atraumatic Neck: no adenopathy, supple, symmetrical, trachea midline and thyroid normal to inspection and palpation Lungs: clear to auscultation bilaterally Breasts: normal appearance, no masses or tenderness Heart: regular rate and rhythm Abdomen: soft, non-tender; bowel sounds normal; no masses,  no organomegaly Extremities: extremities normal, atraumatic, no cyanosis or edema Skin: Skin color, texture, turgor normal. No rashes or lesions Lymph nodes: Cervical, supraclavicular, and axillary nodes normal. No abnormal inguinal nodes palpated Neurologic: Grossly normal   Pelvic: External genitalia:  no lesions              Urethra:  normal appearing urethra with no masses, tenderness or lesions              Bartholins and Skenes: normal                 Vagina: normal appearing vagina with normal color, [place] discharge present, no significant odor noted, no lesions              Cervix: no lesions              Pap taken: No. Bimanual Exam:  Uterus:  normal size, contour, position, consistency, mobility, non-tender              Adnexa: normal adnexa and no mass, fullness, tenderness               Rectovaginal: Confirms               Anus:  normal sphincter tone, no lesions  Chaperone was present for exam.  A:  Well Woman with normal exam H/o menorrhagia, on continuous active OCPs. H/o anemia, resolved, and h/o iron  deficiency that is much improved after 4 iron infusions Emphysema, followed by Dr. Elsworth Soho  P:   Mammogram guidelines reviewed.  Will start at age 56. pap smear neg 2019.  Not indicated today. Affirm swab obtained.   Will continue OCPs at continuous active  at this point.  Rx not needed at this point. return annually or prn

## 2018-12-08 LAB — VAGINITIS/VAGINOSIS, DNA PROBE
Candida Species: NEGATIVE
Gardnerella vaginalis: NEGATIVE
Trichomonas vaginosis: NEGATIVE

## 2019-01-06 ENCOUNTER — Ambulatory Visit (HOSPITAL_COMMUNITY)
Admission: EM | Admit: 2019-01-06 | Discharge: 2019-01-06 | Disposition: A | Discharge status: Home / Self Care | Payer: Medicaid Other | Attending: Physician Assistant | Admitting: Physician Assistant

## 2019-01-06 ENCOUNTER — Other Ambulatory Visit: Payer: Self-pay

## 2019-01-06 ENCOUNTER — Encounter (HOSPITAL_COMMUNITY): Payer: Self-pay

## 2019-01-06 DIAGNOSIS — R05 Cough: Secondary | ICD-10-CM | POA: Diagnosis present

## 2019-01-06 DIAGNOSIS — J321 Chronic frontal sinusitis: Secondary | ICD-10-CM

## 2019-01-06 DIAGNOSIS — Z793 Long term (current) use of hormonal contraceptives: Secondary | ICD-10-CM | POA: Insufficient documentation

## 2019-01-06 DIAGNOSIS — Z8249 Family history of ischemic heart disease and other diseases of the circulatory system: Secondary | ICD-10-CM | POA: Insufficient documentation

## 2019-01-06 DIAGNOSIS — J439 Emphysema, unspecified: Secondary | ICD-10-CM | POA: Diagnosis not present

## 2019-01-06 DIAGNOSIS — T732XXA Exhaustion due to exposure, initial encounter: Secondary | ICD-10-CM

## 2019-01-06 DIAGNOSIS — K219 Gastro-esophageal reflux disease without esophagitis: Secondary | ICD-10-CM | POA: Diagnosis not present

## 2019-01-06 DIAGNOSIS — Z20828 Contact with and (suspected) exposure to other viral communicable diseases: Secondary | ICD-10-CM | POA: Insufficient documentation

## 2019-01-06 DIAGNOSIS — R059 Cough, unspecified: Secondary | ICD-10-CM

## 2019-01-06 DIAGNOSIS — Z79899 Other long term (current) drug therapy: Secondary | ICD-10-CM | POA: Diagnosis not present

## 2019-01-06 DIAGNOSIS — M419 Scoliosis, unspecified: Secondary | ICD-10-CM | POA: Insufficient documentation

## 2019-01-06 DIAGNOSIS — X58XXXA Exposure to other specified factors, initial encounter: Secondary | ICD-10-CM | POA: Diagnosis not present

## 2019-01-06 HISTORY — DX: Unspecified asthma, uncomplicated: J45.909

## 2019-01-06 HISTORY — DX: Allergy, unspecified, initial encounter: T78.40XA

## 2019-01-06 MED ORDER — PREDNISONE 20 MG PO TABS: 20.0000 mg | ORAL_TABLET | Freq: Every day | ORAL | 0 refills | Status: DC

## 2019-01-06 MED ORDER — CEFDINIR 300 MG PO CAPS: 300.0000 mg | ORAL_CAPSULE | Freq: Two times a day (BID) | ORAL | 0 refills | Status: AC

## 2019-01-06 NOTE — ED Provider Notes (Signed)
[place]    CSN: [id number] Arrival date & time: 01/06/19  1236      History   Chief Complaint Chief Complaint  Patient presents with  . Cough  . Chest Pain  . Diarrhea  . Fatigue    HPI Tammie Wade is a 23 y.o. female.   Who carries a history of asthma. She presents with initially cold like symptoms to include a sore throat and non-productive cough. Now with progression to pain in her cheeks, sinus congestion and post nasal drip. Cough remains that is productive green at times. She also has fatigue and mild diarrhea without fevers or chills. No abdominal pain. See remainder of ROS.      Past Medical History:  Diagnosis Date  . Abnormal uterine bleeding   . Allergy   . ASD (atrial septal defect)   . Asthma   . Blood transfusion without reported diagnosis    13 yrs at [facility] in [place] -4 units  . Bronchopulmonary dysplasia   . Chronic lung disease   . 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 COPD (chronic obstructive pulmonary disease) ([place])   . Emphysema lung ([place])   . Emphysema of lung ([place])   . 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 History of seizures   . Learning disabilities   . Meconium aspiration   . Prediabetes   . S/P repair of PDA   . Thrombocytopenia ([place])   . VSD (ventricular septal defect)     Patient Active Problem List   Diagnosis Date Noted  . Anemia 05/19/2018  . Iron deficiency anemia 05/19/2018  . 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 Weight loss 05/19/2018  . S/P PDA repair 01/02/2018  . Scoliosis 01/02/2018  . VSD (ventricular septal defect) 08/26/2017  . PDA (patent ductus arteriosus) 08/26/2017  . Family history of tuberous sclerosis 09/23/2016  . Menorrhagia with irregular cycle 11/20/2015  . 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 COPD, severe ([place]) 07/12/2015  . Emphysema lung ([place]) 07/12/2015  . GERD (gastroesophageal reflux disease) 07/12/2015  .  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 Asthma, mild intermittent 01/02/2004  . 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 Difficulty hearing 06/22/2003  . ASD (atrial septal defect), ostium secundum 01/06/1994    Past Surgical History:  Procedure Laterality Date  . 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 DILATATION & CURETTAGE/HYSTEROSCOPY WITH MYOSURE N/A 01/24/2018   Procedure: DILATATION & CURETTAGE/HYSTEROSCOPY , PAP SMEAR;  Surgeon: Megan Salon, MD;  Location: [facility];  Service: Gynecology;  Laterality: N/A;  Possible Myosure  . EYE SURGERY Bilateral    x 2 weak muscles  . PATENT DUCTUS ARTERIOUS REPAIR     newborn  . VSD REPAIR     newborn    OB History    Gravida  0   Para  0   Term  0   Preterm  0   AB  0   Living  0     SAB  0   TAB  0   Ectopic  0   Multiple  0   Live Births               Home Medications    Prior to Admission medications   Medication Sig Start Date End Date Taking? Authorizing Provider  albuterol (  PROVENTIL HFA;VENTOLIN HFA) 108 (90 Base) MCG/ACT inhaler Inhale 2 puffs into the lungs every 6 (six) hours as needed for wheezing or shortness of breath.    [provider]  cetirizine (ZYRTEC) 10 MG tablet Take 10 mg by mouth daily as needed for allergies.    [provider]  norgestrel-ethinyl estradiol (LOW-OGESTREL) 0.3-30 MG-MCG tablet Take 1 tablet by mouth daily. Skipping placebo pills.  She is taking continuous active OCPs. 05/27/18   Megan Salon, MD  umeclidinium-vilanterol [facility] ELLIPTA) 62.5-25 MCG/INH AEPB Inhale 1 puff into the lungs daily. 07/14/18   Rigoberto Noel, MD    Family History Family History  Problem Relation Age of Onset  . Osteopenia Mother   . Hypertension Mother   . Diabetes Mother   . Breast cancer Maternal Aunt   . Cancer Maternal Aunt   . Hyperlipidemia Maternal Aunt   . Diabetes Maternal  Aunt   . Lung cancer Maternal Grandmother   . Hyperlipidemia Maternal Grandmother   . Diabetes Maternal Grandmother   . COPD Maternal Grandmother   . Emphysema Maternal Grandmother   . Sickle cell anemia Maternal Grandmother   . Hyperlipidemia Maternal Grandfather   . Diabetes Maternal Grandfather   . Hypertension Father   . Hyperlipidemia Father   . Renal Disease Father   . Sickle cell anemia Maternal Uncle   . Rheum arthritis Cousin     Social History Social History   Tobacco Use  . Smoking status: Never Smoker  . Smokeless tobacco: Never Used  Substance Use Topics  . Alcohol use: No    Alcohol/week: 0.0 standard drinks  . Drug use: No     Allergies   Patient has no known allergies.   Review of Systems Review of Systems  Constitutional: Positive for fatigue and fever.  HENT: Positive for congestion, postnasal drip, sinus pain and sore throat.   Eyes: Negative for pain and discharge.  Respiratory: Positive for cough. Negative for apnea, chest tightness, shortness of breath and wheezing.   Cardiovascular: Negative for chest pain.  Gastrointestinal: Positive for diarrhea. Negative for abdominal pain, blood in stool and constipation.  Musculoskeletal: Positive for myalgias.  Allergic/Immunologic: Positive for environmental allergies.  Neurological: Negative.   Psychiatric/Behavioral: Negative.      Physical Exam Triage Vital Signs ED Triage Vitals  Enc Vitals Group     BP 01/06/19 1306 (!) 125/93     Pulse Rate 01/06/19 1306 (!) 103     Resp 01/06/19 1306 (!) 22     Temp 01/06/19 1306 99.3 F (37.4 C)     Temp Source 01/06/19 1306 Oral     SpO2 01/06/19 1306 98 %     Weight --      Height --      Head Circumference --      Peak Flow --      Pain Score 01/06/19 1307 5     Pain Loc --      Pain Edu? --      Excl. in [place]? --    No data found.  Updated Vital Signs BP (!) 125/93 (BP Location: Left Arm)   Pulse (!) 103   Temp 99.3 F (37.4 C) (Oral)    Resp (!) 22   SpO2 98%   Visual Acuity Right Eye Distance:   Left Eye Distance:   Bilateral Distance:    Right Eye Near:   Left Eye Near:    Bilateral Near:     Physical Exam  Vitals signs and nursing note reviewed.  Constitutional:      General: She is not in acute distress.    Appearance: She is well-developed. She is not ill-appearing, toxic-appearing or diaphoretic.  HENT:     Head: Normocephalic and atraumatic.  Eyes:     Extraocular Movements: Extraocular movements intact.  Cardiovascular:     Rate and Rhythm: Normal rate and regular rhythm.  Pulmonary:     Effort: Pulmonary effort is normal. No tachypnea or respiratory distress.     Breath sounds: No stridor.  Abdominal:     General: Bowel sounds are normal.     Palpations: Abdomen is soft.  Skin:    General: Skin is warm and dry.  Neurological:     General: No focal deficit present.     Mental Status: She is alert.  Psychiatric:        Mood and Affect: Mood normal.        Behavior: Behavior normal.      UC Treatments / Results  Labs (all labs ordered are listed, but only abnormal results are displayed) Labs Reviewed  NOVEL CORONAVIRUS, NAA (HOSP ORDER, SEND-OUT TO REF LAB; TAT 18-24 HRS)    EKG   Radiology No results found.  Procedures Procedures (including critical care time)  Medications Ordered in UC Medications - No data to display  Initial Impression / Assessment and Plan / UC Course  I have reviewed the triage vital signs and the nursing notes.  Pertinent labs & imaging results that were available during my care of the patient were reviewed by me and considered in my medical decision making (see chart for details).     Viral infection now with sinusitis. Treat with antibiotic and prednisone. Covid test pending/ FU if needed.  Final Clinical Impressions(s) / UC Diagnoses   Final diagnoses:  None   Discharge Instructions   None    ED Prescriptions    None     PDMP not  reviewed this encounter.   Bjorn Pippin, PA-C 01/06/19 1344

## 2019-01-06 NOTE — ED Triage Notes (Signed)
Pt presents with ongoing non productive cough, central chest pain, diarrhea, and fatigue for about 2 weeks.  Pt states she was around another family member that had a common cold.  Pt caretaker in lobby.

## 2019-01-06 NOTE — Discharge Instructions (Addendum)
You now have a bacterial sinus infection. Treat with antibiotics and prednisone. Use Delsym over the counter as directed for cough. Continue on your asthma medications. Stay hydrated and f/u if worsens.

## 2019-01-07 LAB — NOVEL CORONAVIRUS, NAA (HOSP ORDER, SEND-OUT TO REF LAB; TAT 18-24 HRS): SARS-CoV-2, NAA: NOT DETECTED

## 2019-01-09 ENCOUNTER — Encounter (HOSPITAL_COMMUNITY): Payer: Self-pay

## 2019-01-10 ENCOUNTER — Telehealth: Payer: Self-pay

## 2019-01-10 NOTE — Telephone Encounter (Signed)
Spoke with the pts mother and she has not been able to get the pt set up with Dr. Richrd Humbles with [facility].... they have been not returning her calls and offered her an appt in [place] at some point but she declined since she would like to be seen in [place]... their office had already closed for the day.. will call first thing in the morning and call the patients mother back ASAP.   [phone]

## 2019-01-11 ENCOUNTER — Ambulatory Visit: Payer: Medicaid Other

## 2019-01-11 NOTE — Telephone Encounter (Signed)
Spoke with Lattie Haw at Dr. Leota Sauers office this morning [phone], and she is going to talk with him tomorrow when he is in [place] to see if he can work the pt in to his next office day in [place] which is February 16, 2019.. I will call her late morning tomorrow and I advised the pts mother I will call her back when I find out when he can see her. She verbalized understanding and agreed.

## 2019-01-12 NOTE — Telephone Encounter (Signed)
Called and spoke to Tammie Wade at Dr. [name] office regarding him seeing the patient in the [place] office. She will reach out to the patient and schedule an appt.    Followed up with patient's mom (DPR on file). The patient has been scheduled in the [facility] with Dr. [name] on 10/21.

## 2019-01-12 NOTE — Telephone Encounter (Signed)
Called Dr. Leota Sauers office [phone] and Lattie Haw has not had a chance to ask him about working the pt in, on his next [place] office date 11/12... he has been busy seeing pts since he walked in... she asked if we could call her back this afternoon.

## 2019-01-17 ENCOUNTER — Other Ambulatory Visit: Payer: Self-pay

## 2019-01-17 ENCOUNTER — Ambulatory Visit: Payer: Medicaid Other | Attending: Family Medicine | Admitting: Pharmacist

## 2019-01-17 DIAGNOSIS — Z23 Encounter for immunization: Secondary | ICD-10-CM | POA: Diagnosis not present

## 2019-01-17 NOTE — Progress Notes (Signed)
Patient presents for vaccination against influenza per orders of Dr. Chapman Fitch. Consent given. Counseling provided. No contraindications exists. Vaccine administered without incident.

## 2019-02-02 ENCOUNTER — Other Ambulatory Visit: Payer: Self-pay

## 2019-02-02 ENCOUNTER — Encounter: Payer: Self-pay | Admitting: Family Medicine

## 2019-02-02 ENCOUNTER — Ambulatory Visit: Payer: Medicaid Other | Attending: Family Medicine | Admitting: Family Medicine

## 2019-02-02 VITALS — BP 114/69 | HR 68 | Temp 99.2°F | Resp 18 | Ht 66.0 in | Wt 137.0 lb

## 2019-02-02 DIAGNOSIS — Z114 Encounter for screening for human immunodeficiency virus [HIV]: Secondary | ICD-10-CM

## 2019-02-02 DIAGNOSIS — Z13220 Encounter for screening for lipoid disorders: Secondary | ICD-10-CM

## 2019-02-02 DIAGNOSIS — Z0000 Encounter for general adult medical examination without abnormal findings: Secondary | ICD-10-CM | POA: Diagnosis not present

## 2019-02-02 DIAGNOSIS — D500 Iron deficiency anemia secondary to blood loss (chronic): Secondary | ICD-10-CM

## 2019-02-02 DIAGNOSIS — E8809 Other disorders of plasma-protein metabolism, not elsewhere classified: Secondary | ICD-10-CM | POA: Diagnosis not present

## 2019-02-02 DIAGNOSIS — D696 Thrombocytopenia, unspecified: Secondary | ICD-10-CM

## 2019-02-02 NOTE — Patient Instructions (Signed)
Preventive Care 21-25 Years Old, Female Preventive care refers to visits with your health care provider and lifestyle choices that can promote health and wellness. This includes:  A yearly physical exam. This may also be called an annual well check.  Regular dental visits and eye exams.  Immunizations.  Screening for certain conditions.  Healthy lifestyle choices, such as eating a healthy diet, getting regular exercise, not using drugs or products that contain nicotine and tobacco, and limiting alcohol use. What can I expect for my preventive care visit? Physical exam Your health care provider will check your:  Height and weight. This may be used to calculate body mass index (BMI), which tells if you are at a healthy weight.  Heart rate and blood pressure.  Skin for abnormal spots. Counseling Your health care provider may ask you questions about your:  Alcohol, tobacco, and drug use.  Emotional well-being.  Home and relationship well-being.  Sexual activity.  Eating habits.  Work and work environment.  Method of birth control.  Menstrual cycle.  Pregnancy history. What immunizations do I need?  Influenza (flu) vaccine  This is recommended every year. Tetanus, diphtheria, and pertussis (Tdap) vaccine  You may need a Td booster every 10 years. Varicella (chickenpox) vaccine  You may need this if you have not been vaccinated. Human papillomavirus (HPV) vaccine  If recommended by your health care provider, you may need three doses over 6 months. Measles, mumps, and rubella (MMR) vaccine  You may need at least one dose of MMR. You may also need a second dose. Meningococcal conjugate (MenACWY) vaccine  One dose is recommended if you are age 19-21 years and a first-year college student living in a residence hall, or if you have one of several medical conditions. You may also need additional booster doses. Pneumococcal conjugate (PCV13) vaccine  You may need  this if you have certain conditions and were not previously vaccinated. Pneumococcal polysaccharide (PPSV23) vaccine  You may need one or two doses if you smoke cigarettes or if you have certain conditions. Hepatitis A vaccine  You may need this if you have certain conditions or if you travel or work in places where you may be exposed to hepatitis A. Hepatitis B vaccine  You may need this if you have certain conditions or if you travel or work in places where you may be exposed to hepatitis B. Haemophilus influenzae type b (Hib) vaccine  You may need this if you have certain conditions. You may receive vaccines as individual doses or as more than one vaccine together in one shot (combination vaccines). Talk with your health care provider about the risks and benefits of combination vaccines. What tests do I need?  Blood tests  Lipid and cholesterol levels. These may be checked every 5 years starting at age 20.  Hepatitis C test.  Hepatitis B test. Screening  Diabetes screening. This is done by checking your blood sugar (glucose) after you have not eaten for a while (fasting).  Sexually transmitted disease (STD) testing.  BRCA-related cancer screening. This may be done if you have a family history of breast, ovarian, tubal, or peritoneal cancers.  Pelvic exam and Pap test. This may be done every 3 years starting at age 21. Starting at age 30, this may be done every 5 years if you have a Pap test in combination with an HPV test. Talk with your health care provider about your test results, treatment options, and if necessary, the need for more tests.   Follow these instructions at home: Eating and drinking   Eat a diet that includes fresh fruits and vegetables, whole grains, lean protein, and low-fat dairy.  Take vitamin and mineral supplements as recommended by your health care provider.  Do not drink alcohol if: ? Your health care provider tells you not to drink. ? You are  pregnant, may be pregnant, or are planning to become pregnant.  If you drink alcohol: ? Limit how much you have to 0-1 drink a day. ? Be aware of how much alcohol is in your drink. In the [place] one drink equals one 12 oz bottle of beer (355 mL), one 5 oz glass of wine (148 mL), or one 1 oz glass of hard liquor (44 mL). Lifestyle  Take daily care of your teeth and gums.  Stay active. Exercise for at least 30 minutes on 5 or more days each week.  Do not use any products that contain nicotine or tobacco, such as cigarettes, e-cigarettes, and chewing tobacco. If you need help quitting, ask your health care provider.  If you are sexually active, practice safe sex. Use a condom or other form of birth control (contraception) in order to prevent pregnancy and STIs (sexually transmitted infections). If you plan to become pregnant, see your health care provider for a preconception visit. What's next?  Visit your health care provider once a year for a well check visit.  Ask your health care provider how often you should have your eyes and teeth checked.  Stay up to date on all vaccines. This information is not intended to replace advice given to you by your health care provider. Make sure you discuss any questions you have with your health care provider. Document Released: 05/19/2001 Document Revised: 12/02/2017 Document Reviewed: 12/02/2017 Elsevier Patient Education  2020 [employer]  

## 2019-02-02 NOTE — Progress Notes (Signed)
Established Patient Office Visit  Subjective:  Patient ID: Tammie Wade, female    DOB: July 23, 1995  Age: 23 y.o. MRN: [id number]  CC:  Chief Complaint  Patient presents with  . Annual Exam    HPI Tammie Wade, 23 year old female with past medical history significant for atrial septal defect and ventral septal defect status post repair along with pulmonary hypertension for which she is followed by [facility] cardiology.  She presents today for well exam.  She reports that she is feeling well.  She reports that her breathing is improved since she was placed on Anoro.  Per chart, she also has diagnosis of COPD/emphysema.  She reports no current issues with pelvic or vaginal pain.  She denies finding any abnormalities on self breast exam and declines breast exam.  She does have some issues with chronic back pain secondary to history of scoliosis.  She also has history of thrombocytopenia and denies any unusual bruising or bleeding at this time.  She denies any issues with chest pain or palpitations, shortness of breath is improved.  She denies any current cough.  No recent fever or chills.  She denies any sore throat or difficulty swallowing.  No current issues with abdominal pain-no nausea/vomiting/diarrhea or constipation.  No blood in the stool or black stools.  She denies any urinary frequency, urgency or dysuria.  Past Medical History:  Diagnosis Date  . Abnormal uterine bleeding   . Allergy   . ASD (atrial septal defect)   . Asthma   . Blood transfusion without reported diagnosis    13 yrs at [facility] in [place] -4 units  . Bronchopulmonary dysplasia   . Chronic lung disease   . 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 COPD (chronic obstructive pulmonary disease) ([place])   . Emphysema lung ([place])   . Emphysema of lung ([place])   . 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 History of seizures   . Learning disabilities   . Meconium aspiration   . Prediabetes    . S/P repair of PDA   . Thrombocytopenia ([place])   . VSD (ventricular septal defect)     Past Surgical History:  Procedure Laterality Date  . 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 DILATATION & CURETTAGE/HYSTEROSCOPY WITH MYOSURE N/A 01/24/2018   Procedure: DILATATION & CURETTAGE/HYSTEROSCOPY , PAP SMEAR;  Surgeon: Megan Salon, MD;  Location: [facility];  Service: Gynecology;  Laterality: N/A;  Possible Myosure  . EYE SURGERY Bilateral    x 2 weak muscles  . PATENT DUCTUS ARTERIOUS REPAIR     newborn  . VSD REPAIR     newborn    Family History  Problem Relation Age of Onset  . Osteopenia Mother   . Hypertension Mother   . Diabetes Mother   . Breast cancer Maternal Aunt   . Cancer Maternal Aunt   . Hyperlipidemia Maternal Aunt   . Diabetes Maternal Aunt   . Lung cancer Maternal Grandmother   . Hyperlipidemia Maternal Grandmother   . Diabetes Maternal Grandmother   . COPD Maternal Grandmother   . Emphysema Maternal Grandmother   . Sickle cell anemia Maternal Grandmother   . Hyperlipidemia Maternal Grandfather   . Diabetes Maternal Grandfather   . Hypertension Father   . Hyperlipidemia Father   . Renal Disease Father   . Sickle cell anemia  Maternal Uncle   . Rheum arthritis Cousin     Social History   Socioeconomic History  . Marital status: Single    Spouse name: Not on file  . Number of children: Not on file  . Years of education: Not on file  . Highest education level: Not on file  Occupational History  . Not on file  Social Needs  . Financial resource strain: Not on file  . Food insecurity    Worry: Not on file    Inability: Not on file  . Transportation needs    Medical: Not on file    Non-medical: Not on file  Tobacco Use  . Smoking status: Never Smoker  . Smokeless tobacco: Never Used  Substance and Sexual Activity  . Alcohol use: No    Alcohol/week: 0.0 standard drinks  . Drug use: No  . Sexual  activity: Never    Birth control/protection: Pill, Abstinence  Lifestyle  . Physical activity    Days per week: Not on file    Minutes per session: Not on file  . Stress: Not on file  Relationships  . Social Herbalist on phone: Not on file    Gets together: Not on file    Attends religious service: Not on file    Active member of club or organization: Not on file    Attends meetings of clubs or organizations: Not on file    Relationship status: Not on file  . Intimate partner violence    Fear of current or ex partner: Not on file    Emotionally abused: Not on file    Physically abused: Not on file    Forced sexual activity: Not on file  Other Topics Concern  . Not on file  Social History Narrative   Originally from [place]. Moved to [place] from [facility] in 2016. Has also lived in [place]. No pets currently. No bird or hot tub exposure. Remotely had problems with mold in an apartment from [place] until 2007 in a bathroom. Does have carpet. Enjoys playing on her phone.     Outpatient Medications Prior to Visit  Medication Sig Dispense Refill  . albuterol (PROVENTIL HFA;VENTOLIN HFA) 108 (90 Base) MCG/ACT inhaler Inhale 2 puffs into the lungs every 6 (six) hours as needed for wheezing or shortness of breath.    . cetirizine (ZYRTEC) 10 MG tablet Take 10 mg by mouth daily as needed for allergies.    . norgestrel-ethinyl estradiol (LOW-OGESTREL) 0.3-30 MG-MCG tablet Take 1 tablet by mouth daily. Skipping placebo pills.  She is taking continuous active OCPs. 84 tablet 4  . umeclidinium-vilanterol (ANORO ELLIPTA) 62.5-25 MCG/INH AEPB Inhale 1 puff into the lungs daily. 1 each 3   No facility-administered medications prior to visit.     No Known Allergies  ROS Review of Systems  Constitutional: Negative for chills, fatigue and fever.  HENT: Positive for hearing loss. Negative for sore throat and trouble swallowing.   Eyes: Negative for photophobia and visual disturbance.  Respiratory: Negative  for cough and shortness of breath.   Cardiovascular: Negative for chest pain, palpitations and leg swelling.  Gastrointestinal: Negative for abdominal pain, blood in stool, constipation and diarrhea.  Endocrine: Negative for cold intolerance, heat intolerance, polydipsia, polyphagia and polyuria.  Genitourinary: Negative for dysuria, flank pain and frequency.  Musculoskeletal: Positive for back pain (Occasional, has history of scoliosis).  Skin: Negative for rash and wound.  Neurological: Negative for dizziness and headaches.  Hematological: Negative for  adenopathy. Does not bruise/bleed easily.  Psychiatric/Behavioral: Negative for self-injury and suicidal ideas. The patient is not nervous/anxious.       Objective:    Physical Exam  Constitutional: She is oriented to person, place, and time. She appears well-developed and well-nourished.  WNWD young adult female in NAD sitting on exam table wearing mask as per office COVID-19 precautions  Eyes: Conjunctivae are normal.  Right eye with dysconjugate gaze  Neck: Normal range of motion. Neck supple. No JVD present. No thyromegaly present.  Cardiovascular: Normal rate and regular rhythm.  Pulmonary/Chest: Effort normal and breath sounds normal.  Abdominal: Soft. There is no abdominal tenderness. There is no rebound and no guarding.  Musculoskeletal:        General: No tenderness or edema.     Comments: No CVA tenderness  Lymphadenopathy:    She has no cervical adenopathy.  Neurological: She is alert and oriented to person, place, and time.  Skin: Skin is warm and dry.  Psychiatric: She has a normal mood and affect. Her behavior is normal.  Has slow speech pattern  Nursing note and vitals reviewed.   BP 114/69 (BP Location: Right Arm, Patient Position: Sitting, Cuff Size: Normal)   Pulse 68   Temp 99.2 F (37.3 C) (Oral)   Resp 18   Ht _0  (1.676 m)   Wt 137 lb (62.1 kg)   LMP 12/25/2018 Comment: period EOM  SpO2 99%   BMI  22.11 kg/m  Wt Readings from Last 3 Encounters:  02/02/19 137 lb (62.1 kg)  12/07/18 135 lb 3.2 oz (61.3 kg)  07/12/18 125 lb 6.4 oz (56.9 kg)     Health Maintenance Due  Topic Date Due  . HIV Screening  11/04/2008   HIV screening done at today's visit. Patient reports that she has had her influenza immunization already  Lab Results  Component Value Date   TSH 1.430 07/01/2017   Lab Results  Component Value Date   WBC 4.9 09/22/2018   HGB 13.2 09/22/2018   HCT 41.8 09/22/2018   MCV 93.3 09/22/2018   PLT 101 (L) 09/22/2018   Lab Results  Component Value Date   NA 135 05/20/2018   K 4.0 05/20/2018   CO2 19 (L) 05/20/2018   GLUCOSE 93 05/20/2018   BUN 8 05/20/2018   CREATININE 0.60 05/20/2018   BILITOT 1.2 05/20/2018   ALKPHOS 20 (L) 05/20/2018   AST 21 05/20/2018   ALT 21 05/20/2018   PROT 6.4 (L) 05/20/2018   ALBUMIN 3.3 (L) 05/20/2018   CALCIUM 8.4 (L) 05/20/2018   ANIONGAP 10 05/20/2018   Lab Results  Component Value Date   CHOL 120 (L) 07/02/2015   Lab Results  Component Value Date   HDL 49 07/02/2015   Lab Results  Component Value Date   LDLCALC 53 07/02/2015   Lab Results  Component Value Date   TRIG 91 07/02/2015   Lab Results  Component Value Date   CHOLHDL 2.4 07/02/2015   Lab Results  Component Value Date   HGBA1C 5.6 07/01/2017      Assessment & Plan:  1. Iron deficiency anemia due to chronic blood loss; thrombocytopenia Patient with history of iron deficiency anemia but most recent CBC was normal.  She is status post procedure by GYN in 2019 to decrease heaviness of menstrual bleeding.  Patient also with history of thrombocytopenia with last platelet count of 101 in June of this year and CBC will be repeated at today's visit.  She will be notified of the results and if any further interventions are needed based on the results. - CBC  2. Hypoalbuminemia Patient with low albumin of 3.3 earlier this year February and this will be  repeated to make sure that she is not having any nutritional difficulties.  At today's visit she reports no changes in appetite and feels that her appetite is good at this time.  Patient will have comprehensive metabolic panel in follow-up of long-term use of medications as well as in follow-up of her hypoalbuminemia - Comprehensive metabolic panel  3. Screening for lipid disorders Lipid panel will be done at today's visit as a screening test for lipoid disorders and she will be notified if any further interventions are needed based on her results.  She is encouraged to continue healthy, low-fat diet. - Lipid panel  4. Well adult exam Patient reports no significant issues at today's visit.  She is status post having normal Pap smear done by GYN on 01/24/2018 and she also had endometrial biopsy done at that time which was normal.  Patient declines breast exam and reports no abnormalities on self breast exam and no current axillary nodules, skin changes or nipple discharge.  Patient reports no pelvic or vaginal pain and no issues with vaginal discharge.  Pelvic exam was not done as patient had no complaints and normal Pap smear in 2019.  She reports that she has had her influenza immunization already.  She will have labs done at today's visit including lipid panel as a screening test for lipoid disorders.  Educational material on preventative health measures for her age group provided as part of after visit summary.  Recent notes from patient's cardiology visit reviewed.  An After Visit Summary was printed and given to the patient.  Follow-up: Return in about 6 months (around 08/03/2019) for chronic issues and as needed.   Patient with annual well exam/preventative health exam 18-39 and Level 3 visit  Ashantae Pangallo, MD

## 2019-02-03 LAB — COMPREHENSIVE METABOLIC PANEL WITH GFR
ALT: 20 IU/L (ref 0–32)
AST: 22 IU/L (ref 0–40)
Albumin/Globulin Ratio: 1.8 (ref 1.2–2.2)
Albumin: 4.4 g/dL (ref 3.9–5.0)
Alkaline Phosphatase: 27 IU/L — ABNORMAL LOW (ref 39–117)
BUN/Creatinine Ratio: 13 (ref 9–23)
BUN: 9 mg/dL (ref 6–20)
Bilirubin Total: 0.9 mg/dL (ref 0.0–1.2)
CO2: 19 mmol/L — ABNORMAL LOW (ref 20–29)
Calcium: 9.1 mg/dL (ref 8.7–10.2)
Chloride: 105 mmol/L (ref 96–106)
Creatinine, Ser: 0.69 mg/dL (ref 0.57–1.00)
GFR calc Af Amer: 140 mL/min/1.73
GFR calc non Af Amer: 121 mL/min/1.73
Globulin, Total: 2.5 g/dL (ref 1.5–4.5)
Glucose: 76 mg/dL (ref 65–99)
Potassium: 4.3 mmol/L (ref 3.5–5.2)
Sodium: 137 mmol/L (ref 134–144)
Total Protein: 6.9 g/dL (ref 6.0–8.5)

## 2019-02-03 LAB — CBC
Hematocrit: 38.9 % (ref 34.0–46.6)
Hemoglobin: 13.4 g/dL (ref 11.1–15.9)
MCH: 31.2 pg (ref 26.6–33.0)
MCHC: 34.4 g/dL (ref 31.5–35.7)
MCV: 91 fL (ref 79–97)
Platelets: 85 x10E3/uL — CL (ref 150–450)
RBC: 4.3 x10E6/uL (ref 3.77–5.28)
RDW: 11.8 % (ref 11.7–15.4)
WBC: 4.2 x10E3/uL (ref 3.4–10.8)

## 2019-02-03 LAB — LIPID PANEL
Chol/HDL Ratio: 2.8 ratio (ref 0.0–4.4)
Cholesterol, Total: 126 mg/dL (ref 100–199)
HDL: 45 mg/dL
LDL Chol Calc (NIH): 64 mg/dL (ref 0–99)
Triglycerides: 87 mg/dL (ref 0–149)
VLDL Cholesterol Cal: 17 mg/dL (ref 5–40)

## 2019-02-03 LAB — HIV ANTIBODY (ROUTINE TESTING W REFLEX): HIV Screen 4th Generation wRfx: NONREACTIVE

## 2019-02-04 ENCOUNTER — Other Ambulatory Visit (HOSPITAL_BASED_OUTPATIENT_CLINIC_OR_DEPARTMENT_OTHER): Payer: Medicaid Other | Admitting: Family Medicine

## 2019-02-04 DIAGNOSIS — D696 Thrombocytopenia, unspecified: Secondary | ICD-10-CM | POA: Diagnosis not present

## 2019-02-04 NOTE — Progress Notes (Signed)
Patient ID: Tammie Wade, female   DOB: May 26, 1995, 23 y.o.   MRN: [id number]   Patient with thrombocytopenia on prior labs but most recent lab with platelet count of 85. Will refer patient to hematology for further evaluation and treatment. Patient is also followed by [facility]iology due to history of VSD- ventricular septal defect and PDA (patent ductus arteriosus) status post repair and patient with bronchopulmonary dysplasia

## 2019-02-08 ENCOUNTER — Telehealth: Payer: Self-pay | Admitting: Hematology and Oncology

## 2019-02-08 NOTE — Telephone Encounter (Signed)
New patient referral sent for established patient. Message to VG. Per VG ok to schedule. Returned call to patient and left message to 11/16 f/u. Schedule mailed.   Referral cancelled.

## 2019-02-19 NOTE — Progress Notes (Signed)
Patient Care Team: Patient, No Pcp Per as PCP - General (General Practice)  DIAGNOSIS:    ICD-10-CM   1. Iron deficiency anemia due to chronic blood loss  D50.0   2. Congenital thrombocytopenia (HCC)  D69.42     CHIEF COMPLIANT: Severe iron deficiency anemia   INTERVAL HISTORY: Tammie Wade is a 23 y.o. with above-mentioned history of congenital thrombocytopenia and severe anemia previously requiring blood transfusion and IV iron. Labs on 02/02/19 showed: Hg 13.4, HCT 38.9, MCV 91, platelets 85. She presents to the clinic today for follow-up.   REVIEW OF SYSTEMS:   Constitutional: Denies fevers, chills or abnormal weight loss Eyes: Denies blurriness of vision Ears, nose, mouth, throat, and face: Denies mucositis or sore throat Respiratory: Denies cough, dyspnea or wheezes Cardiovascular: Denies palpitation, chest discomfort Gastrointestinal: Denies nausea, heartburn or change in bowel habits Skin: Denies abnormal skin rashes Lymphatics: Denies new lymphadenopathy or easy bruising Neurological: Denies numbness, tingling or new weaknesses Behavioral/Psych: Mood is stable, no new changes  Extremities: No lower extremity edema Breast: denies any pain or lumps or nodules in either breasts All other systems were reviewed with the patient and are negative.  I have reviewed the past medical history, past surgical history, social history and family history with the patient and they are unchanged from previous note.  ALLERGIES:  has No Known Allergies.  MEDICATIONS:  Current Outpatient Medications  Medication Sig Dispense Refill  . albuterol (PROVENTIL HFA;VENTOLIN HFA) 108 (90 Base) MCG/ACT inhaler Inhale 2 puffs into the lungs every 6 (six) hours as needed for wheezing or shortness of breath.    . cetirizine (ZYRTEC) 10 MG tablet Take 10 mg by mouth daily as needed for allergies.    . norgestrel-ethinyl estradiol (LOW-OGESTREL) 0.3-30 MG-MCG tablet Take 1 tablet by mouth  daily. Skipping placebo pills.  She is taking continuous active OCPs. 84 tablet 4  . umeclidinium-vilanterol (ANORO ELLIPTA) 62.5-25 MCG/INH AEPB Inhale 1 puff into the lungs daily. 1 each 3   No current facility-administered medications for this visit.     PHYSICAL EXAMINATION: ECOG PERFORMANCE STATUS: 1 - Symptomatic but completely ambulatory  Vitals:   02/20/19 1132  BP: (!) 121/92  Pulse: 76  Resp: 20  Temp: 98.7 F (37.1 C)  SpO2: 99%   Filed Weights   02/20/19 1132  Weight: 141 lb 9.6 oz (64.2 kg)    GENERAL: alert, no distress and comfortable SKIN: skin color, texture, turgor are normal, no rashes or significant lesions EYES: normal, Conjunctiva are pink and non-injected, sclera clear OROPHARYNX: no exudate, no erythema and lips, buccal mucosa, and tongue normal  NECK: supple, thyroid normal size, non-tender, without nodularity LYMPH: no palpable lymphadenopathy in the cervical, axillary or inguinal LUNGS: clear to auscultation and percussion with normal breathing effort HEART: regular rate & rhythm and no murmurs and no lower extremity edema ABDOMEN: abdomen soft, non-tender and normal bowel sounds MUSCULOSKELETAL: no cyanosis of digits and no clubbing  NEURO: alert & oriented x 3 with fluent speech, no focal motor/sensory deficits EXTREMITIES: No lower extremity edema  LABORATORY DATA:  I have reviewed the data as listed CMP Latest Ref Rng & Units 02/02/2019 05/20/2018 05/19/2018  Glucose 65 - 99 mg/dL 76 93 70  BUN 6 - 20 mg/dL _0 Creatinine 0.57 - 1.00 mg/dL 0.69 0.60 0.62  Sodium 134 - 144 mmol/L 137 135 137  Potassium 3.5 - 5.2 mmol/L 4.3 4.0 3.8  Chloride 96 - 106 mmol/L 105 106  108  CO2 20 - 29 mmol/L 19(L) 19(L) 22  Calcium 8.7 - 10.2 mg/dL 9.1 8.4(L) 9.0  Total Protein 6.0 - 8.5 g/dL 6.9 6.4(L) 7.7  Total Bilirubin 0.0 - 1.2 mg/dL 0.9 1.2 1.5(H)  Alkaline Phos 39 - 117 IU/L 27(L) 20(L) 24(L)  AST 0 - 40 IU/L _0 ALT 0 - 32 IU/L _1 Lab Results  Component Value Date   WBC 4.2 02/02/2019   HGB 13.4 02/02/2019   HCT 38.9 02/02/2019   MCV 91 02/02/2019   PLT 85 (LL) 02/02/2019   NEUTROABS 3.5 09/22/2018    ASSESSMENT & PLAN:  Iron deficiency anemia Hospitalization 05/19/2018-05/20/2018 for menorrhagia with a hemoglobin of 6.3, received blood transfusions and 1 dose of IV iron infusion Lab review: 05/19/2018: Ferritin 4, hemoglobin 6.9, MCV 72, platelets 90 05/30/2018: Hemoglobin 8.1, MCV 78, platelets 129  Prior treatment: IV iron therapy March 2020 GYN follow-up regarding the menorrhagia.   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 She tells me that her platelet counts ranged from 60-90,000. Her problems with bleeding were severe during menarche but have subsequently improved after being treated with birth control pills. She does not have any current problems with bruising or bleeding.  Prior workup: An extensive workup by Dr. Melvenia Beam (in [place]) in the past showed no qualitative platelet defect (normal platelet aggregation testing) and no evidence of ITP. Bone marrow testing in the past has shown normal megakaryocytes, and there are no other findings on her CBC today suggest new marrow dysplasias.   Differential diagnosisfor congenital thrombocytopenia as is extremely wide which include amegakaryocytic thrombocytopenia especially because of the fact that she has hearing impairment. Other X-linked thrombocytopenias are also possible as well as MYH-9 -related diseases.  I discussed with the patient of the current thrombocytopenia platelets of 85 is within the range for the patient.  Previous years she has had platelets that were as low as 79.  She did tend to spontaneously improve and also get worse periodically.  There is no active signs of bleeding or bruising.  We can continue to watch and monitor this. I left  a message for the patient's mother regarding the plan.  I will see her back in 6 months with labs and follow-up.    No orders of the defined types were placed in this encounter.  The patient has a good understanding of the overall plan. she agrees with it. she will call with any problems that may develop before the next visit here.  Nicholas Lose, MD 02/20/2019  Julious Oka Dorshimer, am acting as scribe for Dr. Nicholas Lose.  I have reviewed the above documentation for accuracy and completeness, and I agree with the above.

## 2019-02-20 ENCOUNTER — Telehealth: Payer: Self-pay | Admitting: Hematology and Oncology

## 2019-02-20 ENCOUNTER — Other Ambulatory Visit: Payer: Self-pay

## 2019-02-20 ENCOUNTER — Inpatient Hospital Stay: Payer: Medicaid Other | Attending: Hematology and Oncology | Admitting: Hematology and Oncology

## 2019-02-20 DIAGNOSIS — Z79899 Other long term (current) drug therapy: Secondary | ICD-10-CM | POA: Diagnosis not present

## 2019-02-20 DIAGNOSIS — D500 Iron deficiency anemia secondary to blood loss (chronic): Secondary | ICD-10-CM

## 2019-02-20 DIAGNOSIS — D6942 Congenital and hereditary thrombocytopenia purpura: Secondary | ICD-10-CM | POA: Diagnosis not present

## 2019-02-20 DIAGNOSIS — N920 Excessive and frequent menstruation with regular cycle: Secondary | ICD-10-CM | POA: Diagnosis not present

## 2019-02-20 NOTE — Assessment & Plan Note (Signed)
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 She tells me that her platelet counts ranged from 60-90,000. Her problems with bleeding were severe during menarche but have subsequently improved after being treated with birth control pills. She does not have any current problems with bruising or bleeding.  Prior workup: An extensive workup by Dr. Melvenia Beam (in [place]) in the past showed no qualitative platelet defect (normal platelet aggregation testing) and no evidence of ITP. Bone marrow testing in the past has shown normal megakaryocytes, and there are no other findings on her CBC today suggest new marrow dysplasias.   Differential diagnosisfor congenital thrombocytopenia as is extremely wide which include amegakaryocytic thrombocytopenia especially because of the fact that she has hearing impairment. Other X-linked thrombocytopenias are also possible as well as MYH-9 -related diseases.

## 2019-02-20 NOTE — Telephone Encounter (Signed)
I left a message regarding schedule  

## 2019-02-20 NOTE — Assessment & Plan Note (Signed)
Hospitalization 05/19/2018-05/20/2018 for menorrhagia with a hemoglobin of 6.3, received blood transfusions and 1 dose of IV iron infusion Lab review: 05/19/2018: Ferritin 4, hemoglobin 6.9, MCV 72, platelets 90 05/30/2018: Hemoglobin 8.1, MCV 78, platelets 129  Prior treatment: IV iron therapy March 2020 GYN follow-up regarding the menorrhagia.

## 2019-03-06 ENCOUNTER — Telehealth: Payer: Self-pay | Admitting: *Deleted

## 2019-03-06 ENCOUNTER — Ambulatory Visit: Payer: Self-pay | Admitting: Obstetrics & Gynecology

## 2019-03-06 DIAGNOSIS — N920 Excessive and frequent menstruation with regular cycle: Secondary | ICD-10-CM

## 2019-03-06 NOTE — Telephone Encounter (Signed)
Spoke with pt after speaking to Dr Sabra Heck. Pt rescheduled for IUD placement with u/s guidance for [facility] per Dr Sabra Heck for 03/09/2019 at 2pm. Pt agreeable.   Will route to Dr Sabra Heck for review and will close encounter.  Cc: Weston Brass for precert IUD, orders placed.

## 2019-03-06 NOTE — Telephone Encounter (Signed)
Spoke with pt. Pt states having abnormal bleeding for last 3-4 days and trickling while on the toilet. Pt changed panty liner x 2 last 3-4 days and changed pad from last night that wasn't saturated and has same pad on from change at 10am this morning. Pt denies sharp abd pain, heavy bleeding, clots, lightheadedness, weakness and SOB, but has some cramping and taking Motrin for it. Denies UTI sx.    Pt has hx of CHF, thrombocytopenia, anemia and pulmonary HTN. Was recently seen at [facility] for cardiology on 02/10/19 (See notes in Care everywhere) Pts mother states that [place] and Pulmonologist recommends changing BCM from OCPs to IUD due to bleeding D/O, needs recommendations from Dr Sabra Heck.   Pt scheduled to be seen on 11/30 at 4:30pm today with Dr Sabra Heck d/t sx and hx.   Will route to Dr Sabra Heck for any additional recommendations and advice.

## 2019-03-06 NOTE — Telephone Encounter (Signed)
Patient is having abnormal cycles.

## 2019-03-07 NOTE — Telephone Encounter (Signed)
Called pt to reschedule time of appt from 2pm to 2:30pm 52/77/82 due to conflict in schedule. Will send mychart message too per pts request.   Will close encounter.

## 2019-03-07 NOTE — Addendum Note (Signed)
Addended by: Georgia Lopes on: 03/07/2019 09:01 AM   Modules accepted: Orders

## 2019-03-07 NOTE — Telephone Encounter (Signed)
Orders placed for U/S

## 2019-03-08 ENCOUNTER — Telehealth: Payer: Self-pay | Admitting: Obstetrics & Gynecology

## 2019-03-08 NOTE — Telephone Encounter (Signed)
Spoke with pt and mother on phone call. Pt aware to take OCPs before IUD placement tomorrow. Pt verbalized understanding.   Routing to provider for final review. Patient is agreeable to disposition. Will close encounter.

## 2019-03-08 NOTE — Telephone Encounter (Signed)
Patient is calling to see if she can take her birth control before her procedure appointment tomorrow.

## 2019-03-09 ENCOUNTER — Other Ambulatory Visit: Payer: Self-pay

## 2019-03-09 ENCOUNTER — Ambulatory Visit: Payer: Self-pay | Admitting: Obstetrics & Gynecology

## 2019-03-09 ENCOUNTER — Ambulatory Visit (INDEPENDENT_AMBULATORY_CARE_PROVIDER_SITE_OTHER): Payer: Medicaid Other

## 2019-03-09 ENCOUNTER — Ambulatory Visit: Payer: Medicaid Other | Admitting: Obstetrics & Gynecology

## 2019-03-09 ENCOUNTER — Encounter: Payer: Self-pay | Admitting: Obstetrics & Gynecology

## 2019-03-09 VITALS — BP 110/60 | HR 80 | Temp 97.7°F | Resp 14 | Ht 66.0 in | Wt 137.0 lb

## 2019-03-09 DIAGNOSIS — N857 Hematometra: Secondary | ICD-10-CM | POA: Diagnosis not present

## 2019-03-09 DIAGNOSIS — N920 Excessive and frequent menstruation with regular cycle: Secondary | ICD-10-CM

## 2019-03-09 NOTE — Progress Notes (Signed)
23 y.o. [place] American female here for pelvic ultrasound due to irregular bleeding that started again this month.  Pt had this about a year ago and had a hematometra noted.  Hysteroscopy with D&C was then performed.  Pathology was negative.  She continued on her OCPs and cycles were regular until this last month when she had several days of mid cycle spotting/bleeding.   Pt has recently been seen at [facility] and has been diagnosed with pulmonary hypertension.  Her mother advised [place] she has also been diagnosed with congestive heart failure but I did not find this in her records reviewed through [place].  Pt states she was advised to switch away from her combination OCPs but I cannot find this in any of her [facility] notes as well.  I do feel IUD is a possible good option for her and we planned to consider ultrasound guided IUD placement in this pt today if possible.  Patient's last menstrual period was 02/19/2019.  Contraception: OCPs  Findings:  UTERUS: 7.6 x 4.5 x 3.5cm EMS: 7.8cm with appearance of hematometra ADNEXA: Left ovary: 2.0 x 1.2 x 0.8cm       Right ovary: not seen on exam today due to bowel gas/overshadowing CUL DE SAC: no free fluid  Discussion:  Pt's mother was called when we reviewed the presence again of hematometra.  Discussed repeat hysteroscopy with D&C and consider placing IUD in the OR when pt is under anesthesia.  Pt is comfortable with plan.  Will proceed with scheduling but may be in beginning of 2021.  Assessment:  Irregular bleeding Hematometra Pulmonary hypertension  Plan:  Hysteroscopy with D&C and IUD placement with intraop ultrasound will be planned May need cardiac clearance so will need to communicate with [facility] cardiology  ~25 minutes spent with patient >50% of time was in face to face discussion of above.

## 2019-03-22 ENCOUNTER — Telehealth: Payer: Self-pay | Admitting: Pulmonary Disease

## 2019-03-22 NOTE — Telephone Encounter (Signed)
Called by [place] who states that she needs a prescription for Anoro Ellipta 62.5 - 25 mcg/inhaler called into [facility] [phone]). Called [facility] only to find out that they already have a prescription for [employer] on file, however, it requires pre- authorization by insurance. I can't arrange for pre-authorization from [place] at 7:30 PM. The PCCM office will need to follow up on pre-authorization in the morning during business hours.

## 2019-03-23 ENCOUNTER — Other Ambulatory Visit: Payer: Self-pay | Admitting: General Surgery

## 2019-03-23 DIAGNOSIS — J449 Chronic obstructive pulmonary disease, unspecified: Secondary | ICD-10-CM

## 2019-03-23 DIAGNOSIS — I2723 Pulmonary hypertension due to lung diseases and hypoxia: Secondary | ICD-10-CM

## 2019-03-23 MED ORDER — ANORO ELLIPTA 62.5-25 MCG/INH IN AEPB: 1.0000 | INHALATION_SPRAY | Freq: Every day | RESPIRATORY_TRACT | 1 refills | Status: DC

## 2019-03-23 NOTE — Telephone Encounter (Signed)
Pt has Medicaid, so PA needs to be obtained through [employer]. Attempted to call [employer], was on hold for 15 minutes without ever reaching a rep.  Will call back.

## 2019-03-24 ENCOUNTER — Telehealth: Payer: Self-pay | Admitting: Pulmonary Disease

## 2019-03-24 MED ORDER — ANORO ELLIPTA 62.5-25 MCG/INH IN AEPB: 1.0000 | INHALATION_SPRAY | Freq: Every day | RESPIRATORY_TRACT | 0 refills | Status: AC

## 2019-03-24 NOTE — Telephone Encounter (Signed)
Called and spoke with pt's mother Tammie Wade letting her know that we would do the PA for pt's anoro. Asked her if pt had enough Anoro to hold her over until we got the PA taken care of and she said pt has been out of it x3 days. Stated to her that I would place samples up front for her to come pick up for pt and she verbalized understanding. Samples placed up front for pt. Pt is due for an appt with RA. Note placed on the sample bag that pt needs to make an appt before leaving. Nothing further needed.

## 2019-03-27 NOTE — Telephone Encounter (Signed)
Attempted to call [employer] but they close at 5:00.  Will call back on 12/22.

## 2019-04-13 ENCOUNTER — Ambulatory Visit (INDEPENDENT_AMBULATORY_CARE_PROVIDER_SITE_OTHER): Payer: Medicaid Other | Admitting: Pulmonary Disease

## 2019-04-13 ENCOUNTER — Telehealth: Payer: Self-pay

## 2019-04-13 ENCOUNTER — Other Ambulatory Visit: Payer: Self-pay

## 2019-04-13 ENCOUNTER — Encounter: Payer: Self-pay | Admitting: Pulmonary Disease

## 2019-04-13 DIAGNOSIS — I2723 Pulmonary hypertension due to lung diseases and hypoxia: Secondary | ICD-10-CM | POA: Diagnosis not present

## 2019-04-13 DIAGNOSIS — J449 Chronic obstructive pulmonary disease, unspecified: Secondary | ICD-10-CM

## 2019-04-13 NOTE — Assessment & Plan Note (Signed)
Dyspnea seems to have been a combination of anemia which has improved and pulmonary hypertension.  This was demonstrated on right heart cath and she will follow up with [facility] cardiology regarding starting of medications for pulmonary hypertension-likely related to surgically corrected congenital heart disease

## 2019-04-13 NOTE — Progress Notes (Signed)
Subjective:    Patient ID: Tammie Wade, female    DOB: 08/04/1995, 24 y.o.   MRN: [id number]  HPI   24 yo never smoker with history of bronchopulmonary dysplasia & severe airways obstruction for FU  of pulmonary hypertension.  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  Based on her prior evaluation, she has severe airway obstruction and decreased DLCO and CT imaging showed severe bullous emphysema.     She had severe anemia due to menorrhagia, this is now improved status post D&C and she follows with gynecology, last hemoglobin was 13 in 01/2019 She has chronic thrombocytopenia  Her last visit with me 06/2018 I changed her to Anoro and she has tolerated this well She needs refills  I reviewed cardiac cath results from [place] echo it showed severe pulmonary hypertension with RVSP 61 mm She reports NYHA class II dyspnea, symptoms, no interim flareups or cough or sputum production      Significant tests/ events reviewed  Cath 02/2019 Moderately elevated right heart pressures with normal pulmonary capillary wedge pressure, hemodynamics consistent with pulmonary arterial hypertension secondary to repaired congenital heart disease. No significant step-up in oxygenation seen on  saturation run. With 40 ppm of nitric oxide the mean PA pressure dropped from 34 to 27 mmHg and the pulmonary capillary wedge did not change. The PVR dropped from 3.9 to 2.6 [place] units. >> medical treatment  04/2018  CT angiogram which was negative for pulmonary embolism and showed stable emphysema and ILD with enlarged pulmonary arteries at 4.9 cm suggestive of pulmonary hypretension.  She was referred to cardiology for an abnormal EKG showing T wave inversions in anterior leads. Echo showed mild reduction in LVEF to 45 to 50% with moderate MR and RVSP of 59 mm with dilated RV.  No shunting was noted. 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  PFT 07/23/15: FVC 2.67 L (79%) FEV1 1.38 L (47%)ratio 50 no bronchodilator response TLC 5.42 L (105%)  DLCO uncorrected 38% (hemoglobin 13.4) 04/20/14: FVC 2.50 L (77%) FEV1 1.37 L (47%) ratio.55  no bronchodilator response TLC 5.03 L (98%)  DLCO uncorrected 56%   CT CHEST W/O 07/23/15 :Bullae noted throughout both lungs. Abnormal dysplastic formation/changes in both lungs. Volume loss in both lower lobes  07/12/15 Alpha-1 antitrypsin: MM (247) Alpha-1 Antitrypsin (05/01/14): PiM (>34 microM)  Review of Systems neg for any significant sore throat, dysphagia, itching, sneezing, nasal congestion or excess/ purulent secretions, fever, chills, sweats, unintended wt loss, pleuritic or exertional cp, hempoptysis, orthopnea pnd or change in chronic leg swelling. Also denies presyncope, palpitations, heartburn, abdominal pain, nausea, vomiting, diarrhea or change in bowel or urinary habits, dysuria,hematuria, rash, arthralgias, visual complaints, headache, numbness weakness or ataxia.     Objective:   Physical Exam   Gen. Pleasant, well-nourished, in no distress ENT - no thrush, no pallor/icterus,no post nasal drip Neck: No JVD, no thyromegaly, no carotid bruits Lungs: no use of accessory muscles, no dullness to percussion, clear without rales or rhonchi  Cardiovascular: Rhythm regular, normal, loud S2 no murmurs or gallops, no peripheral edema Musculoskeletal: No deformities, no cyanosis or clubbing         Assessment & Plan:

## 2019-04-13 NOTE — Telephone Encounter (Signed)
PA requested by [place] for [employer]. Pt has Medicaid, so I called [employer] to initiate PA. PA has been approved from 04/13/19-04/07/20. PA#: [id number]. Pharmacy aware of approval.  Nothing further needed at this time- will close encounter.

## 2019-04-13 NOTE — Patient Instructions (Signed)
Stay on Anoro 

## 2019-04-13 NOTE — Assessment & Plan Note (Signed)
Continue Anoro-this seems to have helped

## 2019-04-21 ENCOUNTER — Ambulatory Visit: Payer: Medicaid Other | Admitting: Pulmonary Disease

## 2019-05-12 ENCOUNTER — Other Ambulatory Visit: Payer: Self-pay | Admitting: *Deleted

## 2019-05-13 ENCOUNTER — Ambulatory Visit (INDEPENDENT_AMBULATORY_CARE_PROVIDER_SITE_OTHER): Payer: 59

## 2019-05-13 ENCOUNTER — Ambulatory Visit (HOSPITAL_COMMUNITY)
Admission: EM | Admit: 2019-05-13 | Discharge: 2019-05-13 | Disposition: A | Discharge status: Home / Self Care | Payer: 59 | Attending: Emergency Medicine | Admitting: Emergency Medicine

## 2019-05-13 ENCOUNTER — Encounter (HOSPITAL_COMMUNITY): Payer: Self-pay | Admitting: *Deleted

## 2019-05-13 ENCOUNTER — Other Ambulatory Visit: Payer: Self-pay

## 2019-05-13 DIAGNOSIS — R9431 Abnormal electrocardiogram [ECG] [EKG]: Secondary | ICD-10-CM | POA: Insufficient documentation

## 2019-05-13 DIAGNOSIS — Z8249 Family history of ischemic heart disease and other diseases of the circulatory system: Secondary | ICD-10-CM | POA: Diagnosis not present

## 2019-05-13 DIAGNOSIS — Z803 Family history of malignant neoplasm of breast: Secondary | ICD-10-CM | POA: Insufficient documentation

## 2019-05-13 DIAGNOSIS — Z20822 Contact with and (suspected) exposure to covid-19: Secondary | ICD-10-CM | POA: Diagnosis not present

## 2019-05-13 DIAGNOSIS — R7303 Prediabetes: Secondary | ICD-10-CM | POA: Insufficient documentation

## 2019-05-13 DIAGNOSIS — Z79899 Other long term (current) drug therapy: Secondary | ICD-10-CM | POA: Insufficient documentation

## 2019-05-13 DIAGNOSIS — Z836 Family history of other diseases of the respiratory system: Secondary | ICD-10-CM | POA: Diagnosis not present

## 2019-05-13 DIAGNOSIS — R0602 Shortness of breath: Secondary | ICD-10-CM | POA: Diagnosis present

## 2019-05-13 DIAGNOSIS — Z8774 Personal history of (corrected) congenital malformations of heart and circulatory system: Secondary | ICD-10-CM | POA: Diagnosis not present

## 2019-05-13 DIAGNOSIS — Z801 Family history of malignant neoplasm of trachea, bronchus and lung: Secondary | ICD-10-CM | POA: Insufficient documentation

## 2019-05-13 DIAGNOSIS — Z793 Long term (current) use of hormonal contraceptives: Secondary | ICD-10-CM | POA: Insufficient documentation

## 2019-05-13 DIAGNOSIS — J439 Emphysema, unspecified: Secondary | ICD-10-CM | POA: Diagnosis not present

## 2019-05-13 DIAGNOSIS — D6942 Congenital and hereditary thrombocytopenia purpura: Secondary | ICD-10-CM | POA: Insufficient documentation

## 2019-05-13 DIAGNOSIS — Z833 Family history of diabetes mellitus: Secondary | ICD-10-CM | POA: Diagnosis not present

## 2019-05-13 DIAGNOSIS — I272 Pulmonary hypertension, unspecified: Secondary | ICD-10-CM | POA: Diagnosis not present

## 2019-05-13 HISTORY — DX: Congenital aneurysm of aorta: Q25.43

## 2019-05-13 HISTORY — DX: Pulmonary hypertension, unspecified: I27.20

## 2019-05-13 HISTORY — DX: Other congenital malformations of aorta: Q25.49

## 2019-05-13 LAB — POC SARS CORONAVIRUS 2 AG: SARS Coronavirus 2 Ag: NEGATIVE

## 2019-05-13 LAB — POC SARS CORONAVIRUS 2 AG -  ED: SARS Coronavirus 2 Ag: NEGATIVE

## 2019-05-13 NOTE — ED Provider Notes (Addendum)
[place]    CSN: [id number] Arrival date & time: 05/13/19  1206      History   Chief Complaint Chief Complaint  Patient presents with  . Shortness of Breath    HPI Tammie Wade is a 24 y.o. female history of congenital heart defect including ASD, VSD, with more recent diagnosis of pulmonary hypertension presenting today for evaluation of worsening shortness of breath.  Mom has noticed that the patient has become more short of breath and more winded with daily activities that she typically does not have any breathing issues with.  She has also been using her albuterol inhaler more than normal.  Uses Anoro daily.  Has began to develop a cough today.  Denies any fevers chills or body aches at home.  Had a mild tickle in throat earlier, but resolved with drinking water.  Denies any known Covid exposure.  Denies sick contacts at home.  Patient denies chest pain.  Patient mother also expressed concern over mass to right anterior chest.  States that this was present 1 day and is unsure of what the cause of this is.  Recently seen by pulmonology at [facility] and diagnosed with pulmonary hypertension.  Mom states that plans involve future heart surgery.  HPI  Past Medical History:  Diagnosis Date  . Abnormal uterine bleeding   . Allergy   . ASD (atrial septal defect)   . Asthma   . Blood transfusion without reported diagnosis    13 yrs at [facility] in [place] -4 units  . Bronchopulmonary dysplasia   . Chronic lung disease   . Congenital thrombocytopenia ([place])   . COPD (chronic obstructive pulmonary disease) ([place])   . Emphysema lung ([place])   . Emphysema of lung ([place])   . 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 History of seizures   . Learning disabilities   . Meconium aspiration   . Prediabetes   . Pulmonary hypertension ([place])   . S/P repair of PDA   . Sinus of Valsalva aneurysm   . Thrombocytopenia ([place])   . VSD (ventricular septal defect)      Patient Active Problem List   Diagnosis Date Noted  . Anemia 05/19/2018  . Iron deficiency anemia 05/19/2018  . Pulmonary hypertension due to COPD ([place]) 05/19/2018  . Weight loss 05/19/2018  . S/P PDA repair 01/02/2018  . Scoliosis 01/02/2018  . VSD (ventricular septal defect) 08/26/2017  . PDA (patent ductus arteriosus) 08/26/2017  . Family history of tuberous sclerosis 09/23/2016  . Menorrhagia with irregular cycle 11/20/2015  . 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 COPD, severe ([place]) 07/12/2015  . Emphysema lung ([employer]) 07/12/2015  . GERD (gastroesophageal reflux disease) 07/12/2015  . Congenital thrombocytopenia ([place]) 07/10/2015  . Asthma, mild intermittent 01/02/2004  . 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 Difficulty hearing 06/22/2003  . ASD (atrial septal defect), ostium secundum 01/06/1994    Past Surgical History:  Procedure Laterality Date  . CARDIAC SURGERY     open heart at 6 weeks old - bottom 2 chambers of heart were closed  . DILATATION & CURETTAGE/HYSTEROSCOPY WITH MYOSURE N/A 01/24/2018   Procedure: DILATATION & CURETTAGE/HYSTEROSCOPY , PAP SMEAR;  Surgeon: Megan Salon, MD;  Location: [facility];  Service: Gynecology;  Laterality: N/A;  Possible Myosure  . EYE SURGERY Bilateral    x 2 weak muscles  . PATENT DUCTUS ARTERIOUS REPAIR  newborn  . VSD REPAIR     newborn    OB History    Gravida  0   Para  0   Term  0   Preterm  0   AB  0   Living  0     SAB  0   TAB  0   Ectopic  0   Multiple  0   Live Births               Home Medications    Prior to Admission medications   Medication Sig Start Date End Date Taking? Authorizing Provider  albuterol (PROVENTIL HFA;VENTOLIN HFA) 108 (90 Base) MCG/ACT inhaler Inhale 2 puffs into the lungs every 6 (six) hours as needed for wheezing or shortness of breath.   Yes [provider]  Multiple Vitamin (MULTIVITAMIN) capsule Take 1 capsule by  mouth daily.   Yes [provider]  norgestrel-ethinyl estradiol (LOW-OGESTREL) 0.3-30 MG-MCG tablet Take 1 tablet by mouth daily. Skipping placebo pills.  She is taking continuous active OCPs. 05/27/18  Yes Megan Salon, MD  umeclidinium-vilanterol [facility] ELLIPTA) 62.5-25 MCG/INH AEPB Inhale 1 puff into the lungs daily. Appointment needed. Patient did not have follow up appointment. 03/23/19  Yes Rigoberto Noel, MD  cetirizine (ZYRTEC) 10 MG tablet Take 10 mg by mouth daily as needed for allergies.    [provider]  umeclidinium-vilanterol (ANORO ELLIPTA) 62.5-25 MCG/INH AEPB Inhale 1 puff into the lungs daily. 03/24/19   Rigoberto Noel, MD    Family History Family History  Problem Relation Age of Onset  . Osteopenia Mother   . Hypertension Mother   . Diabetes Mother   . Breast cancer Maternal Aunt   . Cancer Maternal Aunt   . Hyperlipidemia Maternal Aunt   . Diabetes Maternal Aunt   . Lung cancer Maternal Grandmother   . Hyperlipidemia Maternal Grandmother   . Diabetes Maternal Grandmother   . COPD Maternal Grandmother   . Emphysema Maternal Grandmother   . Sickle cell anemia Maternal Grandmother   . Hyperlipidemia Maternal Grandfather   . Diabetes Maternal Grandfather   . Hypertension Father   . Hyperlipidemia Father   . Renal Disease Father   . Sickle cell anemia Maternal Uncle   . Rheum arthritis Cousin     Social History Social History   Tobacco Use  . Smoking status: Never Smoker  . Smokeless tobacco: Never Used  Substance Use Topics  . Alcohol use: No    Alcohol/week: 0.0 standard drinks  . Drug use: No     Allergies   Patient has no known allergies.   Review of Systems Review of Systems  Constitutional: Positive for fatigue. Negative for activity change, appetite change, chills and fever.  HENT: Negative for congestion, ear pain, rhinorrhea, sinus pressure, sore throat and trouble swallowing.   Eyes: Negative for discharge and  redness.  Respiratory: Positive for cough and shortness of breath. Negative for chest tightness.   Cardiovascular: Negative for chest pain.  Gastrointestinal: Negative for abdominal pain, diarrhea, nausea and vomiting.  Musculoskeletal: Negative for myalgias.  Skin: Negative for rash.  Neurological: Negative for dizziness, light-headedness and headaches.     Physical Exam Triage Vital Signs ED Triage Vitals  Enc Vitals Group     BP 05/13/19 1230 136/71     Pulse Rate 05/13/19 1230 (!) 102     Resp 05/13/19 1230 (!) 30     Temp 05/13/19 1230 99.9 F (37.7 C)  Temp Source 05/13/19 1230 Oral     SpO2 05/13/19 1230 96 %     Weight --      Height --      Head Circumference --      Peak Flow --      Pain Score 05/13/19 1234 0     Pain Loc --      Pain Edu? --      Excl. in [place]? --    No data found.  Updated Vital Signs BP 136/71   Pulse 94   Temp 99.9 F (37.7 C) (Oral)   Resp (!) 24   LMP 04/22/2019 (Approximate) Comment: Has period every other month  SpO2 96%   Visual Acuity Right Eye Distance:   Left Eye Distance:   Bilateral Distance:    Right Eye Near:   Left Eye Near:    Bilateral Near:     Physical Exam Vitals and nursing note reviewed.  Constitutional:      General: She is not in acute distress.    Appearance: She is well-developed.  HENT:     Head: Normocephalic and atraumatic.     Ears:     Comments: Bilateral ears without tenderness to palpation of external auricle, tragus and mastoid, EAC's without erythema or swelling, TM's with good bony landmarks and cone of light. Non erythematous.     Mouth/Throat:     Comments: Oral mucosa pink and moist, no tonsillar enlargement or exudate. Posterior pharynx patent and nonerythematous, no uvula deviation or swelling. Normal phonation. Eyes:     Extraocular Movements: Extraocular movements intact.     Conjunctiva/sclera: Conjunctivae normal.     Pupils: Pupils are equal, round, and reactive to light.   Cardiovascular:     Rate and Rhythm: Normal rate and regular rhythm.     Heart sounds: No murmur.  Pulmonary:     Effort: Pulmonary effort is normal. No respiratory distress.     Breath sounds: Normal breath sounds.     Comments: Breathing comfortably at rest, CTABL, no wheezing, rales or other adventitious sounds auscultated  More prominent area to right chest, appears to be more prominent rib compared to left Abdominal:     Palpations: Abdomen is soft.     Tenderness: There is no abdominal tenderness.  Musculoskeletal:     Cervical back: Neck supple.     Comments: Bilateral lower legs symmetric  Skin:    General: Skin is warm and dry.  Neurological:     Mental Status: She is alert.      UC Treatments / Results  Labs (all labs ordered are listed, but only abnormal results are displayed) Labs Reviewed  NOVEL CORONAVIRUS, NAA (HOSP ORDER, SEND-OUT TO REF LAB; TAT 18-24 HRS)  POC SARS CORONAVIRUS 2 AG -  ED  POC SARS CORONAVIRUS 2 AG    EKG   Radiology DG Chest 2 View  Result Date: 05/13/2019 CLINICAL DATA:  Shortness of breath. EXAM: CHEST - 2 VIEW COMPARISON:  04/28/2018 FINDINGS: Lungs are hyperexpanded. The lungs are clear without focal pneumonia, edema, pneumothorax or pleural effusion. Nodular opacity in the left parahilar region is compatible with a vessel on-end. The cardiopericardial silhouette is within normal limits for size. Pulmonary arterial enlargement again noted. Probable tiny bilateral pleural effusions. IMPRESSION: Hyperexpansion with probable tiny bilateral pleural effusions. Electronically Signed   By: Misty Stanley M.D.   On: 05/13/2019 13:50    Procedures Procedures (including critical care time)  Medications Ordered in UC Medications -  No data to display  Initial Impression / Assessment and Plan / UC Course  I have reviewed the triage vital signs and the nursing notes.  Pertinent labs & imaging results that were available during my care of the  patient were reviewed by me and considered in my medical decision making (see chart for details).     Chest x-ray with tiny bilateral pleural effusions, vital signs stable, EKG normal sinus rhythm, does have inversions in V2, but this is consistent with prior EKGs, no acute signs of ischemia or infarction.  Rapid Covid negative, PCR pending.  At this time unclear cause of worsening shortness of breath.  Do not suspect PE at this time, no hypoxia, tachycardia improved.  Deferring any additional medicine therapy at this time, given patient significant heart/pulmonary history.  Continue with inhalers as prescribed.  Recommended follow-up in emergency room if symptoms worsening over the weekend, otherwise contact pulmonology Monday morning for further follow-up.   Final Clinical Impressions(s) / UC Diagnoses   Final diagnoses:  Shortness of breath     Discharge Instructions     Please contact [facility] pulmonary on Monday Morning Continue with inhalers as prescribed  Please follow up in emergency room if developing increased shortness of breath and chest pain     ED Prescriptions    None     PDMP not reviewed this encounter.   Joneen Caraway [place] C, PA-C 05/13/19 1727   Janith Lima, PA-C 05/13/19 1730

## 2019-05-13 NOTE — ED Triage Notes (Signed)
Pt states she has underlying SOB, but c/o increased SOB onset 2 days ago.  Has been using albuterol without much relief.  Mother states pt has tachypnea over past couple days.  Pt has told mother she has "had a little tickle in her throat".  Denies any cough, runny nose, congestion, or fevers at home.  Pt recently diagnosed with Pulmonary HTN; under care @ [facility].

## 2019-05-13 NOTE — Discharge Instructions (Signed)
Please contact [facility] pulmonary on Monday Morning Continue with inhalers as prescribed  Please follow up in emergency room if developing increased shortness of breath and chest pain

## 2019-05-14 LAB — NOVEL CORONAVIRUS, NAA (HOSP ORDER, SEND-OUT TO REF LAB; TAT 18-24 HRS): SARS-CoV-2, NAA: NOT DETECTED

## 2019-05-17 ENCOUNTER — Other Ambulatory Visit: Payer: Self-pay | Admitting: Obstetrics & Gynecology

## 2019-05-17 NOTE — Telephone Encounter (Signed)
Medication refill request: Norgestrel-Estradiol 0.3-30 #84, R2 Last AEX:  12-07-18 Next AEX: 02-13-20 Last MMG (if hormonal medication request): n/a Refill authorized: please authorize if appropriate

## 2019-05-18 ENCOUNTER — Other Ambulatory Visit: Payer: Self-pay | Admitting: *Deleted

## 2019-05-18 DIAGNOSIS — I712 Thoracic aortic aneurysm, without rupture, unspecified: Secondary | ICD-10-CM

## 2019-05-18 DIAGNOSIS — Q2549 Other congenital malformations of aorta: Secondary | ICD-10-CM

## 2019-05-26 ENCOUNTER — Other Ambulatory Visit: Payer: Self-pay | Admitting: Obstetrics & Gynecology

## 2019-05-26 ENCOUNTER — Other Ambulatory Visit (HOSPITAL_COMMUNITY): Payer: Self-pay | Admitting: Obstetrics & Gynecology

## 2019-05-26 ENCOUNTER — Telehealth: Payer: Self-pay

## 2019-05-26 DIAGNOSIS — N920 Excessive and frequent menstruation with regular cycle: Secondary | ICD-10-CM

## 2019-05-26 NOTE — Telephone Encounter (Signed)
Spoke with [employer]. Advised surgery is scheduled for 06/26/2019 at [facility] 0730 am. Pre op scheduled for 06/12/2019 at 11:30 am. COVID test scheduled for 06/22/2019 at 10:05 am at [facility]. Post op scheduled for 07/10/2019 at 27 am. Mother is agreeable to all appointment dates and times. Surgery instructions reviewed and mailed to verified home address on file.   Routing to provider and will close encounter.

## 2019-05-26 NOTE — Telephone Encounter (Signed)
Spoke with patient's mother [name], okay per ROI. Would like to proceed with hysteroscopy D&C/myosure IUD insertion with ultrasound guidance on 06/26/2019. Advised will request this date and return call once this is confirmed. Mother is agreeable.

## 2019-05-28 ENCOUNTER — Other Ambulatory Visit: Payer: Self-pay | Admitting: Pulmonary Disease

## 2019-05-28 DIAGNOSIS — J449 Chronic obstructive pulmonary disease, unspecified: Secondary | ICD-10-CM

## 2019-05-28 DIAGNOSIS — I2723 Pulmonary hypertension due to lung diseases and hypoxia: Secondary | ICD-10-CM

## 2019-05-29 ENCOUNTER — Other Ambulatory Visit: Payer: Self-pay | Admitting: Obstetrics & Gynecology

## 2019-05-29 DIAGNOSIS — Z30430 Encounter for insertion of intrauterine contraceptive device: Secondary | ICD-10-CM

## 2019-06-08 ENCOUNTER — Telehealth: Payer: Self-pay | Admitting: Obstetrics & Gynecology

## 2019-06-08 NOTE — Telephone Encounter (Signed)
Patient canceled her pre-op appointment 06/12/19. I spoke with patient's mother [name] and she said "[place] is having issues with her heart and lungs and does not want to have another surgery at this time".Okay to speak with mother per DPR.

## 2019-06-08 NOTE — Telephone Encounter (Signed)
Spoke with patient's mother [name], okay per ROI. Mother states that the patient is having issues with her heart and lungs. Has days where she is unable to breathe well and panics. Patient is very scared to proceed with surgery with other health complications and would like to cancel surgery for 06/26/2019. Advised will cancel surgery and speak with Dr.Miller to see if patient will need follow up in office since we are not proceeding with surgery.

## 2019-06-12 ENCOUNTER — Ambulatory Visit: Payer: Medicaid Other | Admitting: Obstetrics & Gynecology

## 2019-06-16 NOTE — Telephone Encounter (Signed)
Please cancel all post op appts.  She has November appt and should keep this.  Seeing cardiologist on 06/28/2019.  We can wait and see what happens with this visit and not schedule any additional follow up at this time.  Thanks.

## 2019-06-16 NOTE — Telephone Encounter (Signed)
All post op appointments cancelled. Spoke with patient's mother, okay per ROI. Advised of need to keep annual exam as schedule. Mother states they will be moving back to [facility] in 1 month. Will need to cancel annual exam for 02/2020. Mother is asking if Dr.Miller knows any [facility] in the [facility] area that the patient could see. Mother wants to ensure her daughter is getting the same quality care when they move. Advised will ask Dr.Miller regarding new provider, but that she may not be familiar with any providers in that area. Mother is agreeable.

## 2019-06-19 NOTE — Telephone Encounter (Signed)
Spoke with [place], okay per ROI. They will be moving to [place], [place] which is close to [place]. Advised will review with Dr.Miller and return call.

## 2019-06-19 NOTE — Telephone Encounter (Signed)
Could you see where in [facility] they are moving.  If to [place] area or [place] area, then I do have some suggestions.  Thanks.

## 2019-06-21 NOTE — Telephone Encounter (Signed)
Please let pt's mother know that Dr. Theda Sers has retired and moved from the area so I do not have any contacts there now.  I would suggest they see a provider at the [facility] as this would be good for coordination of care with pulmonology and cardiology.  Phone: [phone] for appts.

## 2019-06-21 NOTE — Telephone Encounter (Signed)
Spoke with patient's mother, okay per ROI. Advised of message as seen below from [place]. Mother verbalizes understanding.  Routing to provider and will close encounter.

## 2019-06-22 ENCOUNTER — Other Ambulatory Visit: Payer: Self-pay | Admitting: Emergency Medicine

## 2019-06-22 ENCOUNTER — Telehealth (HOSPITAL_COMMUNITY): Payer: Self-pay | Admitting: Emergency Medicine

## 2019-06-22 ENCOUNTER — Other Ambulatory Visit (HOSPITAL_COMMUNITY): Payer: Self-pay

## 2019-06-22 ENCOUNTER — Encounter (HOSPITAL_COMMUNITY): Payer: Self-pay

## 2019-06-22 DIAGNOSIS — I712 Thoracic aortic aneurysm, without rupture, unspecified: Secondary | ICD-10-CM

## 2019-06-22 NOTE — Telephone Encounter (Signed)
Left message on voicemail with name and callback number Marchia Bond RN Navigator Card[facility] [phone] Office [phone] Cell

## 2019-06-23 ENCOUNTER — Ambulatory Visit (HOSPITAL_COMMUNITY): Admission: RE | Admit: 2019-06-23 | Payer: 59 | Source: Ambulatory Visit

## 2019-06-26 ENCOUNTER — Other Ambulatory Visit (HOSPITAL_COMMUNITY): Payer: Medicaid Other

## 2019-06-26 ENCOUNTER — Ambulatory Visit (HOSPITAL_BASED_OUTPATIENT_CLINIC_OR_DEPARTMENT_OTHER): Admit: 2019-06-26 | Payer: 59 | Admitting: Obstetrics & Gynecology

## 2019-06-26 ENCOUNTER — Encounter (HOSPITAL_BASED_OUTPATIENT_CLINIC_OR_DEPARTMENT_OTHER): Payer: Self-pay

## 2019-06-26 SURGERY — DILATATION & CURETTAGE/HYSTEROSCOPY WITH MYOSURE
Anesthesia: Choice

## 2019-06-28 ENCOUNTER — Encounter: Payer: Medicaid Other | Admitting: Surgery

## 2019-07-04 ENCOUNTER — Other Ambulatory Visit: Payer: Self-pay | Admitting: Obstetrics & Gynecology

## 2019-07-04 ENCOUNTER — Telehealth: Payer: Self-pay | Admitting: Obstetrics & Gynecology

## 2019-07-04 DIAGNOSIS — Z01419 Encounter for gynecological examination (general) (routine) without abnormal findings: Secondary | ICD-10-CM

## 2019-07-04 NOTE — Telephone Encounter (Signed)
Patient's birth control prescription was denied.

## 2019-07-04 NOTE — Telephone Encounter (Signed)
Spoke to pt. Pt states cancelling IUD insertion with hysteroscopy with Dr Sabra Heck on 06/26/2019. Pt now requesting OCPs refill of [place]. Pt states having cycles every other month and takes them every day with skipping placebos. Pt states only having 2 pills left in current pack.   Routing to Dr Talbert Nan for review and refill request while Dr Sabra Heck out of office. # 84, 3 RF pended if approved. AEX 02/2020 with Dr Sabra Heck.   **Pt had cancelled cardiology appt on 06/28/19 ad rescheduled to 07/06/2019 as recommended by Dr Sabra Heck on phone encounter dated 06/08/19.  Pt has extensive cardio hx.**  Routing to Dr Talbert Nan as covering provider for Dr Sabra Heck.

## 2019-07-05 ENCOUNTER — Encounter (HOSPITAL_COMMUNITY): Payer: Self-pay

## 2019-07-05 MED ORDER — CRYSELLE-28 0.3-30 MG-MCG PO TABS: 1.0000 | ORAL_TABLET | Freq: Every day | ORAL | 1 refills | Status: AC

## 2019-07-05 NOTE — Telephone Encounter (Signed)
Call to patient, left message advising of refill, f/u with pharmacy for filling. Return call to office if any additional questions.   Spoke with Aloha Gell at [facility]. Was advised original order for Cryselle received on 05/19/19, Rx was filled on 05/19/19 for 56 tabs. Rx was "closed out"  by pharmacy, no additional refills on file.   New Rx sent for Cryselle #84 tab, 1 refill.   Routing to Dr. Talbert Nan.   Encounter closed.

## 2019-07-05 NOTE — Telephone Encounter (Signed)
Dr Sabra Heck sent in 3 packs of pills with 2 refills on 05/19/19. Please speak wit the pharmacy. She shouldn't need any refills.

## 2019-07-06 ENCOUNTER — Telehealth (HOSPITAL_COMMUNITY): Payer: Self-pay | Admitting: Radiology

## 2019-07-06 ENCOUNTER — Other Ambulatory Visit: Payer: Self-pay

## 2019-07-06 ENCOUNTER — Ambulatory Visit (HOSPITAL_COMMUNITY)
Admission: RE | Admit: 2019-07-06 | Discharge: 2019-07-06 | Disposition: A | Discharge status: Home / Self Care | Payer: Medicaid Other | Source: Ambulatory Visit | Attending: Surgery | Admitting: Surgery

## 2019-07-06 ENCOUNTER — Encounter (HOSPITAL_COMMUNITY): Payer: Self-pay

## 2019-07-06 DIAGNOSIS — I712 Thoracic aortic aneurysm, without rupture, unspecified: Secondary | ICD-10-CM

## 2019-07-06 DIAGNOSIS — Q2543 Congenital aneurysm of aorta: Secondary | ICD-10-CM

## 2019-07-06 DIAGNOSIS — Q2549 Other congenital malformations of aorta: Secondary | ICD-10-CM

## 2019-07-10 ENCOUNTER — Telehealth: Payer: Self-pay

## 2019-07-10 ENCOUNTER — Encounter (HOSPITAL_COMMUNITY): Payer: Self-pay

## 2019-07-10 ENCOUNTER — Ambulatory Visit: Payer: Medicaid Other | Admitting: Obstetrics & Gynecology

## 2019-07-10 ENCOUNTER — Telehealth (HOSPITAL_COMMUNITY): Payer: Self-pay | Admitting: Emergency Medicine

## 2019-07-10 ENCOUNTER — Other Ambulatory Visit (HOSPITAL_COMMUNITY): Payer: Self-pay | Admitting: Emergency Medicine

## 2019-07-10 NOTE — Telephone Encounter (Signed)
New message    2-5 mg tablets of Corlanor left at screening table for patient to take 2 hours prior to Ct test  Lot [id number] s/[id number]

## 2019-07-10 NOTE — Telephone Encounter (Signed)
Reaching out to patient to offer assistance regarding upcoming cardiac imaging study; pt verbalizes understanding of appt date/time, parking situation and where to check in, pre-test NPO status and medications ordered, and verified current allergies; name and call back number provided for further questions should they arise Tammie Bond RN Navigator Card[facility] [place] [facility] [phone] office [phone] cell   I explained to Tammie Wade that I requested her take ivabradine 2 hr prior to scan for HR control. There is a sample available at the [facility] office. She verbalized understanding  I also explained that I had talked with both the ordering MD and consulted a cardiology reader to ensure quality images and technique.   She appreciated the call Tammie Wade  after talking to Tammie Wade about technique : he said scan like a TAVR clavicles down and reconstruct best sys/dias w/ full field and valve 0-90% ; still give PO/IV beta blockers and NTG for best scan

## 2019-07-11 ENCOUNTER — Ambulatory Visit (HOSPITAL_COMMUNITY)
Admission: RE | Admit: 2019-07-11 | Discharge: 2019-07-11 | Disposition: A | Discharge status: Home / Self Care | Payer: 59 | Source: Ambulatory Visit | Attending: Surgery | Admitting: Surgery

## 2019-07-11 ENCOUNTER — Other Ambulatory Visit: Payer: Self-pay

## 2019-07-11 DIAGNOSIS — I712 Thoracic aortic aneurysm, without rupture: Secondary | ICD-10-CM | POA: Insufficient documentation

## 2019-07-11 DIAGNOSIS — Q2549 Other congenital malformations of aorta: Secondary | ICD-10-CM | POA: Diagnosis present

## 2019-07-11 MED ORDER — IOHEXOL 350 MG/ML SOLN
80.0000 mL | Freq: Once | INTRAVENOUS | Status: AC | PRN
  Administered 2019-07-11: 80 mL via INTRAVENOUS

## 2019-07-11 MED ORDER — NITROGLYCERIN 0.4 MG SL SUBL
SUBLINGUAL_TABLET | SUBLINGUAL | Status: AC
  Filled 2019-07-11: qty 2

## 2019-07-11 MED ORDER — NITROGLYCERIN 0.4 MG SL SUBL
0.8000 mg | SUBLINGUAL_TABLET | Freq: Once | SUBLINGUAL | Status: AC
  Administered 2019-07-11: 0.8 mg via SUBLINGUAL

## 2019-07-12 ENCOUNTER — Encounter: Payer: Self-pay | Admitting: Surgery

## 2019-07-12 ENCOUNTER — Ambulatory Visit: Payer: Medicaid Other | Admitting: Surgery

## 2019-07-12 VITALS — BP 143/82 | HR 99 | Temp 99.5°F | Resp 20 | Ht 66.0 in | Wt 147.0 lb

## 2019-07-12 DIAGNOSIS — Q2549 Other congenital malformations of aorta: Secondary | ICD-10-CM | POA: Diagnosis not present

## 2019-07-12 NOTE — Progress Notes (Signed)
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  She was initially sent to Dr. [name] in May 2019 for evaluation of an abnormal ECG noted by her primary care provider.  This showed anterior T wave inversions.  This is felt to be due to to her previous cardiac surgery.  She underwent an echocardiogram at that time which showed a mild reduction in left ventricular ejection fraction to 45 to 50% with grade 1 diastolic dysfunction.  There is mild aortic insufficiency.  There is moderate mitral regurgitation.  RV systolic function was mildly to moderately reduced with mild cavity dilation and pulmonary artery systolic pressure was measured at 59 mmHg.  There were no shunts at the atrial or ventricular level.  She presented to the emergency department in January 2020 with gradual onset of cough and worsening shortness of breath of several weeks duration.  She had a CT of the chest which was negative for pulmonary embolism.  It did show a chronically enlarged main pulmonary artery that was 4.9 cm.  The aortic root was felt to be abnormal but incompletely assessed.  There is moderate left ventricular dilation with left ventricular thinning and large right ventricle.  She underwent a repeat echocardiogram on 05/25/2018 which showed a left-ventricular ejection fraction of 40 to 45%.  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  There is mild eccentric aortic insufficiency.  There is  moderate mitral regurgitation with a thickened mitral valve suggesting previous repair. A gated cardiac CTA on 06/06/18 showed aneurysmal dilation of the noncoronary sinus of Valsalva with a measurement of 40 x 31 mm.  This was unchanged compared to a prior CT scan of the chest on 07/23/2015.  The aortic root diameter across the noncoronary sinus was 48 mm.  The sinotubular junction was 38 x 35 mm and the ascending aorta was 40 mm.  The descending thoracic aorta was 24 mm.  I felt that her sinus of Valsalva aneurysm should be followed given its size, unclear indications for surgical treatment, and increased risk of surgical treatment due to her biventricular systolic dysfunction, severe pulmonary hypertension, and severe lung disease.  She had a cardiac MRI at [facility] on 01/31/2019 which showed a noncoronary sinus of Valsalva aneurysm.  The largest diameter of the sinuses of Valsalva was 5.1 x 4.1 cm in cross-section and 5.1 x 3.3 x 3.3 cm in cusp-commissure dimensions.  She underwent right heart catheterization at [facility] on 02/10/2019 showing moderately elevated right heart pressures with pulmonary hypertension and normal pulmonary capillary wedge pressure.  Mean PA pressure dropped from 34 to 27 mmHg on 40 ppm of nitric oxide PVR dropped from 3.9-2.6 Wood units.  She saw Dr. Corine Shelter of the [facility] adult congenital heart disease clinic in January 2021 and continued follow-up in 6 months was recommended.  She is here today with her mother.  She reports that they are likely moving to [place] in the near future.  Is reports feeling well overall.  Her breathing has  been stable.  She denies any chest pain.  She has not been very active due to the COVID-19 pandemic.  Current Outpatient Medications  Medication Sig Dispense Refill  . albuterol (PROVENTIL HFA;VENTOLIN HFA) 108 (90 Base) MCG/ACT inhaler Inhale 2 puffs into the lungs every 6 (six) hours as needed for wheezing or shortness of breath.    Jearl Klinefelter  ELLIPTA 62.5-25 MCG/INH AEPB INHALE 1 PUFF INTO THE LUNGS DAILY. APPOINTMENT NEEDED. 60 each 11  . cetirizine (ZYRTEC) 10 MG tablet Take 10 mg by mouth daily as needed for allergies.    . Multiple Vitamin (MULTIVITAMIN) capsule Take 1 capsule by mouth daily.    . norgestrel-ethinyl estradiol (CRYSELLE-28) 0.3-30 MG-MCG tablet Take 1 tablet by mouth daily. 84 tablet 1  . umeclidinium-vilanterol (ANORO ELLIPTA) 62.5-25 MCG/INH AEPB Inhale 1 puff into the lungs daily. 1 each 0   No current facility-administered medications for this visit.     Physical Exam: BP (!) 143/82   Pulse 99   Temp 99.5 F (37.5 C) (Oral)   Resp 20   Ht _0  (1.676 m)   Wt 147 lb (66.7 kg)   SpO2 97% Comment: RA  BMI 23.73 kg/m  She looks well. Cardiac exam shows a regular rate and rhythm with physiologic splitting of S2.  There is no murmur. Lungs are clear. There is no peripheral edema.  Diagnostic Tests:  Cardiac MRI 01/31/2019:  1. Levocardia, atrial situs solitus, D- ventricular looping and normally related great arteries (S, D, S). 2. Normal systemic venous anatomy. 3. Normal pulmonary venous anatomy. 4. The left atrium is mildly enlarged, the right atrium is normal in size. The atrial septum appears intact on morphological images. The Qp/Qs by velocity flow mapping was 1, which precludes a significant shunt. Cannot rule out a patent foramen ovale or small secundum atrial septal defect. 5. Normal tricuspid valve morphology with no stenosis, there is mild tricuspid insufficiency. 6. The mitral valve leaflets are thickened, with no significant stenosis. There is bi-leaflet prolapse with associated mild-moderate mitral regurgitation.  7. The right ventricle is normal in size, and wall thickness. Global systolic function is mildly reduced, the RVEF is 44%. 8. The left ventricle is normal in size, and wall thickness. Global systolic function is mildly reduced, the LV ejection fraction 48%. Mild diffuse  hypokinesia with no regional wall motion abnormalities. 9. No residual ventricular septal defect seen. 10. Trileaflet aortic valve with no stenosis, there is mild aortic insufficiency. 11. Normal pulmonary valve with no stenosis, there is mild pulmonic insufficiency. 12. Dilated main pulmonary artery, with normal branch pulmonary arteries. Main pulmonary artery: 5.1 x 4.5 cm. Right pulmonary artery: 2.5 x 2.1 cm. Left pulmonary artery: 2.0 x 1.9 cm. 13. The aortic root is dilated to max. 5.1 cm (cusp-commissure), there is an aneurysmal dilatation of the non-coronary sinus of Valsalva. The tubular ascending aorta is dilated to max 4.1 cm. The aortic arch and descending aorta, which was seen below the level of the renal arteries is normal in diameter. The proximal descending aorta (isthmus) measures min. 1.6 cm. Left-sided aortic arch with normal branching. There is no evidence of residual patent ductus arteriosus.  Bi-orthogonal luminal aortic dimensions are listed below. Sinuses of Valsalva: 5.1 x 4.1 cm (largest dimension, cross-sectional dimension), 5.1 x 3.3 x 3.3 cm (cusp-commissure dimensions) Sino-tubular junction: 3.6 x 3.4 cm Mid-ascending aorta: 4.1 x 4.0 cm Proximal aortic arch (aorta at the origin of the innominate artery): 3.4 x 3.1 cm Mid-aortic arch (between  left common carotid and subclavian arteries): 2.4 x 2.4 cm Proximal descending thoracic aorta (begins at the isthmus, approximately 2 cm distal to the left subclavian artery): 1.8 x 1.6 cm Mid-descending aorta: 2.3 x 2.3 cm Aorta at diaphragm: 2.0 x 1.9 cm 14. Delayed enhancement imaging reveals no evidence of thrombus, infiltrative disease or prior infarction. 15. No pericardial or pleural effusion.     RHC (02/10/2019) Moderately elevated right heart pressures with normal pulmonary capillary wedge pressure, hemodynamics consistent with pulmonary arterial hypertension secondary to repaired congenital heart disease. No  significant step-up in oxygenation seen on  saturation run. With 40 ppm of nitric oxide the mean PA pressure dropped from 34 to 27 mmHg and the pulmonary capillary wedge did not change. The PVR dropped from 3.9 to 2.6 Wood units. State: Baseline RA: 6 mmHg (mean) RV: 54/ 5 mmHg PA: 54/ 28 34 mmHg (mean) PCW: 11 mmHg (mean) AV O2: 3.6 vol% Cardiac output: 5.4 L/min Cardiac index: 3.2 L/min-m2 PVR: 3.9 Wood units State: Nitric Oxide(40 ppm) PA: 42/ 20 27 mmHg (mean) PCW: 11 mmHg (mean) AV O2: 3.5 vol% Cardiac output: 6.1 L/min Cardiac index: 3.6 L/min-m2 PVR: 2.6 Wood units    ADDENDUM REPORT: 07/11/2019 12:36  EXAM: OVER-READ INTERPRETATION  CT CHEST  The following report is an over-read performed by radiologist Dr. Nor[facility] on 07/11/2019. This over-read does not include interpretation of cardiac or coronary anatomy or pathology. The coronary CTA interpretation by the cardiologist is attached.  COMPARISON:  06/03/2018  FINDINGS: Cardiovascular: Heart size is enlarged. Markedly dilated main pulmonary artery is identified measuring approximately 4.8 cm. No pericardial effusion.  Mediastinum/nodes: No mass or adenopathy identified.  Lungs/pleura: Bronchopulmonary dysplasia of both lower lobes and right middle lobe with resulted hyperexpansion and air trapping in the upper lobes and lingula again noted. Diffuse bronchial wall thickening. Scarring and volume loss within the left lower lobe noted. No airspace consolidation, atelectasis or pneumothorax. No suspicious pulmonary nodule or mass.  Upper abdomen: No acute abnormality  Musculoskeletal: No acute or suspicious osseous abnormality.  IMPRESSION: 1. Markedly dilated main pulmonary artery as before. No acute or significant noncardiac findings identified. 2.   Electronically Signed   By: Kerby Moors M.D.   On: 07/11/2019 12:36   Addended by Kerby Moors, MD on  07/11/2019 12:38 PM    Study Result  CLINICAL DATA:  Sinus Valsalva Aneurysm  EXAM: Cardiac CTA  MEDICATIONS: Sub lingual nitro.  85m  TECHNIQUE: The patient was scanned on a Siemens Force 1338slice scanner. Gantry rotation speed was 250 msecs. Collimation was .6 mm. A 100 kV prospective scan was triggered in the ascending thoracic aorta at 140 HU's Full mA was used between 35% and 75% of the R-R interval. Average HR during the scan was 65 bpm. The 3D data set was interpreted on a dedicated work station using MPR, MIP and VRT modes. A total of 80cc of contrast was used.  FINDINGS: [facility]cardiac: [facility] No significant findings on limited lung and soft tissue windows.  Calcium Score: 0 no calcium noted  Coronary Arteries: Right dominant LM originates between the non coronary cusp and left cusp Normal right dominant coronary arteries  PA: Dilated main PA 4.9 cm. RPA 2.4 cm LPA 22 mm. Calcification in area of previous PDA repair with no residual communication  Aorta: Mildly dilated root normal arch vessels Intact PDA repair  Aortic Root: 40 mm  STJ 29 mm  Aortic Arch  33 mm  Descending Thoracic Aorta 21 mm  Sinus: Tri leaflet AV with large sinus of Valsalva aneurysm involving the non coronary cusp  Left Sinus: 34 mm  Right Sinus 31 mm  Non Sinus: 48 mm measured parallel to commissurae with width of 43 mm Compared to CT done 06/03/18 with comparable measurement from center of valve coaptation  Appears to be slight increase in size. 40 -> 43 mm width and 31 -> 35 mm longitudinal  IMPRESSION: 1. Calcium score 0 normal right dominant coronary arteries with LM originating near non coronary commisure  2. Mild aortic root dilatation 4.0 cm with normal arch vessels and intact PDA repair  3. Severely dilated MPA 4.9 cm similar to previous study 06/03/18 no residual VSD noted  4. Severely dilated non coronary  sinus. Full SA measurement 48 mm with comparable center of leaflet coaptation measurements slightly large than 06/03/18 40 mm -> 43 mm by 31 -> 35 mm  Jenkins Rouge  Electronically Signed: By: Jenkins Rouge M.D. On: 07/11/2019 11:54       Impression:  Clinically she is doing well with no new symptoms.  I have personally reviewed her gated cardiac CTA images and compared them to her prior images from 06/03/2018.  The maximum diameter across the sinus of Valsalva portion of the aortic root is unchanged at 48 mm.  The dimensions of the noncoronary sinus have increased from 31 x 40 mm to 35 x 43 mm over the past year.  I reviewed these images with the patient and her mother and answered their questions.  The indications for surgical treatment of sinus of Valsalva aneurysm is not well defined and probably similar to the indications for other aortic root aneurysms.  This has increased by a few millimeters over the past year and I think it could be expected to continue to enlarge.  Given her young age I think it will require surgical treatment in the not too distant future.  Her operative risk would certainly be high due to her comorbid risk factors including biventricular dysfunction, moderate to severe pulmonary hypertension related to her congenital heart disease history and chronic lung disease from bronchopulmonary dysplasia and meconium aspiration syndrome.  She also had moderate mitral regurgitation on her echocardiogram 1 year ago which may be contributing to her pulmonary hypertension.  Due to her increased surgical risk I think it is reasonable to continue following this for now and she should have a follow-up gated cardiac CTA in 1 year and could have a follow-up echocardiogram in the interim.  She is in the process of moving to [place] and her mother will decide whether she will continue to return to [place] for follow-up, go to [facility] for follow-up, or establish new physicians in [place].  Plan:  We will tentatively plan to repeat her gated cardiac CTA in 1 year assuming that she decides to return to [facility] for continued follow-up.  She also has follow-up arranged with [facility] cardiology.  I spent 20 minutes performing this established patient evaluation and > 50% of this time was spent face to face counseling and coordinating the care of this patient's sinus of valsalva aneurysm.    Gaye Pollack, MD [facility] [phone]

## 2019-07-25 ENCOUNTER — Other Ambulatory Visit: Payer: Self-pay

## 2019-07-25 ENCOUNTER — Emergency Department (HOSPITAL_COMMUNITY): Payer: 59

## 2019-07-25 ENCOUNTER — Emergency Department (HOSPITAL_COMMUNITY)
Admission: EM | Admit: 2019-07-25 | Discharge: 2019-07-25 | Disposition: A | Discharge status: Home / Self Care | Payer: 59 | Attending: Emergency Medicine | Admitting: Emergency Medicine

## 2019-07-25 ENCOUNTER — Encounter (HOSPITAL_COMMUNITY): Payer: Self-pay | Admitting: Emergency Medicine

## 2019-07-25 DIAGNOSIS — Z5321 Procedure and treatment not carried out due to patient leaving prior to being seen by health care provider: Secondary | ICD-10-CM | POA: Diagnosis not present

## 2019-07-25 DIAGNOSIS — R079 Chest pain, unspecified: Secondary | ICD-10-CM | POA: Insufficient documentation

## 2019-07-25 LAB — I-STAT BETA HCG BLOOD, ED (MC, WL, AP ONLY): I-stat hCG, quantitative: <5 m[IU]/mL (ref <5)

## 2019-07-25 LAB — BASIC METABOLIC PANEL
Anion gap: 10 (ref 5–15)
BUN: 13 mg/dL (ref 6–20)
CO2: 22 mmol/L (ref 22–32)
Calcium: 9.4 mg/dL (ref 8.9–10.3)
Chloride: 103 mmol/L (ref 98–111)
Creatinine, Ser: 0.76 mg/dL (ref 0.44–1.00)
GFR calc Af Amer: >60 mL/min (ref >60)
GFR calc non Af Amer: >60 mL/min (ref >60)
Glucose, Bld: 114 mg/dL — ABNORMAL HIGH (ref 70–99)
Potassium: 4.5 mmol/L (ref 3.5–5.1)
Sodium: 135 mmol/L (ref 135–145)

## 2019-07-25 LAB — CBC
HCT: 45.7 % (ref 36.0–46.0)
Hemoglobin: 14.8 g/dL (ref 12.0–15.0)
MCH: 30.8 pg (ref 26.0–34.0)
MCHC: 32.4 g/dL (ref 30.0–36.0)
MCV: 95.2 fL (ref 80.0–100.0)
Platelets: 122 10*3/uL — ABNORMAL LOW (ref 150–400)
RBC: 4.8 MIL/uL (ref 3.87–5.11)
RDW: 12.8 % (ref 11.5–15.5)
WBC: 17.6 10*3/uL — ABNORMAL HIGH (ref 4.0–10.5)
nRBC: 0 % (ref 0.0–0.2)

## 2019-07-25 LAB — TROPONIN I (HIGH SENSITIVITY): Troponin I (High Sensitivity): 9 ng/L (ref <18)

## 2019-07-25 MED ORDER — SODIUM CHLORIDE 0.9% FLUSH: 3.0000 mL | Freq: Once | INTRAVENOUS | Status: DC

## 2019-07-25 NOTE — ED Notes (Signed)
Patient left ED.
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
## 2019-08-20 NOTE — Progress Notes (Signed)
Error

## 2019-08-21 ENCOUNTER — Other Ambulatory Visit: Payer: Self-pay

## 2019-08-21 ENCOUNTER — Inpatient Hospital Stay: Payer: 59 | Attending: Hematology and Oncology | Admitting: Hematology and Oncology

## 2019-08-21 ENCOUNTER — Inpatient Hospital Stay: Payer: 59

## 2019-08-21 DIAGNOSIS — D6942 Congenital and hereditary thrombocytopenia purpura: Secondary | ICD-10-CM

## 2019-08-21 NOTE — Assessment & Plan Note (Signed)
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 She tells me that her platelet counts ranged from 60-90,000. Her problems with bleeding were severe during menarche but have subsequently improved after being treated with birth control pills. She does not have any current problems with bruising or bleeding.  Prior workup: An extensive workup by Dr. Melvenia Beam (in [place]) in the past showed no qualitative platelet defect (normal platelet aggregation testing) and no evidence of ITP. Bone marrow testing in the past has shown normal megakaryocytes  Differential diagnosisfor congenital thrombocytopenia as is extremely wide which include amegakaryocytic thrombocytopenia especially because of the fact that she has hearing impairment. Other X-linked thrombocytopenias are also possible as well as MYH-9 -related diseases. -------------------------------------------------------------------------------------------------------------------------------------------- Lab review: We can continue to monitor her platelet levels.  Return to clinic in 6 months with labs and follow-up

## 2019-08-21 NOTE — Progress Notes (Signed)
  This encounter was created in error - please disregard.

## 2020-02-13 ENCOUNTER — Ambulatory Visit: Payer: Medicaid Other | Admitting: Obstetrics & Gynecology

## 2021-02-01 IMAGING — CT CT ANGIO CHEST
2 of 9 series · 13 of 36 positions shown · IV contrast (APPLIED)
Comparison: CT 06/03/2018

CLINICAL DATA: The following report is an over-read performed by
07/11/2019. This over-read does not include interpretation of cardiac
or coronary anatomy or pathology. The CTA interpretation by the
cardiologist is attached.

[Series 9: 5-95% · axial · 0.39mm/px · z∈[-193,-11]mm · 10 of 3710 slices shown]
[im 338/3710  lung]
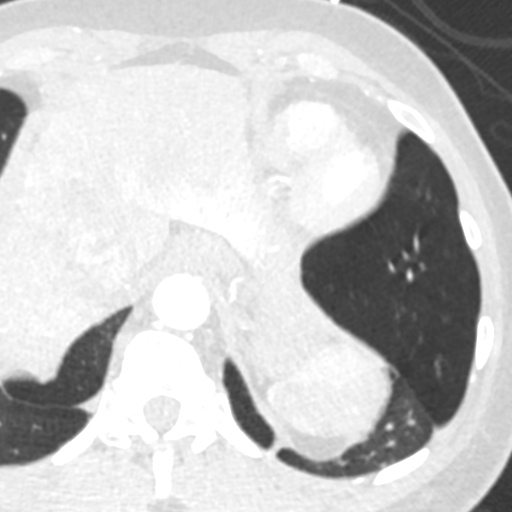
[im 675/3710  mediastinal]
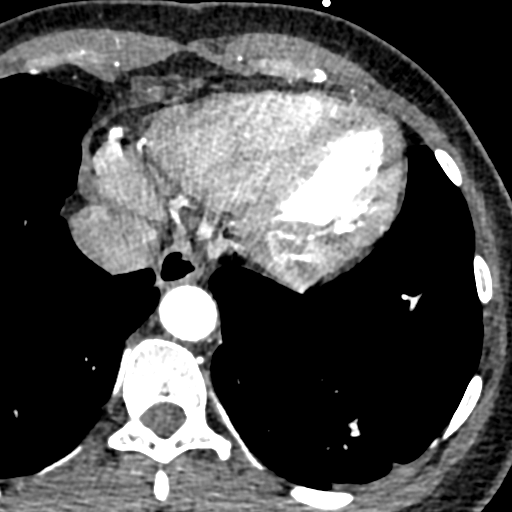
[im 1012/3710  lung]
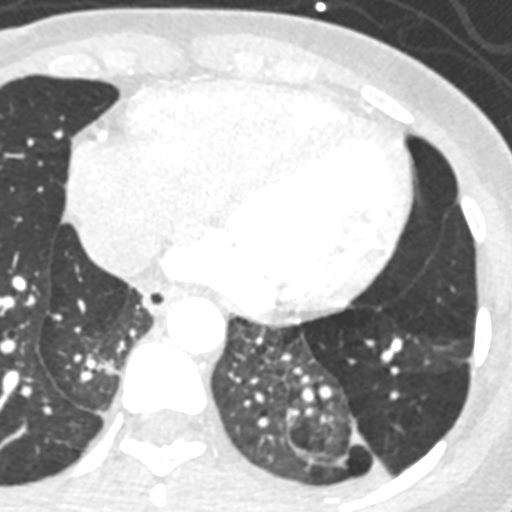
[im 1349/3710  mediastinal]
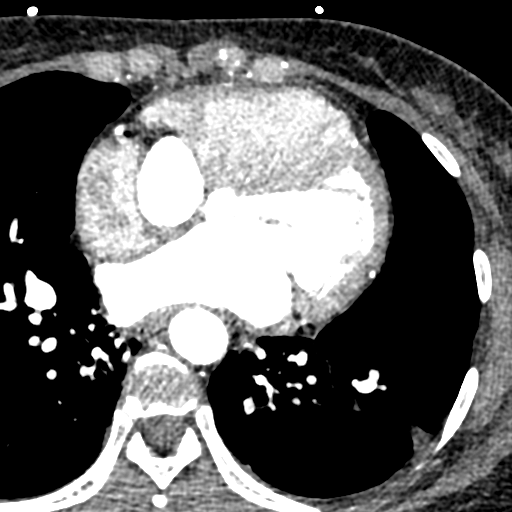
[im 1686/3710  lung]
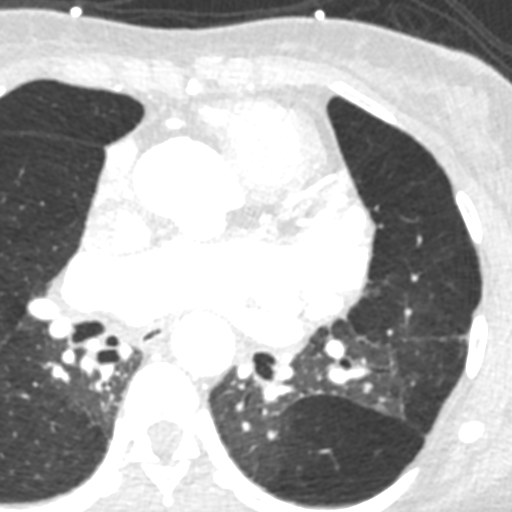
[im 2024/3710  mediastinal]
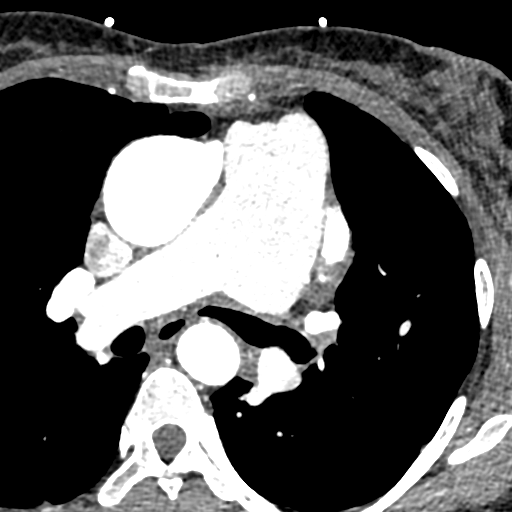
[im 2361/3710  lung]
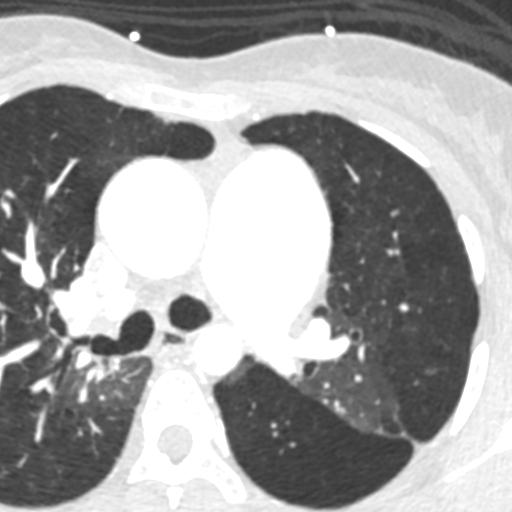
[im 2698/3710  mediastinal]
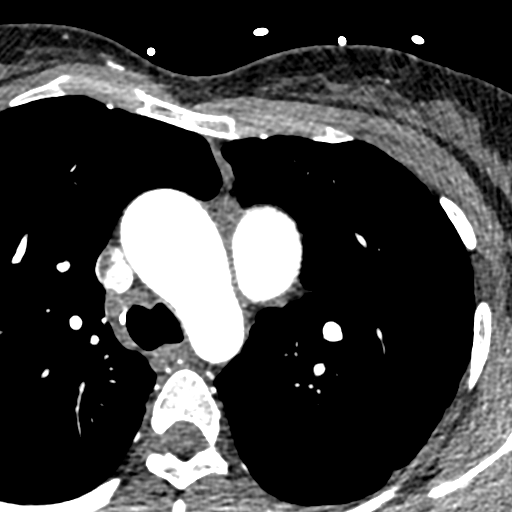
[im 3035/3710  lung]
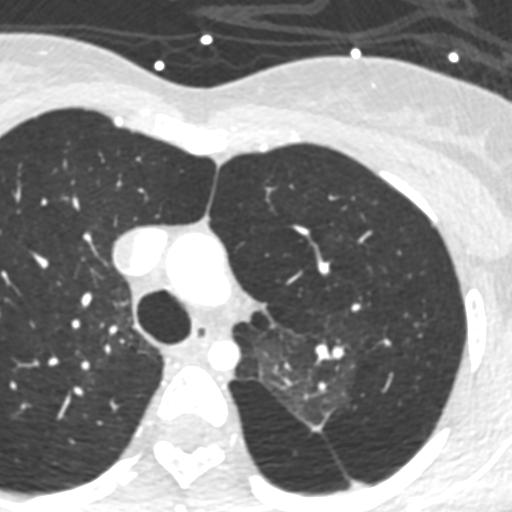
[im 3372/3710  mediastinal]
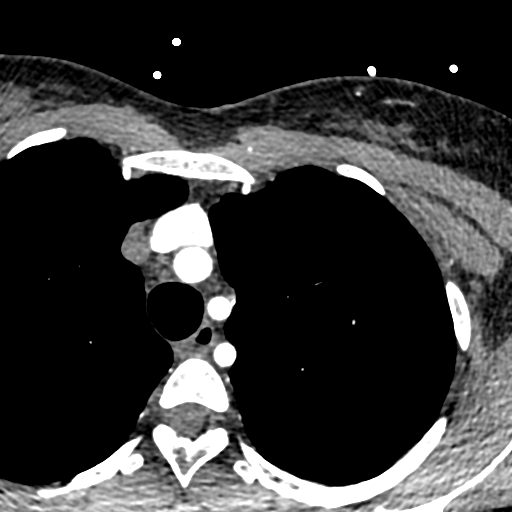

[Series 12: cor 72 % · coronal · 0.44mm/px · 3 of 122 slices shown]
[im 25/122  mediastinal]
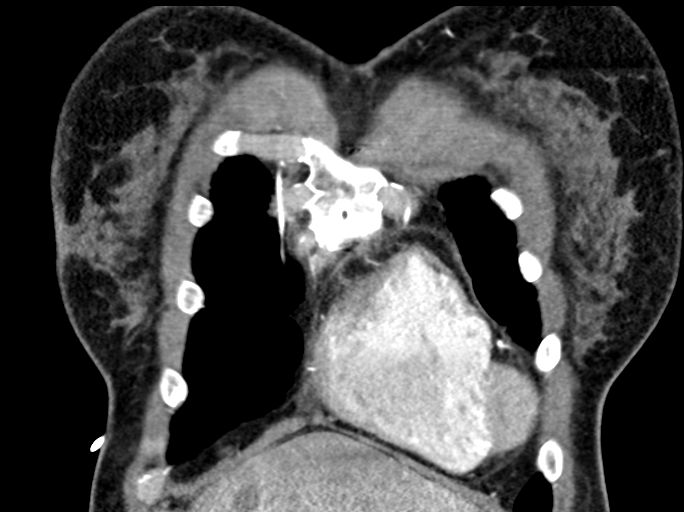
[im 49/122  mediastinal]
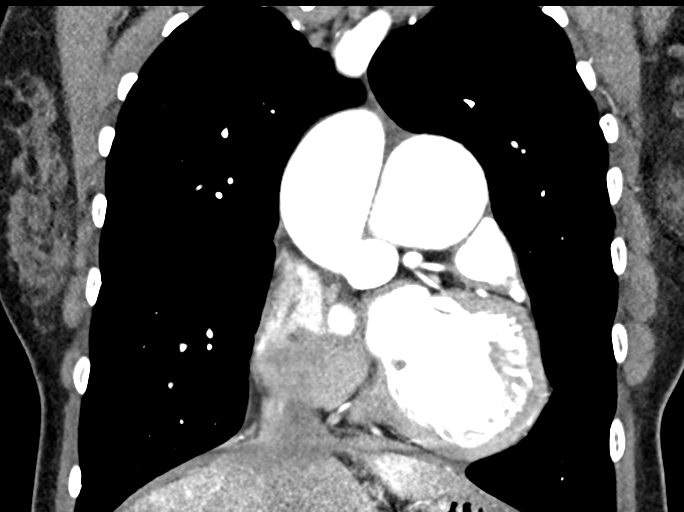
[im 73/122  mediastinal]
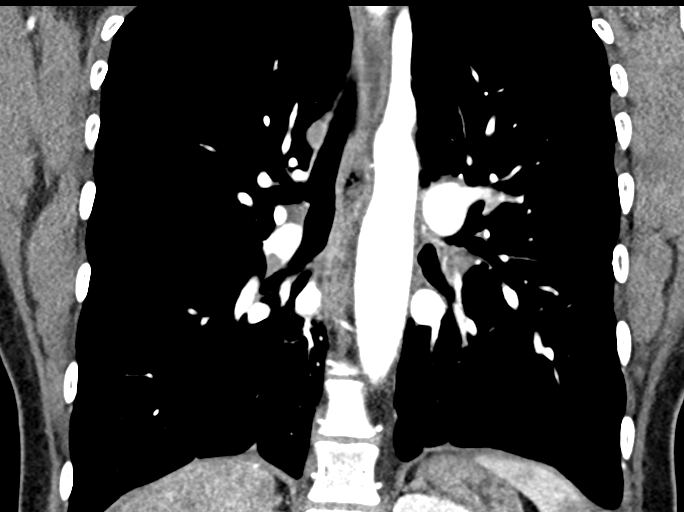

[13 of 36 positions shown; findings below may reference images not displayed]

FINDINGS: Limited view of the lung parenchyma demonstrates bronchiectasis and
atelectasis/dysplasia in the lower lobes. Hyperexpansion of the
upper lobes. No suspicious nodularity. Airways are normal.

Limited view of the mediastinum demonstrates no adenopathy.
Esophagus normal.

Limited view of the upper abdomen unremarkable.

Limited view of the skeleton and chest wall is unremarkable. Midline
sternotomy wires noted.
IMPRESSION: Bronchopulmonary dysplasia in the lower lobes. No change from
comparison. No acute findings.
